# Patient Record
Sex: Male | Born: 1945 | Race: White | Hispanic: No | Marital: Married | Smoking: Former smoker
Health system: Southern US, Community
[De-identification: ages and names within clinical notes are randomized; demographics above are authoritative.]

## PROBLEM LIST (undated history)

## (undated) DIAGNOSIS — C679 Malignant neoplasm of bladder, unspecified: Secondary | ICD-10-CM

## (undated) DIAGNOSIS — G9340 Encephalopathy, unspecified: Secondary | ICD-10-CM

## (undated) DIAGNOSIS — E871 Hypo-osmolality and hyponatremia: Secondary | ICD-10-CM

## (undated) HISTORY — DX: Malignant neoplasm of bladder, unspecified: C67.9

## (undated) HISTORY — PX: TRANSURETHRAL RESECTION OF BLADDER TUMOR: SHX2575

---

## 2012-09-18 ENCOUNTER — Emergency Department: Payer: Self-pay | Admitting: Unknown Physician Specialty

## 2012-09-18 DIAGNOSIS — N134 Hydroureter: Secondary | ICD-10-CM | POA: Diagnosis not present

## 2012-09-18 DIAGNOSIS — R109 Unspecified abdominal pain: Secondary | ICD-10-CM | POA: Diagnosis not present

## 2012-09-18 DIAGNOSIS — F17200 Nicotine dependence, unspecified, uncomplicated: Secondary | ICD-10-CM | POA: Diagnosis not present

## 2012-09-18 DIAGNOSIS — C679 Malignant neoplasm of bladder, unspecified: Secondary | ICD-10-CM | POA: Diagnosis not present

## 2012-09-18 DIAGNOSIS — R319 Hematuria, unspecified: Secondary | ICD-10-CM | POA: Diagnosis not present

## 2012-09-18 LAB — COMPREHENSIVE METABOLIC PANEL
Albumin: 3.7 g/dL (ref 3.4–5.0)
Anion Gap: 11 (ref 7–16)
Calcium, Total: 8.9 mg/dL (ref 8.5–10.1)
Co2: 26 mmol/L (ref 21–32)
Creatinine: 2 mg/dL — ABNORMAL HIGH (ref 0.60–1.30)
EGFR (African American): 39 — ABNORMAL LOW
EGFR (Non-African Amer.): 34 — ABNORMAL LOW
Glucose: 111 mg/dL — ABNORMAL HIGH (ref 65–99)
Osmolality: 285 (ref 275–301)
Potassium: 3.7 mmol/L (ref 3.5–5.1)
SGOT(AST): 19 U/L (ref 15–37)
SGPT (ALT): 24 U/L (ref 12–78)
Sodium: 141 mmol/L (ref 136–145)

## 2012-09-18 LAB — URINALYSIS, COMPLETE
Bilirubin,UR: NEGATIVE
Glucose,UR: NEGATIVE mg/dL (ref 0–75)
Ketone: NEGATIVE
Nitrite: NEGATIVE
Protein: 75
Specific Gravity: 1.03 (ref 1.003–1.030)
Squamous Epithelial: 1
WBC UR: 72 /HPF (ref 0–5)

## 2012-09-18 LAB — LIPASE, BLOOD: Lipase: 72 U/L — ABNORMAL LOW (ref 73–393)

## 2012-09-18 LAB — CBC
HGB: 12.5 g/dL — ABNORMAL LOW (ref 13.0–18.0)
MCH: 30.7 pg (ref 26.0–34.0)
MCHC: 34.5 g/dL (ref 32.0–36.0)
MCV: 89 fL (ref 80–100)
Platelet: 396 10*3/uL (ref 150–440)
RDW: 14.2 % (ref 11.5–14.5)
WBC: 12 10*3/uL — ABNORMAL HIGH (ref 3.8–10.6)

## 2012-09-20 LAB — URINE CULTURE

## 2012-09-28 DIAGNOSIS — N1330 Unspecified hydronephrosis: Secondary | ICD-10-CM | POA: Diagnosis not present

## 2012-09-28 DIAGNOSIS — C679 Malignant neoplasm of bladder, unspecified: Secondary | ICD-10-CM | POA: Diagnosis not present

## 2012-09-28 DIAGNOSIS — R310 Gross hematuria: Secondary | ICD-10-CM | POA: Diagnosis not present

## 2012-10-07 ENCOUNTER — Ambulatory Visit: Payer: Self-pay | Admitting: Urology

## 2012-10-07 DIAGNOSIS — Z01812 Encounter for preprocedural laboratory examination: Secondary | ICD-10-CM | POA: Diagnosis not present

## 2012-10-07 DIAGNOSIS — C679 Malignant neoplasm of bladder, unspecified: Secondary | ICD-10-CM | POA: Diagnosis not present

## 2012-10-07 DIAGNOSIS — N1330 Unspecified hydronephrosis: Secondary | ICD-10-CM | POA: Diagnosis not present

## 2012-10-07 DIAGNOSIS — R310 Gross hematuria: Secondary | ICD-10-CM | POA: Diagnosis not present

## 2012-10-07 DIAGNOSIS — Z01810 Encounter for preprocedural cardiovascular examination: Secondary | ICD-10-CM | POA: Diagnosis not present

## 2012-10-13 ENCOUNTER — Ambulatory Visit: Payer: Self-pay | Admitting: Urology

## 2012-10-13 DIAGNOSIS — F17200 Nicotine dependence, unspecified, uncomplicated: Secondary | ICD-10-CM | POA: Diagnosis not present

## 2012-10-13 DIAGNOSIS — Z8249 Family history of ischemic heart disease and other diseases of the circulatory system: Secondary | ICD-10-CM | POA: Diagnosis not present

## 2012-10-13 DIAGNOSIS — N1330 Unspecified hydronephrosis: Secondary | ICD-10-CM | POA: Diagnosis not present

## 2012-10-13 DIAGNOSIS — C679 Malignant neoplasm of bladder, unspecified: Secondary | ICD-10-CM | POA: Diagnosis not present

## 2012-10-13 DIAGNOSIS — Z79899 Other long term (current) drug therapy: Secondary | ICD-10-CM | POA: Diagnosis not present

## 2012-10-13 DIAGNOSIS — C672 Malignant neoplasm of lateral wall of bladder: Secondary | ICD-10-CM | POA: Diagnosis not present

## 2012-10-13 DIAGNOSIS — D494 Neoplasm of unspecified behavior of bladder: Secondary | ICD-10-CM | POA: Diagnosis not present

## 2012-10-13 DIAGNOSIS — Z8052 Family history of malignant neoplasm of bladder: Secondary | ICD-10-CM | POA: Diagnosis not present

## 2012-10-14 DIAGNOSIS — C679 Malignant neoplasm of bladder, unspecified: Secondary | ICD-10-CM | POA: Diagnosis not present

## 2012-10-18 ENCOUNTER — Ambulatory Visit: Payer: Self-pay | Admitting: Urology

## 2012-10-18 DIAGNOSIS — C679 Malignant neoplasm of bladder, unspecified: Secondary | ICD-10-CM | POA: Diagnosis not present

## 2012-10-18 DIAGNOSIS — K8020 Calculus of gallbladder without cholecystitis without obstruction: Secondary | ICD-10-CM | POA: Diagnosis not present

## 2012-10-18 DIAGNOSIS — R935 Abnormal findings on diagnostic imaging of other abdominal regions, including retroperitoneum: Secondary | ICD-10-CM | POA: Diagnosis not present

## 2012-10-18 DIAGNOSIS — N1330 Unspecified hydronephrosis: Secondary | ICD-10-CM | POA: Diagnosis not present

## 2012-10-19 DIAGNOSIS — C679 Malignant neoplasm of bladder, unspecified: Secondary | ICD-10-CM | POA: Diagnosis not present

## 2012-10-29 DIAGNOSIS — C679 Malignant neoplasm of bladder, unspecified: Secondary | ICD-10-CM | POA: Diagnosis not present

## 2012-10-29 DIAGNOSIS — N1330 Unspecified hydronephrosis: Secondary | ICD-10-CM | POA: Diagnosis not present

## 2012-11-02 ENCOUNTER — Ambulatory Visit: Payer: Self-pay | Admitting: Urology

## 2012-11-02 DIAGNOSIS — C679 Malignant neoplasm of bladder, unspecified: Secondary | ICD-10-CM | POA: Diagnosis not present

## 2012-11-18 DIAGNOSIS — N134 Hydroureter: Secondary | ICD-10-CM | POA: Diagnosis not present

## 2012-11-18 DIAGNOSIS — Z79899 Other long term (current) drug therapy: Secondary | ICD-10-CM | POA: Diagnosis not present

## 2012-11-18 DIAGNOSIS — J9819 Other pulmonary collapse: Secondary | ICD-10-CM | POA: Diagnosis not present

## 2012-11-18 DIAGNOSIS — C679 Malignant neoplasm of bladder, unspecified: Secondary | ICD-10-CM | POA: Diagnosis not present

## 2012-11-18 DIAGNOSIS — N1330 Unspecified hydronephrosis: Secondary | ICD-10-CM | POA: Diagnosis not present

## 2012-11-18 LAB — PATHOLOGY REPORT

## 2012-11-22 ENCOUNTER — Ambulatory Visit: Payer: Self-pay | Admitting: Oncology

## 2012-11-22 DIAGNOSIS — R634 Abnormal weight loss: Secondary | ICD-10-CM | POA: Diagnosis not present

## 2012-11-22 DIAGNOSIS — C679 Malignant neoplasm of bladder, unspecified: Secondary | ICD-10-CM | POA: Diagnosis not present

## 2012-11-22 DIAGNOSIS — R630 Anorexia: Secondary | ICD-10-CM | POA: Diagnosis not present

## 2012-11-22 DIAGNOSIS — F17200 Nicotine dependence, unspecified, uncomplicated: Secondary | ICD-10-CM | POA: Diagnosis not present

## 2012-11-22 DIAGNOSIS — Z79899 Other long term (current) drug therapy: Secondary | ICD-10-CM | POA: Diagnosis not present

## 2012-11-24 ENCOUNTER — Ambulatory Visit: Payer: Self-pay | Admitting: Oncology

## 2012-11-24 DIAGNOSIS — F17200 Nicotine dependence, unspecified, uncomplicated: Secondary | ICD-10-CM | POA: Diagnosis not present

## 2012-11-24 DIAGNOSIS — R634 Abnormal weight loss: Secondary | ICD-10-CM | POA: Diagnosis not present

## 2012-11-24 DIAGNOSIS — R630 Anorexia: Secondary | ICD-10-CM | POA: Diagnosis not present

## 2012-11-24 DIAGNOSIS — Z79899 Other long term (current) drug therapy: Secondary | ICD-10-CM | POA: Diagnosis not present

## 2012-11-24 DIAGNOSIS — C679 Malignant neoplasm of bladder, unspecified: Secondary | ICD-10-CM | POA: Diagnosis not present

## 2012-11-25 DIAGNOSIS — C679 Malignant neoplasm of bladder, unspecified: Secondary | ICD-10-CM | POA: Diagnosis not present

## 2012-12-06 ENCOUNTER — Ambulatory Visit: Payer: Self-pay | Admitting: Urology

## 2012-12-06 DIAGNOSIS — F17200 Nicotine dependence, unspecified, uncomplicated: Secondary | ICD-10-CM | POA: Diagnosis not present

## 2012-12-06 DIAGNOSIS — Z79899 Other long term (current) drug therapy: Secondary | ICD-10-CM | POA: Diagnosis not present

## 2012-12-06 DIAGNOSIS — N1330 Unspecified hydronephrosis: Secondary | ICD-10-CM | POA: Diagnosis not present

## 2012-12-06 DIAGNOSIS — C679 Malignant neoplasm of bladder, unspecified: Secondary | ICD-10-CM | POA: Diagnosis not present

## 2012-12-06 LAB — APTT: Activated PTT: 31.7 secs (ref 23.6–35.9)

## 2012-12-06 LAB — PROTIME-INR: INR: 0.9

## 2012-12-15 ENCOUNTER — Ambulatory Visit: Payer: Self-pay | Admitting: Oncology

## 2012-12-15 DIAGNOSIS — G893 Neoplasm related pain (acute) (chronic): Secondary | ICD-10-CM | POA: Diagnosis not present

## 2012-12-15 DIAGNOSIS — C679 Malignant neoplasm of bladder, unspecified: Secondary | ICD-10-CM | POA: Diagnosis not present

## 2012-12-15 DIAGNOSIS — Z936 Other artificial openings of urinary tract status: Secondary | ICD-10-CM | POA: Diagnosis not present

## 2012-12-15 DIAGNOSIS — Z23 Encounter for immunization: Secondary | ICD-10-CM | POA: Diagnosis not present

## 2012-12-15 DIAGNOSIS — N289 Disorder of kidney and ureter, unspecified: Secondary | ICD-10-CM | POA: Diagnosis not present

## 2012-12-15 DIAGNOSIS — Z5111 Encounter for antineoplastic chemotherapy: Secondary | ICD-10-CM | POA: Diagnosis not present

## 2012-12-15 DIAGNOSIS — Z79899 Other long term (current) drug therapy: Secondary | ICD-10-CM | POA: Diagnosis not present

## 2012-12-23 DIAGNOSIS — G893 Neoplasm related pain (acute) (chronic): Secondary | ICD-10-CM | POA: Diagnosis not present

## 2012-12-23 DIAGNOSIS — C679 Malignant neoplasm of bladder, unspecified: Secondary | ICD-10-CM | POA: Diagnosis not present

## 2012-12-23 LAB — CBC CANCER CENTER
Basophil #: 0.1 x10 3/mm (ref 0.0–0.1)
Eosinophil %: 2.5 %
Lymphocyte #: 1.3 x10 3/mm (ref 1.0–3.6)
Lymphocyte %: 10.5 %
MCHC: 34 g/dL (ref 32.0–36.0)
MCV: 87 fL (ref 80–100)
Monocyte %: 6.8 %
Neutrophil #: 9.7 x10 3/mm — ABNORMAL HIGH (ref 1.4–6.5)
Neutrophil %: 79.2 %
Platelet: 534 x10 3/mm — ABNORMAL HIGH (ref 150–440)
RBC: 3.87 10*6/uL — ABNORMAL LOW (ref 4.40–5.90)
RDW: 14.4 % (ref 11.5–14.5)

## 2012-12-23 LAB — COMPREHENSIVE METABOLIC PANEL
Anion Gap: 9 (ref 7–16)
BUN: 18 mg/dL (ref 7–18)
Calcium, Total: 9.3 mg/dL (ref 8.5–10.1)
Chloride: 99 mmol/L (ref 98–107)
Co2: 28 mmol/L (ref 21–32)
Creatinine: 1.58 mg/dL — ABNORMAL HIGH (ref 0.60–1.30)
EGFR (African American): 52 — ABNORMAL LOW
Potassium: 4.2 mmol/L (ref 3.5–5.1)
SGPT (ALT): 22 U/L (ref 12–78)
Sodium: 136 mmol/L (ref 136–145)
Total Protein: 7.7 g/dL (ref 6.4–8.2)

## 2012-12-29 DIAGNOSIS — C679 Malignant neoplasm of bladder, unspecified: Secondary | ICD-10-CM | POA: Diagnosis not present

## 2012-12-29 DIAGNOSIS — G893 Neoplasm related pain (acute) (chronic): Secondary | ICD-10-CM | POA: Diagnosis not present

## 2012-12-29 LAB — BASIC METABOLIC PANEL
BUN: 22 mg/dL — ABNORMAL HIGH (ref 7–18)
Calcium, Total: 9.6 mg/dL (ref 8.5–10.1)
Chloride: 99 mmol/L (ref 98–107)
Creatinine: 1.68 mg/dL — ABNORMAL HIGH (ref 0.60–1.30)
EGFR (Non-African Amer.): 42 — ABNORMAL LOW
Glucose: 111 mg/dL — ABNORMAL HIGH (ref 65–99)
Potassium: 3.7 mmol/L (ref 3.5–5.1)
Sodium: 137 mmol/L (ref 136–145)

## 2013-01-05 DIAGNOSIS — N289 Disorder of kidney and ureter, unspecified: Secondary | ICD-10-CM | POA: Diagnosis not present

## 2013-01-05 DIAGNOSIS — C679 Malignant neoplasm of bladder, unspecified: Secondary | ICD-10-CM | POA: Diagnosis not present

## 2013-01-05 LAB — COMPREHENSIVE METABOLIC PANEL
Anion Gap: 8 (ref 7–16)
BUN: 23 mg/dL — ABNORMAL HIGH (ref 7–18)
Calcium, Total: 9.5 mg/dL (ref 8.5–10.1)
Chloride: 99 mmol/L (ref 98–107)
Co2: 28 mmol/L (ref 21–32)
Creatinine: 1.53 mg/dL — ABNORMAL HIGH (ref 0.60–1.30)
Glucose: 120 mg/dL — ABNORMAL HIGH (ref 65–99)
Osmolality: 275 (ref 275–301)
Potassium: 4 mmol/L (ref 3.5–5.1)
SGOT(AST): 12 U/L — ABNORMAL LOW (ref 15–37)
SGPT (ALT): 19 U/L (ref 12–78)

## 2013-01-05 LAB — CBC CANCER CENTER
Basophil #: 0.1 x10 3/mm (ref 0.0–0.1)
Eosinophil %: 4.7 %
HGB: 10.4 g/dL — ABNORMAL LOW (ref 13.0–18.0)
Lymphocyte #: 1.3 x10 3/mm (ref 1.0–3.6)
MCH: 28.2 pg (ref 26.0–34.0)
MCHC: 32.7 g/dL (ref 32.0–36.0)
MCV: 86 fL (ref 80–100)
Monocyte %: 6.5 %
Neutrophil #: 6.4 x10 3/mm (ref 1.4–6.5)
Neutrophil %: 72.9 %
Platelet: 343 x10 3/mm (ref 150–440)
RBC: 3.71 10*6/uL — ABNORMAL LOW (ref 4.40–5.90)
WBC: 8.8 x10 3/mm (ref 3.8–10.6)

## 2013-01-15 ENCOUNTER — Ambulatory Visit: Payer: Self-pay | Admitting: Oncology

## 2013-01-15 DIAGNOSIS — C679 Malignant neoplasm of bladder, unspecified: Secondary | ICD-10-CM | POA: Diagnosis not present

## 2013-01-15 DIAGNOSIS — Z936 Other artificial openings of urinary tract status: Secondary | ICD-10-CM | POA: Diagnosis not present

## 2013-01-15 DIAGNOSIS — Z79899 Other long term (current) drug therapy: Secondary | ICD-10-CM | POA: Diagnosis not present

## 2013-01-15 DIAGNOSIS — N289 Disorder of kidney and ureter, unspecified: Secondary | ICD-10-CM | POA: Diagnosis not present

## 2013-01-15 DIAGNOSIS — R630 Anorexia: Secondary | ICD-10-CM | POA: Diagnosis not present

## 2013-01-15 DIAGNOSIS — G893 Neoplasm related pain (acute) (chronic): Secondary | ICD-10-CM | POA: Diagnosis not present

## 2013-01-15 DIAGNOSIS — Z5111 Encounter for antineoplastic chemotherapy: Secondary | ICD-10-CM | POA: Diagnosis not present

## 2013-01-15 DIAGNOSIS — F17200 Nicotine dependence, unspecified, uncomplicated: Secondary | ICD-10-CM | POA: Diagnosis not present

## 2013-01-19 DIAGNOSIS — C679 Malignant neoplasm of bladder, unspecified: Secondary | ICD-10-CM | POA: Diagnosis not present

## 2013-01-19 LAB — BASIC METABOLIC PANEL
Anion Gap: 10 (ref 7–16)
Chloride: 97 mmol/L — ABNORMAL LOW (ref 98–107)
Co2: 28 mmol/L (ref 21–32)
EGFR (African American): 55 — ABNORMAL LOW
EGFR (Non-African Amer.): 48 — ABNORMAL LOW
Osmolality: 275 (ref 275–301)

## 2013-01-19 LAB — CBC CANCER CENTER
Basophil #: 0.1 x10 3/mm (ref 0.0–0.1)
Basophil %: 1.4 %
Eosinophil %: 4.1 %
HCT: 29.4 % — ABNORMAL LOW (ref 40.0–52.0)
HGB: 10.1 g/dL — ABNORMAL LOW (ref 13.0–18.0)
Lymphocyte #: 1.5 x10 3/mm (ref 1.0–3.6)
Lymphocyte %: 18.1 %
Monocyte #: 1 x10 3/mm (ref 0.2–1.0)
Neutrophil #: 5.5 x10 3/mm (ref 1.4–6.5)
Neutrophil %: 64.6 %
Platelet: 778 x10 3/mm — ABNORMAL HIGH (ref 150–440)
RDW: 14.6 % — ABNORMAL HIGH (ref 11.5–14.5)
WBC: 8.4 x10 3/mm (ref 3.8–10.6)

## 2013-02-02 LAB — COMPREHENSIVE METABOLIC PANEL
Alkaline Phosphatase: 110 U/L (ref 50–136)
Anion Gap: 9 (ref 7–16)
Bilirubin,Total: 0.2 mg/dL (ref 0.2–1.0)
Creatinine: 1.47 mg/dL — ABNORMAL HIGH (ref 0.60–1.30)
EGFR (African American): 57 — ABNORMAL LOW
Glucose: 131 mg/dL — ABNORMAL HIGH (ref 65–99)
Potassium: 4.3 mmol/L (ref 3.5–5.1)
SGPT (ALT): 16 U/L (ref 12–78)
Sodium: 134 mmol/L — ABNORMAL LOW (ref 136–145)
Total Protein: 7.7 g/dL (ref 6.4–8.2)

## 2013-02-02 LAB — CBC CANCER CENTER
Basophil #: 0.2 x10 3/mm — ABNORMAL HIGH (ref 0.0–0.1)
Basophil %: 1.3 %
Eosinophil %: 3.6 %
HCT: 32.4 % — ABNORMAL LOW (ref 40.0–52.0)
HGB: 10.7 g/dL — ABNORMAL LOW (ref 13.0–18.0)
Lymphocyte #: 2 x10 3/mm (ref 1.0–3.6)
Lymphocyte %: 13.4 %
MCH: 28 pg (ref 26.0–34.0)
MCHC: 32.9 g/dL (ref 32.0–36.0)
MCV: 85 fL (ref 80–100)
Monocyte #: 1.1 x10 3/mm — ABNORMAL HIGH (ref 0.2–1.0)
Neutrophil #: 10.8 x10 3/mm — ABNORMAL HIGH (ref 1.4–6.5)
RBC: 3.8 10*6/uL — ABNORMAL LOW (ref 4.40–5.90)
RDW: 15.6 % — ABNORMAL HIGH (ref 11.5–14.5)
WBC: 14.6 x10 3/mm — ABNORMAL HIGH (ref 3.8–10.6)

## 2013-02-09 DIAGNOSIS — C679 Malignant neoplasm of bladder, unspecified: Secondary | ICD-10-CM | POA: Diagnosis not present

## 2013-02-09 LAB — BASIC METABOLIC PANEL
Anion Gap: 10 (ref 7–16)
BUN: 19 mg/dL — ABNORMAL HIGH (ref 7–18)
Creatinine: 1.41 mg/dL — ABNORMAL HIGH (ref 0.60–1.30)
EGFR (Non-African Amer.): 52 — ABNORMAL LOW
Glucose: 126 mg/dL — ABNORMAL HIGH (ref 65–99)
Osmolality: 272 (ref 275–301)
Potassium: 4.2 mmol/L (ref 3.5–5.1)
Sodium: 134 mmol/L — ABNORMAL LOW (ref 136–145)

## 2013-02-09 LAB — CBC CANCER CENTER
Basophil #: 0.1 x10 3/mm (ref 0.0–0.1)
Eosinophil #: 0.6 x10 3/mm (ref 0.0–0.7)
Eosinophil %: 4.8 %
HGB: 10 g/dL — ABNORMAL LOW (ref 13.0–18.0)
Lymphocyte #: 1.6 x10 3/mm (ref 1.0–3.6)
MCH: 28.3 pg (ref 26.0–34.0)
MCHC: 33.1 g/dL (ref 32.0–36.0)
Monocyte %: 6.5 %
Neutrophil #: 9.9 x10 3/mm — ABNORMAL HIGH (ref 1.4–6.5)
Platelet: 384 x10 3/mm (ref 150–440)
RDW: 15.2 % — ABNORMAL HIGH (ref 11.5–14.5)

## 2013-02-12 ENCOUNTER — Ambulatory Visit: Payer: Self-pay | Admitting: Oncology

## 2013-02-12 DIAGNOSIS — G893 Neoplasm related pain (acute) (chronic): Secondary | ICD-10-CM | POA: Diagnosis not present

## 2013-02-12 DIAGNOSIS — C679 Malignant neoplasm of bladder, unspecified: Secondary | ICD-10-CM | POA: Diagnosis not present

## 2013-02-12 DIAGNOSIS — Z936 Other artificial openings of urinary tract status: Secondary | ICD-10-CM | POA: Diagnosis not present

## 2013-02-12 DIAGNOSIS — Z5111 Encounter for antineoplastic chemotherapy: Secondary | ICD-10-CM | POA: Diagnosis not present

## 2013-02-12 DIAGNOSIS — N289 Disorder of kidney and ureter, unspecified: Secondary | ICD-10-CM | POA: Diagnosis not present

## 2013-02-12 DIAGNOSIS — F17200 Nicotine dependence, unspecified, uncomplicated: Secondary | ICD-10-CM | POA: Diagnosis not present

## 2013-02-12 DIAGNOSIS — Z79899 Other long term (current) drug therapy: Secondary | ICD-10-CM | POA: Diagnosis not present

## 2013-02-16 LAB — CBC CANCER CENTER
Basophil %: 2.6 %
Eosinophil #: 0.4 x10 3/mm (ref 0.0–0.7)
HCT: 27.5 % — ABNORMAL LOW (ref 40.0–52.0)
HGB: 9.4 g/dL — ABNORMAL LOW (ref 13.0–18.0)
Lymphocyte %: 19.2 %
MCH: 29.2 pg (ref 26.0–34.0)
MCHC: 34.1 g/dL (ref 32.0–36.0)
Monocyte #: 0.5 x10 3/mm (ref 0.2–1.0)
Neutrophil #: 5 x10 3/mm (ref 1.4–6.5)
RDW: 15.7 % — ABNORMAL HIGH (ref 11.5–14.5)

## 2013-02-16 LAB — BASIC METABOLIC PANEL
Calcium, Total: 9.5 mg/dL (ref 8.5–10.1)
Chloride: 96 mmol/L — ABNORMAL LOW (ref 98–107)
Creatinine: 1.4 mg/dL — ABNORMAL HIGH (ref 0.60–1.30)
EGFR (African American): >60
EGFR (Non-African Amer.): 52 — ABNORMAL LOW
Osmolality: 272 (ref 275–301)
Potassium: 4.2 mmol/L (ref 3.5–5.1)
Sodium: 134 mmol/L — ABNORMAL LOW (ref 136–145)

## 2013-03-02 LAB — COMPREHENSIVE METABOLIC PANEL
Albumin: 3 g/dL — ABNORMAL LOW (ref 3.4–5.0)
Anion Gap: 9 (ref 7–16)
BUN: 12 mg/dL (ref 7–18)
Calcium, Total: 9.4 mg/dL (ref 8.5–10.1)
Creatinine: 1.52 mg/dL — ABNORMAL HIGH (ref 0.60–1.30)
EGFR (African American): 55 — ABNORMAL LOW
Glucose: 116 mg/dL — ABNORMAL HIGH (ref 65–99)
Osmolality: 273 (ref 275–301)
Potassium: 4.2 mmol/L (ref 3.5–5.1)
SGOT(AST): 13 U/L — ABNORMAL LOW (ref 15–37)
SGPT (ALT): 15 U/L (ref 12–78)
Sodium: 136 mmol/L (ref 136–145)

## 2013-03-02 LAB — CBC CANCER CENTER
Basophil #: 0.1 x10 3/mm (ref 0.0–0.1)
Eosinophil #: 0.3 x10 3/mm (ref 0.0–0.7)
Eosinophil %: 4.1 %
HCT: 27.9 % — ABNORMAL LOW (ref 40.0–52.0)
HGB: 9.4 g/dL — ABNORMAL LOW (ref 13.0–18.0)
Lymphocyte %: 15.3 %
MCHC: 33.6 g/dL (ref 32.0–36.0)
Monocyte %: 13.1 %
Neutrophil #: 5.2 x10 3/mm (ref 1.4–6.5)
Platelet: 754 x10 3/mm — ABNORMAL HIGH (ref 150–440)
RBC: 3.2 10*6/uL — ABNORMAL LOW (ref 4.40–5.90)

## 2013-03-15 ENCOUNTER — Ambulatory Visit: Payer: Self-pay | Admitting: Oncology

## 2013-03-15 DIAGNOSIS — N289 Disorder of kidney and ureter, unspecified: Secondary | ICD-10-CM | POA: Diagnosis not present

## 2013-03-15 DIAGNOSIS — Z5111 Encounter for antineoplastic chemotherapy: Secondary | ICD-10-CM | POA: Diagnosis not present

## 2013-03-15 DIAGNOSIS — F17200 Nicotine dependence, unspecified, uncomplicated: Secondary | ICD-10-CM | POA: Diagnosis not present

## 2013-03-15 DIAGNOSIS — D494 Neoplasm of unspecified behavior of bladder: Secondary | ICD-10-CM | POA: Diagnosis not present

## 2013-03-15 DIAGNOSIS — Z936 Other artificial openings of urinary tract status: Secondary | ICD-10-CM | POA: Diagnosis not present

## 2013-03-15 DIAGNOSIS — G893 Neoplasm related pain (acute) (chronic): Secondary | ICD-10-CM | POA: Diagnosis not present

## 2013-03-15 DIAGNOSIS — C679 Malignant neoplasm of bladder, unspecified: Secondary | ICD-10-CM | POA: Diagnosis not present

## 2013-03-15 DIAGNOSIS — Z79899 Other long term (current) drug therapy: Secondary | ICD-10-CM | POA: Diagnosis not present

## 2013-03-16 DIAGNOSIS — G893 Neoplasm related pain (acute) (chronic): Secondary | ICD-10-CM | POA: Diagnosis not present

## 2013-03-16 DIAGNOSIS — N289 Disorder of kidney and ureter, unspecified: Secondary | ICD-10-CM | POA: Diagnosis not present

## 2013-03-16 DIAGNOSIS — C679 Malignant neoplasm of bladder, unspecified: Secondary | ICD-10-CM | POA: Diagnosis not present

## 2013-03-16 LAB — BASIC METABOLIC PANEL
Anion Gap: 8 (ref 7–16)
BUN: 9 mg/dL (ref 7–18)
Calcium, Total: 9.2 mg/dL (ref 8.5–10.1)
Chloride: 97 mmol/L — ABNORMAL LOW (ref 98–107)
Co2: 29 mmol/L (ref 21–32)
Creatinine: 1.45 mg/dL — ABNORMAL HIGH (ref 0.60–1.30)
EGFR (Non-African Amer.): 50 — ABNORMAL LOW
Glucose: 119 mg/dL — ABNORMAL HIGH (ref 65–99)
Osmolality: 268 (ref 275–301)
Sodium: 134 mmol/L — ABNORMAL LOW (ref 136–145)

## 2013-03-16 LAB — CBC CANCER CENTER
Basophil #: 0.1 x10 3/mm (ref 0.0–0.1)
Basophil %: 0.9 %
Eosinophil %: 3.6 %
HCT: 29.2 % — ABNORMAL LOW (ref 40.0–52.0)
HGB: 9.5 g/dL — ABNORMAL LOW (ref 13.0–18.0)
MCH: 28 pg (ref 26.0–34.0)
MCHC: 32.4 g/dL (ref 32.0–36.0)
MCV: 87 fL (ref 80–100)
Monocyte #: 1 x10 3/mm (ref 0.2–1.0)
Monocyte %: 9.8 %
Neutrophil #: 7.8 x10 3/mm — ABNORMAL HIGH (ref 1.4–6.5)
Neutrophil %: 75.1 %
Platelet: 414 x10 3/mm (ref 150–440)
RBC: 3.38 10*6/uL — ABNORMAL LOW (ref 4.40–5.90)
WBC: 10.4 x10 3/mm (ref 3.8–10.6)

## 2013-03-30 DIAGNOSIS — C679 Malignant neoplasm of bladder, unspecified: Secondary | ICD-10-CM | POA: Diagnosis not present

## 2013-03-30 DIAGNOSIS — G893 Neoplasm related pain (acute) (chronic): Secondary | ICD-10-CM | POA: Diagnosis not present

## 2013-03-30 LAB — COMPREHENSIVE METABOLIC PANEL
Anion Gap: 7 (ref 7–16)
Bilirubin,Total: 0.3 mg/dL (ref 0.2–1.0)
Calcium, Total: 8.7 mg/dL (ref 8.5–10.1)
Chloride: 99 mmol/L (ref 98–107)
Creatinine: 1.32 mg/dL — ABNORMAL HIGH (ref 0.60–1.30)
EGFR (Non-African Amer.): 56 — ABNORMAL LOW
Glucose: 114 mg/dL — ABNORMAL HIGH (ref 65–99)
Osmolality: 271 (ref 275–301)
Potassium: 4.2 mmol/L (ref 3.5–5.1)
SGOT(AST): 14 U/L — ABNORMAL LOW (ref 15–37)
SGPT (ALT): 18 U/L (ref 12–78)
Sodium: 135 mmol/L — ABNORMAL LOW (ref 136–145)

## 2013-03-30 LAB — CBC CANCER CENTER
Basophil #: 0.2 x10 3/mm — ABNORMAL HIGH (ref 0.0–0.1)
Eosinophil %: 14.3 %
HCT: 28.9 % — ABNORMAL LOW (ref 40.0–52.0)
HGB: 9.3 g/dL — ABNORMAL LOW (ref 13.0–18.0)
MCHC: 32.2 g/dL (ref 32.0–36.0)
MCV: 88 fL (ref 80–100)
Monocyte %: 14.4 %
Neutrophil %: 46.1 %
Platelet: 303 x10 3/mm (ref 150–440)

## 2013-04-06 DIAGNOSIS — C679 Malignant neoplasm of bladder, unspecified: Secondary | ICD-10-CM | POA: Diagnosis not present

## 2013-04-06 DIAGNOSIS — G893 Neoplasm related pain (acute) (chronic): Secondary | ICD-10-CM | POA: Diagnosis not present

## 2013-04-06 LAB — COMPREHENSIVE METABOLIC PANEL
Albumin: 3.2 g/dL — ABNORMAL LOW (ref 3.4–5.0)
Alkaline Phosphatase: 104 U/L (ref 50–136)
BUN: 14 mg/dL (ref 7–18)
Bilirubin,Total: 0.4 mg/dL (ref 0.2–1.0)
Calcium, Total: 9.2 mg/dL (ref 8.5–10.1)
Chloride: 100 mmol/L (ref 98–107)
Co2: 29 mmol/L (ref 21–32)
Creatinine: 1.37 mg/dL — ABNORMAL HIGH (ref 0.60–1.30)
EGFR (African American): >60
EGFR (Non-African Amer.): 54 — ABNORMAL LOW
Glucose: 113 mg/dL — ABNORMAL HIGH (ref 65–99)
Potassium: 4.2 mmol/L (ref 3.5–5.1)
SGPT (ALT): 16 U/L (ref 12–78)
Total Protein: 7 g/dL (ref 6.4–8.2)

## 2013-04-06 LAB — CBC CANCER CENTER
Basophil #: 0 x10 3/mm (ref 0.0–0.1)
Basophil %: 0.4 %
Eosinophil #: 0.5 x10 3/mm (ref 0.0–0.7)
Eosinophil %: 6.2 %
HCT: 28.8 % — ABNORMAL LOW (ref 40.0–52.0)
HGB: 9.3 g/dL — ABNORMAL LOW (ref 13.0–18.0)
Lymphocyte #: 1.4 x10 3/mm (ref 1.0–3.6)
MCHC: 32.4 g/dL (ref 32.0–36.0)
Monocyte #: 0.7 x10 3/mm (ref 0.2–1.0)
Neutrophil #: 4.9 x10 3/mm (ref 1.4–6.5)
Neutrophil %: 65.5 %
Platelet: 260 x10 3/mm (ref 150–440)
RBC: 3.27 10*6/uL — ABNORMAL LOW (ref 4.40–5.90)
RDW: 19.8 % — ABNORMAL HIGH (ref 11.5–14.5)
WBC: 7.4 x10 3/mm (ref 3.8–10.6)

## 2013-04-13 LAB — CBC CANCER CENTER
Basophil #: 0 x10 3/mm (ref 0.0–0.1)
Basophil %: 0.8 %
Eosinophil #: 0.7 x10 3/mm (ref 0.0–0.7)
Eosinophil %: 12.4 %
HCT: 28.4 % — ABNORMAL LOW (ref 40.0–52.0)
HGB: 9.4 g/dL — ABNORMAL LOW (ref 13.0–18.0)
Lymphocyte #: 1.2 x10 3/mm (ref 1.0–3.6)
Lymphocyte %: 22.3 %
MCHC: 33.1 g/dL (ref 32.0–36.0)
RBC: 3.22 10*6/uL — ABNORMAL LOW (ref 4.40–5.90)

## 2013-04-13 LAB — COMPREHENSIVE METABOLIC PANEL
Alkaline Phosphatase: 118 U/L (ref 50–136)
Anion Gap: 9 (ref 7–16)
BUN: 19 mg/dL — ABNORMAL HIGH (ref 7–18)
Calcium, Total: 9 mg/dL (ref 8.5–10.1)
Chloride: 100 mmol/L (ref 98–107)
Co2: 28 mmol/L (ref 21–32)
Creatinine: 1.25 mg/dL (ref 0.60–1.30)
EGFR (Non-African Amer.): >60
Glucose: 107 mg/dL — ABNORMAL HIGH (ref 65–99)
Osmolality: 277 (ref 275–301)
SGOT(AST): 19 U/L (ref 15–37)
Total Protein: 7 g/dL (ref 6.4–8.2)

## 2013-04-14 ENCOUNTER — Ambulatory Visit: Payer: Self-pay | Admitting: Oncology

## 2013-04-14 DIAGNOSIS — C679 Malignant neoplasm of bladder, unspecified: Secondary | ICD-10-CM | POA: Diagnosis not present

## 2013-04-14 DIAGNOSIS — K8020 Calculus of gallbladder without cholecystitis without obstruction: Secondary | ICD-10-CM | POA: Diagnosis not present

## 2013-04-14 DIAGNOSIS — N1330 Unspecified hydronephrosis: Secondary | ICD-10-CM | POA: Diagnosis not present

## 2013-04-21 DIAGNOSIS — Z79899 Other long term (current) drug therapy: Secondary | ICD-10-CM | POA: Diagnosis not present

## 2013-04-21 DIAGNOSIS — N134 Hydroureter: Secondary | ICD-10-CM | POA: Diagnosis not present

## 2013-04-21 DIAGNOSIS — N1330 Unspecified hydronephrosis: Secondary | ICD-10-CM | POA: Diagnosis not present

## 2013-04-21 DIAGNOSIS — C679 Malignant neoplasm of bladder, unspecified: Secondary | ICD-10-CM | POA: Diagnosis not present

## 2013-04-28 ENCOUNTER — Ambulatory Visit: Payer: Self-pay

## 2013-04-28 DIAGNOSIS — K5900 Constipation, unspecified: Secondary | ICD-10-CM | POA: Diagnosis not present

## 2013-04-28 DIAGNOSIS — K8020 Calculus of gallbladder without cholecystitis without obstruction: Secondary | ICD-10-CM | POA: Diagnosis not present

## 2013-04-28 DIAGNOSIS — R918 Other nonspecific abnormal finding of lung field: Secondary | ICD-10-CM | POA: Diagnosis not present

## 2013-04-28 DIAGNOSIS — C679 Malignant neoplasm of bladder, unspecified: Secondary | ICD-10-CM | POA: Diagnosis not present

## 2013-05-02 ENCOUNTER — Emergency Department: Payer: Self-pay | Admitting: Emergency Medicine

## 2013-05-02 DIAGNOSIS — Z79899 Other long term (current) drug therapy: Secondary | ICD-10-CM | POA: Diagnosis not present

## 2013-05-02 DIAGNOSIS — N390 Urinary tract infection, site not specified: Secondary | ICD-10-CM | POA: Diagnosis not present

## 2013-05-02 DIAGNOSIS — R339 Retention of urine, unspecified: Secondary | ICD-10-CM | POA: Diagnosis not present

## 2013-05-02 LAB — URINALYSIS, COMPLETE
Glucose,UR: NEGATIVE mg/dL (ref 0–75)
Ketone: NEGATIVE
Protein: 100
RBC,UR: 158 /HPF (ref 0–5)
WBC UR: 5274 /HPF (ref 0–5)

## 2013-05-04 DIAGNOSIS — M47814 Spondylosis without myelopathy or radiculopathy, thoracic region: Secondary | ICD-10-CM | POA: Diagnosis not present

## 2013-05-04 DIAGNOSIS — Z01818 Encounter for other preprocedural examination: Secondary | ICD-10-CM | POA: Diagnosis not present

## 2013-05-04 DIAGNOSIS — F17200 Nicotine dependence, unspecified, uncomplicated: Secondary | ICD-10-CM | POA: Diagnosis not present

## 2013-05-04 DIAGNOSIS — Z79899 Other long term (current) drug therapy: Secondary | ICD-10-CM | POA: Diagnosis not present

## 2013-05-04 DIAGNOSIS — Z01812 Encounter for preprocedural laboratory examination: Secondary | ICD-10-CM | POA: Diagnosis not present

## 2013-05-04 DIAGNOSIS — C679 Malignant neoplasm of bladder, unspecified: Secondary | ICD-10-CM | POA: Diagnosis not present

## 2013-05-04 DIAGNOSIS — Z8551 Personal history of malignant neoplasm of bladder: Secondary | ICD-10-CM | POA: Diagnosis not present

## 2013-05-04 DIAGNOSIS — M479 Spondylosis, unspecified: Secondary | ICD-10-CM | POA: Diagnosis not present

## 2013-05-04 DIAGNOSIS — N3289 Other specified disorders of bladder: Secondary | ICD-10-CM | POA: Diagnosis not present

## 2013-05-04 DIAGNOSIS — R9431 Abnormal electrocardiogram [ECG] [EKG]: Secondary | ICD-10-CM | POA: Diagnosis not present

## 2013-05-04 DIAGNOSIS — K8020 Calculus of gallbladder without cholecystitis without obstruction: Secondary | ICD-10-CM | POA: Diagnosis not present

## 2013-05-13 DIAGNOSIS — E871 Hypo-osmolality and hyponatremia: Secondary | ICD-10-CM | POA: Diagnosis present

## 2013-05-13 DIAGNOSIS — C679 Malignant neoplasm of bladder, unspecified: Secondary | ICD-10-CM | POA: Diagnosis not present

## 2013-05-13 DIAGNOSIS — Z9889 Other specified postprocedural states: Secondary | ICD-10-CM | POA: Diagnosis not present

## 2013-05-13 DIAGNOSIS — K6389 Other specified diseases of intestine: Secondary | ICD-10-CM | POA: Diagnosis not present

## 2013-05-13 DIAGNOSIS — R112 Nausea with vomiting, unspecified: Secondary | ICD-10-CM | POA: Diagnosis present

## 2013-05-13 DIAGNOSIS — Z79899 Other long term (current) drug therapy: Secondary | ICD-10-CM | POA: Diagnosis not present

## 2013-05-13 HISTORY — PX: OTHER SURGICAL HISTORY: SHX169

## 2013-05-30 DIAGNOSIS — C679 Malignant neoplasm of bladder, unspecified: Secondary | ICD-10-CM | POA: Diagnosis not present

## 2013-05-30 DIAGNOSIS — Z09 Encounter for follow-up examination after completed treatment for conditions other than malignant neoplasm: Secondary | ICD-10-CM | POA: Diagnosis not present

## 2013-06-07 ENCOUNTER — Ambulatory Visit: Payer: Self-pay | Admitting: Oncology

## 2013-06-07 DIAGNOSIS — D649 Anemia, unspecified: Secondary | ICD-10-CM | POA: Diagnosis not present

## 2013-06-07 DIAGNOSIS — Z8052 Family history of malignant neoplasm of bladder: Secondary | ICD-10-CM | POA: Diagnosis not present

## 2013-06-07 DIAGNOSIS — F17200 Nicotine dependence, unspecified, uncomplicated: Secondary | ICD-10-CM | POA: Diagnosis not present

## 2013-06-07 DIAGNOSIS — Z9221 Personal history of antineoplastic chemotherapy: Secondary | ICD-10-CM | POA: Diagnosis not present

## 2013-06-07 DIAGNOSIS — R52 Pain, unspecified: Secondary | ICD-10-CM | POA: Diagnosis not present

## 2013-06-07 DIAGNOSIS — Z8551 Personal history of malignant neoplasm of bladder: Secondary | ICD-10-CM | POA: Diagnosis not present

## 2013-06-07 DIAGNOSIS — Z936 Other artificial openings of urinary tract status: Secondary | ICD-10-CM | POA: Diagnosis not present

## 2013-06-07 DIAGNOSIS — Z79899 Other long term (current) drug therapy: Secondary | ICD-10-CM | POA: Diagnosis not present

## 2013-06-08 DIAGNOSIS — Z936 Other artificial openings of urinary tract status: Secondary | ICD-10-CM | POA: Diagnosis not present

## 2013-06-08 DIAGNOSIS — D649 Anemia, unspecified: Secondary | ICD-10-CM | POA: Diagnosis not present

## 2013-06-08 DIAGNOSIS — Z8551 Personal history of malignant neoplasm of bladder: Secondary | ICD-10-CM | POA: Diagnosis not present

## 2013-06-08 DIAGNOSIS — Z9221 Personal history of antineoplastic chemotherapy: Secondary | ICD-10-CM | POA: Diagnosis not present

## 2013-06-14 ENCOUNTER — Ambulatory Visit: Payer: Self-pay | Admitting: Oncology

## 2013-06-27 DIAGNOSIS — C679 Malignant neoplasm of bladder, unspecified: Secondary | ICD-10-CM | POA: Diagnosis not present

## 2013-06-27 DIAGNOSIS — N1330 Unspecified hydronephrosis: Secondary | ICD-10-CM | POA: Diagnosis not present

## 2013-07-19 ENCOUNTER — Ambulatory Visit: Payer: Self-pay | Admitting: Oncology

## 2013-07-19 DIAGNOSIS — K5900 Constipation, unspecified: Secondary | ICD-10-CM | POA: Diagnosis not present

## 2013-07-19 DIAGNOSIS — K8020 Calculus of gallbladder without cholecystitis without obstruction: Secondary | ICD-10-CM | POA: Diagnosis not present

## 2013-07-19 DIAGNOSIS — F17200 Nicotine dependence, unspecified, uncomplicated: Secondary | ICD-10-CM | POA: Diagnosis not present

## 2013-07-19 DIAGNOSIS — Z9221 Personal history of antineoplastic chemotherapy: Secondary | ICD-10-CM | POA: Diagnosis not present

## 2013-07-19 DIAGNOSIS — D649 Anemia, unspecified: Secondary | ICD-10-CM | POA: Diagnosis not present

## 2013-07-19 DIAGNOSIS — Z8551 Personal history of malignant neoplasm of bladder: Secondary | ICD-10-CM | POA: Diagnosis not present

## 2013-07-19 DIAGNOSIS — Z79899 Other long term (current) drug therapy: Secondary | ICD-10-CM | POA: Diagnosis not present

## 2013-07-19 DIAGNOSIS — Z936 Other artificial openings of urinary tract status: Secondary | ICD-10-CM | POA: Diagnosis not present

## 2013-07-19 DIAGNOSIS — R52 Pain, unspecified: Secondary | ICD-10-CM | POA: Diagnosis not present

## 2013-07-19 LAB — CBC CANCER CENTER
Basophil %: 1.3 %
Eosinophil #: 0.2 x10 3/mm (ref 0.0–0.7)
Eosinophil %: 2.4 %
HCT: 34.3 % — ABNORMAL LOW (ref 40.0–52.0)
Lymphocyte #: 1.9 x10 3/mm (ref 1.0–3.6)
Lymphocyte %: 29.3 %
Monocyte #: 0.5 x10 3/mm (ref 0.2–1.0)
Monocyte %: 8.4 %
Neutrophil #: 3.7 x10 3/mm (ref 1.4–6.5)
Neutrophil %: 58.6 %
RBC: 3.96 10*6/uL — ABNORMAL LOW (ref 4.40–5.90)
RDW: 16.2 % — ABNORMAL HIGH (ref 11.5–14.5)
WBC: 6.4 x10 3/mm (ref 3.8–10.6)

## 2013-07-19 LAB — COMPREHENSIVE METABOLIC PANEL
Albumin: 3.9 g/dL (ref 3.4–5.0)
Anion Gap: 9 (ref 7–16)
BUN: 22 mg/dL — ABNORMAL HIGH (ref 7–18)
Calcium, Total: 9.6 mg/dL (ref 8.5–10.1)
Co2: 30 mmol/L (ref 21–32)
EGFR (African American): 56 — ABNORMAL LOW
Glucose: 110 mg/dL — ABNORMAL HIGH (ref 65–99)
Potassium: 4.4 mmol/L (ref 3.5–5.1)
SGOT(AST): 15 U/L (ref 15–37)
SGPT (ALT): 16 U/L (ref 12–78)
Total Protein: 7.8 g/dL (ref 6.4–8.2)

## 2013-07-20 DIAGNOSIS — Z9221 Personal history of antineoplastic chemotherapy: Secondary | ICD-10-CM | POA: Diagnosis not present

## 2013-07-20 DIAGNOSIS — Z936 Other artificial openings of urinary tract status: Secondary | ICD-10-CM | POA: Diagnosis not present

## 2013-07-20 DIAGNOSIS — Z8551 Personal history of malignant neoplasm of bladder: Secondary | ICD-10-CM | POA: Diagnosis not present

## 2013-07-20 DIAGNOSIS — D649 Anemia, unspecified: Secondary | ICD-10-CM | POA: Diagnosis not present

## 2013-08-15 ENCOUNTER — Ambulatory Visit: Payer: Self-pay | Admitting: Oncology

## 2013-08-29 ENCOUNTER — Ambulatory Visit: Payer: Self-pay

## 2013-08-29 DIAGNOSIS — T148 Other injury of unspecified body region: Secondary | ICD-10-CM | POA: Diagnosis not present

## 2013-08-29 DIAGNOSIS — Z79899 Other long term (current) drug therapy: Secondary | ICD-10-CM | POA: Diagnosis not present

## 2013-08-29 DIAGNOSIS — R21 Rash and other nonspecific skin eruption: Secondary | ICD-10-CM | POA: Diagnosis not present

## 2013-08-29 LAB — CBC WITH DIFFERENTIAL/PLATELET
Basophil #: 0.1 10*3/uL (ref 0.0–0.1)
Eosinophil #: 0.2 10*3/uL (ref 0.0–0.7)
Eosinophil %: 4.4 %
HGB: 12 g/dL — ABNORMAL LOW (ref 13.0–18.0)
Lymphocyte #: 1.6 10*3/uL (ref 1.0–3.6)
Lymphocyte %: 35.7 %
MCHC: 33.1 g/dL (ref 32.0–36.0)
Monocyte #: 0.5 x10 3/mm (ref 0.2–1.0)
Monocyte %: 11.5 %
RBC: 4.11 10*6/uL — ABNORMAL LOW (ref 4.40–5.90)

## 2013-08-30 ENCOUNTER — Ambulatory Visit: Payer: Self-pay | Admitting: Oncology

## 2013-09-26 DIAGNOSIS — C679 Malignant neoplasm of bladder, unspecified: Secondary | ICD-10-CM | POA: Diagnosis not present

## 2013-10-26 ENCOUNTER — Ambulatory Visit: Payer: Self-pay | Admitting: Oncology

## 2013-10-26 DIAGNOSIS — F17200 Nicotine dependence, unspecified, uncomplicated: Secondary | ICD-10-CM | POA: Diagnosis not present

## 2013-10-26 DIAGNOSIS — Z452 Encounter for adjustment and management of vascular access device: Secondary | ICD-10-CM | POA: Diagnosis not present

## 2013-10-26 DIAGNOSIS — Z79899 Other long term (current) drug therapy: Secondary | ICD-10-CM | POA: Diagnosis not present

## 2013-10-26 DIAGNOSIS — Z23 Encounter for immunization: Secondary | ICD-10-CM | POA: Diagnosis not present

## 2013-10-26 DIAGNOSIS — D649 Anemia, unspecified: Secondary | ICD-10-CM | POA: Diagnosis not present

## 2013-10-26 DIAGNOSIS — C679 Malignant neoplasm of bladder, unspecified: Secondary | ICD-10-CM | POA: Diagnosis not present

## 2013-10-26 DIAGNOSIS — Z936 Other artificial openings of urinary tract status: Secondary | ICD-10-CM | POA: Diagnosis not present

## 2013-10-26 DIAGNOSIS — Z906 Acquired absence of other parts of urinary tract: Secondary | ICD-10-CM | POA: Diagnosis not present

## 2013-10-26 LAB — CBC CANCER CENTER
Basophil #: 0.1 x10 3/mm (ref 0.0–0.1)
Basophil %: 0.9 %
Eosinophil #: 0.2 x10 3/mm (ref 0.0–0.7)
Eosinophil %: 2.3 %
HGB: 12 g/dL — ABNORMAL LOW (ref 13.0–18.0)
Lymphocyte #: 1.4 x10 3/mm (ref 1.0–3.6)
MCH: 30.9 pg (ref 26.0–34.0)
MCHC: 33 g/dL (ref 32.0–36.0)
MCV: 94 fL (ref 80–100)
Monocyte #: 0.4 x10 3/mm (ref 0.2–1.0)
Monocyte %: 6.6 %
Neutrophil %: 69.8 %
RBC: 3.88 10*6/uL — ABNORMAL LOW (ref 4.40–5.90)

## 2013-10-26 LAB — BASIC METABOLIC PANEL
Anion Gap: 14 (ref 7–16)
Calcium, Total: 8.8 mg/dL (ref 8.5–10.1)
Chloride: 104 mmol/L (ref 98–107)
Creatinine: 1.79 mg/dL — ABNORMAL HIGH (ref 0.60–1.30)
EGFR (African American): 45 — ABNORMAL LOW
EGFR (Non-African Amer.): 39 — ABNORMAL LOW
Osmolality: 283 (ref 275–301)
Sodium: 140 mmol/L (ref 136–145)

## 2013-11-14 ENCOUNTER — Ambulatory Visit: Payer: Self-pay | Admitting: Oncology

## 2013-12-05 ENCOUNTER — Ambulatory Visit: Payer: Self-pay | Admitting: Physician Assistant

## 2013-12-05 DIAGNOSIS — F17200 Nicotine dependence, unspecified, uncomplicated: Secondary | ICD-10-CM | POA: Diagnosis not present

## 2013-12-05 DIAGNOSIS — L02219 Cutaneous abscess of trunk, unspecified: Secondary | ICD-10-CM | POA: Diagnosis not present

## 2013-12-07 ENCOUNTER — Ambulatory Visit: Payer: Self-pay | Admitting: Physician Assistant

## 2013-12-07 DIAGNOSIS — Z4800 Encounter for change or removal of nonsurgical wound dressing: Secondary | ICD-10-CM | POA: Diagnosis not present

## 2013-12-09 LAB — WOUND CULTURE

## 2014-01-27 ENCOUNTER — Ambulatory Visit: Payer: Self-pay | Admitting: Oncology

## 2014-01-27 DIAGNOSIS — Z8551 Personal history of malignant neoplasm of bladder: Secondary | ICD-10-CM | POA: Diagnosis not present

## 2014-01-27 DIAGNOSIS — Z808 Family history of malignant neoplasm of other organs or systems: Secondary | ICD-10-CM | POA: Diagnosis not present

## 2014-01-27 DIAGNOSIS — Z8042 Family history of malignant neoplasm of prostate: Secondary | ICD-10-CM | POA: Diagnosis not present

## 2014-01-27 DIAGNOSIS — Z87891 Personal history of nicotine dependence: Secondary | ICD-10-CM | POA: Diagnosis not present

## 2014-01-27 DIAGNOSIS — C679 Malignant neoplasm of bladder, unspecified: Secondary | ICD-10-CM | POA: Diagnosis not present

## 2014-01-27 DIAGNOSIS — R6889 Other general symptoms and signs: Secondary | ICD-10-CM | POA: Diagnosis not present

## 2014-01-27 DIAGNOSIS — Z8052 Family history of malignant neoplasm of bladder: Secondary | ICD-10-CM | POA: Diagnosis not present

## 2014-01-27 DIAGNOSIS — Z906 Acquired absence of other parts of urinary tract: Secondary | ICD-10-CM | POA: Diagnosis not present

## 2014-01-27 LAB — COMPREHENSIVE METABOLIC PANEL
AST: 17 U/L (ref 15–37)
Albumin: 4 g/dL (ref 3.4–5.0)
Alkaline Phosphatase: 99 U/L
Anion Gap: 8 (ref 7–16)
BUN: 19 mg/dL — ABNORMAL HIGH (ref 7–18)
Bilirubin,Total: 0.2 mg/dL (ref 0.2–1.0)
Calcium, Total: 9 mg/dL (ref 8.5–10.1)
Chloride: 101 mmol/L (ref 98–107)
Co2: 29 mmol/L (ref 21–32)
Creatinine: 1.72 mg/dL — ABNORMAL HIGH (ref 0.60–1.30)
EGFR (African American): 47 — ABNORMAL LOW
GFR CALC NON AF AMER: 41 — AB
Glucose: 121 mg/dL — ABNORMAL HIGH (ref 65–99)
OSMOLALITY: 279 (ref 275–301)
POTASSIUM: 4.4 mmol/L (ref 3.5–5.1)
SGPT (ALT): 21 U/L (ref 12–78)
Sodium: 138 mmol/L (ref 136–145)
Total Protein: 7.4 g/dL (ref 6.4–8.2)

## 2014-01-27 LAB — CBC CANCER CENTER
BASOS PCT: 0.2 %
Basophil #: 0 x10 3/mm (ref 0.0–0.1)
Eosinophil #: 0.1 x10 3/mm (ref 0.0–0.7)
Eosinophil %: 2.7 %
HCT: 40.4 % (ref 40.0–52.0)
HGB: 13.6 g/dL (ref 13.0–18.0)
Lymphocyte #: 1.5 x10 3/mm (ref 1.0–3.6)
Lymphocyte %: 28 %
MCH: 31.3 pg (ref 26.0–34.0)
MCHC: 33.8 g/dL (ref 32.0–36.0)
MCV: 93 fL (ref 80–100)
MONOS PCT: 10 %
Monocyte #: 0.5 x10 3/mm (ref 0.2–1.0)
Neutrophil #: 3.2 x10 3/mm (ref 1.4–6.5)
Neutrophil %: 59.1 %
Platelet: 271 x10 3/mm (ref 150–440)
RBC: 4.35 10*6/uL — ABNORMAL LOW (ref 4.40–5.90)
RDW: 14.2 % (ref 11.5–14.5)
WBC: 5.4 x10 3/mm (ref 3.8–10.6)

## 2014-02-01 DIAGNOSIS — Z8551 Personal history of malignant neoplasm of bladder: Secondary | ICD-10-CM | POA: Diagnosis not present

## 2014-02-12 ENCOUNTER — Ambulatory Visit: Payer: Self-pay | Admitting: Oncology

## 2014-03-27 DIAGNOSIS — C679 Malignant neoplasm of bladder, unspecified: Secondary | ICD-10-CM | POA: Diagnosis not present

## 2014-08-14 ENCOUNTER — Ambulatory Visit: Payer: Self-pay | Admitting: Oncology

## 2014-08-14 DIAGNOSIS — K8020 Calculus of gallbladder without cholecystitis without obstruction: Secondary | ICD-10-CM | POA: Diagnosis not present

## 2014-08-14 DIAGNOSIS — R6889 Other general symptoms and signs: Secondary | ICD-10-CM | POA: Diagnosis not present

## 2014-08-14 DIAGNOSIS — C679 Malignant neoplasm of bladder, unspecified: Secondary | ICD-10-CM | POA: Diagnosis not present

## 2014-08-14 DIAGNOSIS — Z8551 Personal history of malignant neoplasm of bladder: Secondary | ICD-10-CM | POA: Diagnosis not present

## 2014-08-14 LAB — COMPREHENSIVE METABOLIC PANEL
ALT: 18 U/L
AST: 15 U/L (ref 15–37)
Albumin: 4 g/dL (ref 3.4–5.0)
Alkaline Phosphatase: 108 U/L
Anion Gap: 10 (ref 7–16)
BUN: 17 mg/dL (ref 7–18)
Bilirubin,Total: 0.3 mg/dL (ref 0.2–1.0)
CALCIUM: 9.7 mg/dL (ref 8.5–10.1)
CREATININE: 1.79 mg/dL — AB (ref 0.60–1.30)
Chloride: 99 mmol/L (ref 98–107)
Co2: 27 mmol/L (ref 21–32)
EGFR (African American): 44 — ABNORMAL LOW
GFR CALC NON AF AMER: 38 — AB
Glucose: 117 mg/dL — ABNORMAL HIGH (ref 65–99)
Osmolality: 275 (ref 275–301)
POTASSIUM: 4.5 mmol/L (ref 3.5–5.1)
SODIUM: 136 mmol/L (ref 136–145)
Total Protein: 7.8 g/dL (ref 6.4–8.2)

## 2014-08-14 LAB — CBC CANCER CENTER
Basophil #: 0.1 x10 3/mm (ref 0.0–0.1)
Basophil %: 1.2 %
Eosinophil #: 0.1 x10 3/mm (ref 0.0–0.7)
Eosinophil %: 1.8 %
HCT: 42.4 % (ref 40.0–52.0)
HGB: 14.3 g/dL (ref 13.0–18.0)
LYMPHS PCT: 21.2 %
Lymphocyte #: 1.4 x10 3/mm (ref 1.0–3.6)
MCH: 31.1 pg (ref 26.0–34.0)
MCHC: 33.8 g/dL (ref 32.0–36.0)
MCV: 92 fL (ref 80–100)
Monocyte #: 0.6 x10 3/mm (ref 0.2–1.0)
Monocyte %: 8.4 %
NEUTROS PCT: 67.4 %
Neutrophil #: 4.5 x10 3/mm (ref 1.4–6.5)
Platelet: 299 x10 3/mm (ref 150–440)
RBC: 4.6 10*6/uL (ref 4.40–5.90)
RDW: 14.2 % (ref 11.5–14.5)
WBC: 6.7 x10 3/mm (ref 3.8–10.6)

## 2014-08-15 ENCOUNTER — Ambulatory Visit: Payer: Self-pay | Admitting: Oncology

## 2014-08-15 DIAGNOSIS — K439 Ventral hernia without obstruction or gangrene: Secondary | ICD-10-CM | POA: Diagnosis not present

## 2014-08-15 DIAGNOSIS — N289 Disorder of kidney and ureter, unspecified: Secondary | ICD-10-CM | POA: Diagnosis not present

## 2014-08-15 DIAGNOSIS — Z87891 Personal history of nicotine dependence: Secondary | ICD-10-CM | POA: Diagnosis not present

## 2014-08-15 DIAGNOSIS — K8020 Calculus of gallbladder without cholecystitis without obstruction: Secondary | ICD-10-CM | POA: Diagnosis not present

## 2014-08-15 DIAGNOSIS — Z8052 Family history of malignant neoplasm of bladder: Secondary | ICD-10-CM | POA: Diagnosis not present

## 2014-08-15 DIAGNOSIS — Z906 Acquired absence of other parts of urinary tract: Secondary | ICD-10-CM | POA: Diagnosis not present

## 2014-08-15 DIAGNOSIS — Z8551 Personal history of malignant neoplasm of bladder: Secondary | ICD-10-CM | POA: Diagnosis not present

## 2014-08-15 DIAGNOSIS — Z79899 Other long term (current) drug therapy: Secondary | ICD-10-CM | POA: Diagnosis not present

## 2014-08-15 DIAGNOSIS — N269 Renal sclerosis, unspecified: Secondary | ICD-10-CM | POA: Diagnosis not present

## 2014-08-15 DIAGNOSIS — Z8042 Family history of malignant neoplasm of prostate: Secondary | ICD-10-CM | POA: Diagnosis not present

## 2014-08-15 DIAGNOSIS — Z808 Family history of malignant neoplasm of other organs or systems: Secondary | ICD-10-CM | POA: Diagnosis not present

## 2014-08-15 DIAGNOSIS — IMO0002 Reserved for concepts with insufficient information to code with codable children: Secondary | ICD-10-CM | POA: Diagnosis not present

## 2014-08-16 ENCOUNTER — Ambulatory Visit: Payer: Self-pay | Admitting: Oncology

## 2014-08-16 DIAGNOSIS — Z8551 Personal history of malignant neoplasm of bladder: Secondary | ICD-10-CM | POA: Diagnosis not present

## 2014-09-14 ENCOUNTER — Ambulatory Visit: Payer: Self-pay | Admitting: Oncology

## 2014-10-09 DIAGNOSIS — C679 Malignant neoplasm of bladder, unspecified: Secondary | ICD-10-CM | POA: Diagnosis not present

## 2015-02-19 ENCOUNTER — Ambulatory Visit: Payer: Self-pay | Admitting: Oncology

## 2015-02-19 ENCOUNTER — Ambulatory Visit
Admit: 2015-02-19 | Disposition: A | Discharge status: Home / Self Care | Payer: Self-pay | Attending: Oncology | Admitting: Oncology

## 2015-02-19 DIAGNOSIS — K8020 Calculus of gallbladder without cholecystitis without obstruction: Secondary | ICD-10-CM | POA: Diagnosis not present

## 2015-02-19 DIAGNOSIS — K429 Umbilical hernia without obstruction or gangrene: Secondary | ICD-10-CM | POA: Diagnosis not present

## 2015-02-19 DIAGNOSIS — Z8551 Personal history of malignant neoplasm of bladder: Secondary | ICD-10-CM | POA: Diagnosis not present

## 2015-02-19 DIAGNOSIS — C679 Malignant neoplasm of bladder, unspecified: Secondary | ICD-10-CM | POA: Diagnosis not present

## 2015-02-19 DIAGNOSIS — K5900 Constipation, unspecified: Secondary | ICD-10-CM | POA: Diagnosis not present

## 2015-02-19 DIAGNOSIS — N261 Atrophy of kidney (terminal): Secondary | ICD-10-CM | POA: Diagnosis not present

## 2015-02-19 DIAGNOSIS — Z936 Other artificial openings of urinary tract status: Secondary | ICD-10-CM | POA: Diagnosis not present

## 2015-02-19 DIAGNOSIS — Z8042 Family history of malignant neoplasm of prostate: Secondary | ICD-10-CM | POA: Diagnosis not present

## 2015-02-19 DIAGNOSIS — Z8052 Family history of malignant neoplasm of bladder: Secondary | ICD-10-CM | POA: Diagnosis not present

## 2015-02-19 DIAGNOSIS — N289 Disorder of kidney and ureter, unspecified: Secondary | ICD-10-CM | POA: Diagnosis not present

## 2015-02-19 DIAGNOSIS — Z87891 Personal history of nicotine dependence: Secondary | ICD-10-CM | POA: Diagnosis not present

## 2015-02-19 DIAGNOSIS — Z9221 Personal history of antineoplastic chemotherapy: Secondary | ICD-10-CM | POA: Diagnosis not present

## 2015-02-19 DIAGNOSIS — Z906 Acquired absence of other parts of urinary tract: Secondary | ICD-10-CM | POA: Diagnosis not present

## 2015-02-19 DIAGNOSIS — Z808 Family history of malignant neoplasm of other organs or systems: Secondary | ICD-10-CM | POA: Diagnosis not present

## 2015-02-23 DIAGNOSIS — Z8551 Personal history of malignant neoplasm of bladder: Secondary | ICD-10-CM | POA: Diagnosis not present

## 2015-02-23 DIAGNOSIS — N289 Disorder of kidney and ureter, unspecified: Secondary | ICD-10-CM | POA: Diagnosis not present

## 2015-03-16 ENCOUNTER — Ambulatory Visit
Admit: 2015-03-16 | Disposition: A | Discharge status: Home / Self Care | Payer: Self-pay | Attending: Oncology | Admitting: Oncology

## 2015-05-24 ENCOUNTER — Other Ambulatory Visit: Payer: Self-pay | Admitting: *Deleted

## 2015-05-24 DIAGNOSIS — C679 Malignant neoplasm of bladder, unspecified: Secondary | ICD-10-CM

## 2015-05-25 ENCOUNTER — Inpatient Hospital Stay: Payer: Medicare Other

## 2015-05-25 ENCOUNTER — Inpatient Hospital Stay: Payer: Medicare Other | Attending: Oncology | Admitting: Oncology

## 2015-05-25 VITALS — BP 151/76 | HR 75 | Temp 96.6°F | Resp 18 | Wt 208.6 lb

## 2015-05-25 DIAGNOSIS — C679 Malignant neoplasm of bladder, unspecified: Secondary | ICD-10-CM

## 2015-05-25 DIAGNOSIS — Z8551 Personal history of malignant neoplasm of bladder: Secondary | ICD-10-CM | POA: Diagnosis not present

## 2015-05-25 DIAGNOSIS — N289 Disorder of kidney and ureter, unspecified: Secondary | ICD-10-CM | POA: Insufficient documentation

## 2015-05-25 LAB — COMPREHENSIVE METABOLIC PANEL
ALBUMIN: 4.3 g/dL (ref 3.5–5.0)
ALT: 13 U/L — ABNORMAL LOW (ref 17–63)
AST: 18 U/L (ref 15–41)
Alkaline Phosphatase: 92 U/L (ref 38–126)
Anion gap: 8 (ref 5–15)
BILIRUBIN TOTAL: 0.7 mg/dL (ref 0.3–1.2)
BUN: 17 mg/dL (ref 6–20)
CO2: 26 mmol/L (ref 22–32)
Calcium: 9.5 mg/dL (ref 8.9–10.3)
Chloride: 102 mmol/L (ref 101–111)
Creatinine, Ser: 1.75 mg/dL — ABNORMAL HIGH (ref 0.61–1.24)
GFR calc Af Amer: 44 mL/min — ABNORMAL LOW (ref >60)
GFR calc non Af Amer: 38 mL/min — ABNORMAL LOW (ref >60)
Glucose, Bld: 112 mg/dL — ABNORMAL HIGH (ref 65–99)
POTASSIUM: 4.4 mmol/L (ref 3.5–5.1)
SODIUM: 136 mmol/L (ref 135–145)
Total Protein: 7.7 g/dL (ref 6.5–8.1)

## 2015-05-25 LAB — CBC WITH DIFFERENTIAL/PLATELET
BASOS PCT: 1 %
Basophils Absolute: 0.1 10*3/uL (ref 0–0.1)
EOS ABS: 0.1 10*3/uL (ref 0–0.7)
EOS PCT: 2 %
HCT: 41.7 % (ref 40.0–52.0)
HEMOGLOBIN: 14.1 g/dL (ref 13.0–18.0)
Lymphocytes Relative: 27 %
Lymphs Abs: 1.9 10*3/uL (ref 1.0–3.6)
MCH: 30.4 pg (ref 26.0–34.0)
MCHC: 33.8 g/dL (ref 32.0–36.0)
MCV: 89.9 fL (ref 80.0–100.0)
Monocytes Absolute: 0.6 10*3/uL (ref 0.2–1.0)
Monocytes Relative: 9 %
NEUTROS ABS: 4.2 10*3/uL (ref 1.4–6.5)
NEUTROS PCT: 61 %
PLATELETS: 280 10*3/uL (ref 150–440)
RBC: 4.64 MIL/uL (ref 4.40–5.90)
RDW: 14.1 % (ref 11.5–14.5)
WBC: 6.9 10*3/uL (ref 3.8–10.6)

## 2015-06-15 ENCOUNTER — Telehealth: Payer: Self-pay | Admitting: *Deleted

## 2015-06-15 NOTE — Telephone Encounter (Signed)
  Oncology Nurse Navigator Documentation    Navigator Encounter Type: Screening (06/15/15 1300)             Time Spent with Patient: 15 (06/15/15 1300) After multiple attempts was able to contact patient about low dose CT screening scan for lung cancer screening. This patient was referred by Georgeanne Nim NP. Discussed screening at length, but patient is still not interested in have Ct at this time.

## 2015-06-17 NOTE — Progress Notes (Signed)
[place]  [phone] [phone]) [phone]  ID: Rodney Day OB: 1946/10/03  MR#: [id number]  AOZ#:[id number]  No care team member to display  CHIEF COMPLAINT:  Chief Complaint  Patient presents with  . Follow-up    Bladder cancer    INTERVAL HISTORY: Patient returns to clinic today for repeat laboratory work and further evaluation.  He currently feels well and is asymptomatic. He continues to have a good appetite and his weight has returned to his baseline.  He denies any recent fevers.  He has no neurologic complaints.  He denies any chest pain or shortness of breath.  He denies any nausea, vomiting, constipation, or diarrhea.  Patient offers no specific complaints today.   REVIEW OF SYSTEMS:   Review of Systems  Constitutional: Negative.   Genitourinary: Negative.     As per HPI. Otherwise, a complete review of systems is negatve.  PAST MEDICAL HISTORY: No past medical history on file.  PAST SURGICAL HISTORY: Total cystectomy.  FAMILY HISTORY: Father with bladder and prostate cancer, grandmother with thyroid cancer.     ADVANCED DIRECTIVES:    HEALTH MAINTENANCE: History  Substance Use Topics  . Smoking status: Not on file  . Smokeless tobacco: Not on file  . Alcohol Use: Not on file     Colonoscopy:  PAP:  Bone density:  Lipid panel:  Allergies not on file  Current Outpatient Prescriptions  Medication Sig Dispense Refill  . Aspirin-Salicylamide-Caffeine (BC HEADACHE POWDER PO) Take by mouth.    Marland Kitchen ibuprofen (ADVIL,MOTRIN) 200 MG tablet Take 200 mg by mouth.     No current facility-administered medications for this visit.    OBJECTIVE: Filed Vitals:   05/25/15 1017  BP: 151/76  Pulse: 75  Temp: 96.6 F (35.9 C)  Resp: 18     There is no height on file to calculate BMI.    ECOG FS:0 - Asymptomatic  General: Well-developed, well-nourished, no acute distress. Eyes: anicteric sclera. Lungs: Clear to  auscultation bilaterally. Heart: Regular rate and rhythm. No rubs, murmurs, or gallops. Abdomen: Soft, nontender, nondistended. No organomegaly noted, normoactive bowel sounds. Musculoskeletal: No edema, cyanosis, or clubbing. Neuro: Alert, answering all questions appropriately. Cranial nerves grossly intact. Skin: No rashes or petechiae noted. Psych: Normal affect.  LAB RESULTS:  Lab Results  Component Value Date   NA 136 05/25/2015   K 4.4 05/25/2015   CL 102 05/25/2015   CO2 26 05/25/2015   GLUCOSE 112* 05/25/2015   BUN 17 05/25/2015   CREATININE 1.75* 05/25/2015   CALCIUM 9.5 05/25/2015   PROT 7.7 05/25/2015   ALBUMIN 4.3 05/25/2015   AST 18 05/25/2015   ALT 13* 05/25/2015   ALKPHOS 92 05/25/2015   BILITOT 0.7 05/25/2015   GFRNONAA 38* 05/25/2015   GFRAA 44* 05/25/2015    Lab Results  Component Value Date   WBC 6.9 05/25/2015   NEUTROABS 4.2 05/25/2015   HGB 14.1 05/25/2015   HCT 41.7 05/25/2015   MCV 89.9 05/25/2015   PLT 280 05/25/2015     STUDIES: No results found.  ASSESSMENT: Pathologic stage III transitional cell carcinoma, status post radical cystectomy.  PLAN:    1.  Bladder cancer:  Patient completed neoadjuvant chemotherapy followed by total cystectomy in May 2014. CT scan results from May 2016 did not reveal any evidence of recurrence. There is no need to do any further imaging unless there is suspicion. Return to clinic in 6 months with repeat laboratory work and further evaluation. 2.  Renal insufficiency: Creatinine is approximately at his baseline. Monitor.   Patient expressed understanding and was in agreement with this plan. He also understands that He can call clinic at any time with any questions, concerns, or complaints.   Bladder cancer   Staging form: Urinary Bladder, AJCC 7th Edition     Clinical stage from 05/25/2015: Stage III (T3, N0, M0) - Signed by Lloyd Huger, MD on 05/25/2015   Lloyd Huger, MD   06/17/2015 3:32  PM

## 2015-10-22 DIAGNOSIS — C679 Malignant neoplasm of bladder, unspecified: Secondary | ICD-10-CM | POA: Diagnosis not present

## 2015-10-22 DIAGNOSIS — Z8551 Personal history of malignant neoplasm of bladder: Secondary | ICD-10-CM | POA: Diagnosis not present

## 2015-11-23 ENCOUNTER — Inpatient Hospital Stay: Payer: Medicare Other | Attending: Oncology

## 2015-11-23 ENCOUNTER — Encounter: Payer: Self-pay | Admitting: Oncology

## 2015-11-23 ENCOUNTER — Inpatient Hospital Stay (HOSPITAL_BASED_OUTPATIENT_CLINIC_OR_DEPARTMENT_OTHER): Payer: Medicare Other | Admitting: Oncology

## 2015-11-23 VITALS — BP 140/84 | HR 79 | Temp 97.8°F | Resp 18 | Wt 201.3 lb

## 2015-11-23 DIAGNOSIS — Z23 Encounter for immunization: Secondary | ICD-10-CM | POA: Insufficient documentation

## 2015-11-23 DIAGNOSIS — Z9221 Personal history of antineoplastic chemotherapy: Secondary | ICD-10-CM | POA: Insufficient documentation

## 2015-11-23 DIAGNOSIS — R0981 Nasal congestion: Secondary | ICD-10-CM | POA: Insufficient documentation

## 2015-11-23 DIAGNOSIS — N289 Disorder of kidney and ureter, unspecified: Secondary | ICD-10-CM

## 2015-11-23 DIAGNOSIS — C679 Malignant neoplasm of bladder, unspecified: Secondary | ICD-10-CM

## 2015-11-23 DIAGNOSIS — Z87891 Personal history of nicotine dependence: Secondary | ICD-10-CM

## 2015-11-23 DIAGNOSIS — Z8042 Family history of malignant neoplasm of prostate: Secondary | ICD-10-CM | POA: Diagnosis not present

## 2015-11-23 DIAGNOSIS — Z8052 Family history of malignant neoplasm of bladder: Secondary | ICD-10-CM

## 2015-11-23 DIAGNOSIS — Z906 Acquired absence of other parts of urinary tract: Secondary | ICD-10-CM | POA: Diagnosis not present

## 2015-11-23 DIAGNOSIS — Z8551 Personal history of malignant neoplasm of bladder: Secondary | ICD-10-CM | POA: Diagnosis not present

## 2015-11-23 DIAGNOSIS — Z808 Family history of malignant neoplasm of other organs or systems: Secondary | ICD-10-CM | POA: Insufficient documentation

## 2015-11-23 LAB — BASIC METABOLIC PANEL
ANION GAP: 8 (ref 5–15)
BUN: 16 mg/dL (ref 6–20)
CALCIUM: 9.6 mg/dL (ref 8.9–10.3)
CO2: 27 mmol/L (ref 22–32)
Chloride: 98 mmol/L — ABNORMAL LOW (ref 101–111)
Creatinine, Ser: 1.81 mg/dL — ABNORMAL HIGH (ref 0.61–1.24)
GFR calc Af Amer: 43 mL/min — ABNORMAL LOW (ref >60)
GFR, EST NON AFRICAN AMERICAN: 37 mL/min — AB (ref >60)
Glucose, Bld: 127 mg/dL — ABNORMAL HIGH (ref 65–99)
Potassium: 4.2 mmol/L (ref 3.5–5.1)
SODIUM: 133 mmol/L — AB (ref 135–145)

## 2015-11-23 LAB — CBC WITH DIFFERENTIAL/PLATELET
BASOS ABS: 0.1 10*3/uL (ref 0–0.1)
BASOS PCT: 1 %
EOS PCT: 2 %
Eosinophils Absolute: 0.1 10*3/uL (ref 0–0.7)
HCT: 41.8 % (ref 40.0–52.0)
Hemoglobin: 14.1 g/dL (ref 13.0–18.0)
Lymphocytes Relative: 28 %
Lymphs Abs: 1.4 10*3/uL (ref 1.0–3.6)
MCH: 30.1 pg (ref 26.0–34.0)
MCHC: 33.8 g/dL (ref 32.0–36.0)
MCV: 89.1 fL (ref 80.0–100.0)
MONO ABS: 0.5 10*3/uL (ref 0.2–1.0)
Monocytes Relative: 10 %
Neutro Abs: 3 10*3/uL (ref 1.4–6.5)
Neutrophils Relative %: 59 %
PLATELETS: 280 10*3/uL (ref 150–440)
RBC: 4.69 MIL/uL (ref 4.40–5.90)
RDW: 14.2 % (ref 11.5–14.5)
WBC: 5 10*3/uL (ref 3.8–10.6)

## 2015-11-23 MED ORDER — INFLUENZA VAC SPLIT QUAD 0.5 ML IM SUSY
0.5000 mL | PREFILLED_SYRINGE | Freq: Once | INTRAMUSCULAR | Status: AC
  Administered 2015-11-23: 0.5 mL via INTRAMUSCULAR
  Filled 2015-11-23: qty 0.5

## 2015-11-23 NOTE — Progress Notes (Signed)
Patient is having sinus congestion and denies any other problems today.

## 2015-12-14 NOTE — Progress Notes (Signed)
[place]  Telephone:([phone] Fax:(336) [phone]  ID: Rodney Day OB: 1946-08-29  MR#: [id number]  [id number]  No care team member to display  CHIEF COMPLAINT:  Chief Complaint  Patient presents with  . Bladder Cancer    INTERVAL HISTORY: Patient returns to clinic today for repeat laboratory work and further evaluation.  He has some increased sinus congestion today, but otherwise feels well and is asymptomatic. He continues to have a good appetite and his weight has returned to his baseline.  He denies any recent fevers.  He has no neurologic complaints.  He denies any chest pain or shortness of breath.  He denies any nausea, vomiting, constipation, or diarrhea.  Patient offers no further specific complaints today.   REVIEW OF SYSTEMS:   Review of Systems  Constitutional: Negative.   HENT: Positive for congestion.   Respiratory: Negative.   Cardiovascular: Negative.   Gastrointestinal: Negative.   Genitourinary: Negative.   Musculoskeletal: Negative.   Neurological: Negative.     As per HPI. Otherwise, a complete review of systems is negatve.  PAST MEDICAL HISTORY: Past Medical History  Diagnosis Date  . Malignant neoplasm of bladder ([place])     PAST SURGICAL HISTORY: Total cystectomy.  FAMILY HISTORY: Father with bladder and prostate cancer, grandmother with thyroid cancer.     ADVANCED DIRECTIVES:    HEALTH MAINTENANCE: Social History  Substance Use Topics  . Smoking status: Former Smoker -- 51 years    Types: Cigarettes    Quit date: 07/25/2013  . Smokeless tobacco: Never Used  . Alcohol Use: 0.0 oz/week    0 Standard drinks or equivalent per week     Comment: 1 beer every 2 weeks     Colonoscopy:  PAP:  Bone density:  Lipid panel:  No Known Allergies  Current Outpatient Prescriptions  Medication Sig Dispense Refill  . Aspirin-Salicylamide-Caffeine (BC HEADACHE POWDER PO) Take by mouth.    Marland Kitchen ibuprofen  (ADVIL,MOTRIN) 200 MG tablet Take 200 mg by mouth.     No current facility-administered medications for this visit.    OBJECTIVE: Filed Vitals:   11/23/15 1039  BP: 140/84  Pulse: 79  Temp: 97.8 F (36.6 C)  Resp: 18     There is no height on file to calculate BMI.    ECOG FS:0 - Asymptomatic  General: Well-developed, well-nourished, no acute distress. Eyes: anicteric sclera. Lungs: Clear to auscultation bilaterally. Heart: Regular rate and rhythm. No rubs, murmurs, or gallops. Abdomen: Soft, nontender, nondistended. No organomegaly noted, normoactive bowel sounds. Urostomy tubes noted. Musculoskeletal: No edema, cyanosis, or clubbing. Neuro: Alert, answering all questions appropriately. Cranial nerves grossly intact. Skin: No rashes or petechiae noted. Psych: Normal affect.  LAB RESULTS:  Lab Results  Component Value Date   NA 133* 11/23/2015   K 4.2 11/23/2015   CL 98* 11/23/2015   CO2 27 11/23/2015   GLUCOSE 127* 11/23/2015   BUN 16 11/23/2015   CREATININE 1.81* 11/23/2015   CALCIUM 9.6 11/23/2015   PROT 7.7 05/25/2015   ALBUMIN 4.3 05/25/2015   AST 18 05/25/2015   ALT 13* 05/25/2015   ALKPHOS 92 05/25/2015   BILITOT 0.7 05/25/2015   GFRNONAA 37* 11/23/2015   GFRAA 43* 11/23/2015    Lab Results  Component Value Date   WBC 5.0 11/23/2015   NEUTROABS 3.0 11/23/2015   HGB 14.1 11/23/2015   HCT 41.8 11/23/2015   MCV 89.1 11/23/2015   PLT 280 11/23/2015     STUDIES: No results  found.  ASSESSMENT: Pathologic stage III transitional cell carcinoma, status post radical cystectomy.  PLAN:    1.  Bladder cancer:  Patient completed neoadjuvant chemotherapy followed by total cystectomy in May 2014. CT scan results from May 2016 did not reveal any evidence of recurrence. There is no need to do any further imaging unless there is suspicion of recurrence. Return to clinic in 6 months with repeat laboratory work and further evaluation. 2.  Renal insufficiency:  Creatinine is approximately at his baseline. Monitor.  3. Congestion: Continue OTC treatments as needed.  Patient expressed understanding and was in agreement with this plan. He also understands that He can call clinic at any time with any questions, concerns, or complaints.   Bladder cancer   Staging form: Urinary Bladder, AJCC 7th Edition     Clinical stage from 05/25/2015: Stage III (T3, N0, M0) - Signed by Lloyd Huger, MD on 05/25/2015   Lloyd Huger, MD   12/14/2015 3:27 PM

## 2016-03-29 ENCOUNTER — Inpatient Hospital Stay
Admission: EM | Admit: 2016-03-29 | Discharge: 2016-03-31 | DRG: 446 | Disposition: A | Discharge status: Home / Self Care | Payer: Medicare Other | Attending: Surgery | Admitting: Surgery

## 2016-03-29 ENCOUNTER — Encounter: Payer: Self-pay | Admitting: Emergency Medicine

## 2016-03-29 ENCOUNTER — Inpatient Hospital Stay: Payer: Medicare Other

## 2016-03-29 ENCOUNTER — Emergency Department: Payer: Medicare Other

## 2016-03-29 DIAGNOSIS — K5900 Constipation, unspecified: Secondary | ICD-10-CM | POA: Diagnosis present

## 2016-03-29 DIAGNOSIS — K819 Cholecystitis, unspecified: Secondary | ICD-10-CM | POA: Diagnosis not present

## 2016-03-29 DIAGNOSIS — R935 Abnormal findings on diagnostic imaging of other abdominal regions, including retroperitoneum: Secondary | ICD-10-CM | POA: Diagnosis not present

## 2016-03-29 DIAGNOSIS — Z23 Encounter for immunization: Secondary | ICD-10-CM | POA: Diagnosis not present

## 2016-03-29 DIAGNOSIS — K8062 Calculus of gallbladder and bile duct with acute cholecystitis without obstruction: Principal | ICD-10-CM | POA: Diagnosis present

## 2016-03-29 DIAGNOSIS — C679 Malignant neoplasm of bladder, unspecified: Secondary | ICD-10-CM | POA: Diagnosis not present

## 2016-03-29 DIAGNOSIS — K8020 Calculus of gallbladder without cholecystitis without obstruction: Secondary | ICD-10-CM | POA: Diagnosis not present

## 2016-03-29 DIAGNOSIS — Z8042 Family history of malignant neoplasm of prostate: Secondary | ICD-10-CM

## 2016-03-29 DIAGNOSIS — R1011 Right upper quadrant pain: Secondary | ICD-10-CM | POA: Diagnosis not present

## 2016-03-29 DIAGNOSIS — Z8551 Personal history of malignant neoplasm of bladder: Secondary | ICD-10-CM

## 2016-03-29 DIAGNOSIS — Z8052 Family history of malignant neoplasm of bladder: Secondary | ICD-10-CM

## 2016-03-29 DIAGNOSIS — R109 Unspecified abdominal pain: Secondary | ICD-10-CM

## 2016-03-29 DIAGNOSIS — Z936 Other artificial openings of urinary tract status: Secondary | ICD-10-CM | POA: Diagnosis not present

## 2016-03-29 DIAGNOSIS — Z9889 Other specified postprocedural states: Secondary | ICD-10-CM | POA: Diagnosis not present

## 2016-03-29 DIAGNOSIS — K838 Other specified diseases of biliary tract: Secondary | ICD-10-CM

## 2016-03-29 DIAGNOSIS — F17210 Nicotine dependence, cigarettes, uncomplicated: Secondary | ICD-10-CM | POA: Diagnosis present

## 2016-03-29 DIAGNOSIS — Z823 Family history of stroke: Secondary | ICD-10-CM

## 2016-03-29 DIAGNOSIS — K8019 Calculus of gallbladder with other cholecystitis with obstruction: Secondary | ICD-10-CM | POA: Diagnosis present

## 2016-03-29 DIAGNOSIS — K8067 Calculus of gallbladder and bile duct with acute and chronic cholecystitis with obstruction: Secondary | ICD-10-CM | POA: Diagnosis not present

## 2016-03-29 LAB — CBC WITH DIFFERENTIAL/PLATELET
BASOS ABS: 0.1 10*3/uL (ref 0–0.1)
Basophils Relative: 1 %
EOS PCT: 0 %
Eosinophils Absolute: 0 10*3/uL (ref 0–0.7)
HEMATOCRIT: 39.9 % — AB (ref 40.0–52.0)
Hemoglobin: 13.8 g/dL (ref 13.0–18.0)
LYMPHS ABS: 0.6 10*3/uL — AB (ref 1.0–3.6)
LYMPHS PCT: 4 %
MCH: 30.2 pg (ref 26.0–34.0)
MCHC: 34.6 g/dL (ref 32.0–36.0)
MCV: 87.4 fL (ref 80.0–100.0)
MONO ABS: 1.4 10*3/uL — AB (ref 0.2–1.0)
MONOS PCT: 10 %
NEUTROS ABS: 13 10*3/uL — AB (ref 1.4–6.5)
Neutrophils Relative %: 85 %
PLATELETS: 251 10*3/uL (ref 150–440)
RBC: 4.56 MIL/uL (ref 4.40–5.90)
RDW: 15 % — AB (ref 11.5–14.5)
WBC: 15.2 10*3/uL — ABNORMAL HIGH (ref 3.8–10.6)

## 2016-03-29 LAB — COMPREHENSIVE METABOLIC PANEL
ALBUMIN: 4 g/dL (ref 3.5–5.0)
ALT: 11 U/L — ABNORMAL LOW (ref 17–63)
AST: 14 U/L — AB (ref 15–41)
Alkaline Phosphatase: 79 U/L (ref 38–126)
Anion gap: 9 (ref 5–15)
BUN: 19 mg/dL (ref 6–20)
CHLORIDE: 96 mmol/L — AB (ref 101–111)
CO2: 23 mmol/L (ref 22–32)
Calcium: 9 mg/dL (ref 8.9–10.3)
Creatinine, Ser: 1.73 mg/dL — ABNORMAL HIGH (ref 0.61–1.24)
GFR calc Af Amer: 45 mL/min — ABNORMAL LOW (ref >60)
GFR, EST NON AFRICAN AMERICAN: 39 mL/min — AB (ref >60)
Glucose, Bld: 132 mg/dL — ABNORMAL HIGH (ref 65–99)
POTASSIUM: 4.2 mmol/L (ref 3.5–5.1)
SODIUM: 128 mmol/L — AB (ref 135–145)
Total Bilirubin: 1 mg/dL (ref 0.3–1.2)
Total Protein: 7.9 g/dL (ref 6.5–8.1)

## 2016-03-29 LAB — URINALYSIS COMPLETE WITH MICROSCOPIC (ARMC ONLY)
BACTERIA UA: NONE SEEN
BILIRUBIN URINE: NEGATIVE
GLUCOSE, UA: NEGATIVE mg/dL
HGB URINE DIPSTICK: NEGATIVE
NITRITE: NEGATIVE
Protein, ur: 100 mg/dL — AB
SPECIFIC GRAVITY, URINE: 1.02 (ref 1.005–1.030)
pH: 6 (ref 5.0–8.0)

## 2016-03-29 LAB — TROPONIN I: Troponin I: <0.03 ng/mL (ref <0.031)

## 2016-03-29 LAB — LIPASE, BLOOD: Lipase: 11 U/L (ref 11–51)

## 2016-03-29 LAB — PROTIME-INR
INR: 1.18
Prothrombin Time: 15.2 seconds — ABNORMAL HIGH (ref 11.4–15.0)

## 2016-03-29 LAB — MAGNESIUM: Magnesium: 1.8 mg/dL (ref 1.7–2.4)

## 2016-03-29 MED ORDER — CYCLOBENZAPRINE HCL 10 MG PO TABS
10.0000 mg | ORAL_TABLET | Freq: Three times a day (TID) | ORAL | Status: DC
Start: 2016-03-29 — End: 2016-03-31
  Administered 2016-03-29 – 2016-03-31 (×6): 10 mg via ORAL
  Filled 2016-03-29 (×6): qty 1

## 2016-03-29 MED ORDER — HYDROMORPHONE HCL 1 MG/ML IJ SOLN
1.0000 mg | Freq: Once | INTRAMUSCULAR | Status: AC
  Administered 2016-03-29: 1 mg via INTRAVENOUS
  Filled 2016-03-29: qty 1

## 2016-03-29 MED ORDER — ONDANSETRON 4 MG PO TBDP
4.0000 mg | ORAL_TABLET | Freq: Four times a day (QID) | ORAL | Status: DC | PRN
  Filled 2016-03-29: qty 1

## 2016-03-29 MED ORDER — ONDANSETRON HCL 4 MG/2ML IJ SOLN: 4.0000 mg | Freq: Four times a day (QID) | INTRAMUSCULAR | Status: DC | PRN

## 2016-03-29 MED ORDER — PIPERACILLIN-TAZOBACTAM 3.375 G IVPB 30 MIN
3.3750 g | Freq: Once | INTRAVENOUS | Status: AC
  Administered 2016-03-29: 3.375 g via INTRAVENOUS
  Filled 2016-03-29: qty 50

## 2016-03-29 MED ORDER — DEXTROSE-NACL 5-0.9 % IV SOLN
INTRAVENOUS | Status: DC
  Administered 2016-03-29 – 2016-03-31 (×7): via INTRAVENOUS

## 2016-03-29 MED ORDER — OXYCODONE-ACETAMINOPHEN 5-325 MG PO TABS
2.0000 | ORAL_TABLET | ORAL | Status: DC | PRN
  Administered 2016-03-30: 2 via ORAL
  Filled 2016-03-29 (×2): qty 2

## 2016-03-29 MED ORDER — GADOBENATE DIMEGLUMINE 529 MG/ML IV SOLN
20.0000 mL | Freq: Once | INTRAVENOUS | Status: AC | PRN
  Administered 2016-03-29: 8 mL via INTRAVENOUS

## 2016-03-29 MED ORDER — HYDROMORPHONE HCL 1 MG/ML IJ SOLN
0.5000 mg | INTRAMUSCULAR | Status: DC | PRN
Start: 2016-03-29 — End: 2016-03-30
  Administered 2016-03-29 – 2016-03-30 (×4): 0.5 mg via INTRAVENOUS
  Filled 2016-03-29 (×4): qty 1

## 2016-03-29 MED ORDER — SODIUM CHLORIDE 0.9 % IV BOLUS (SEPSIS)
500.0000 mL | Freq: Once | INTRAVENOUS | Status: AC
  Administered 2016-03-29: 500 mL via INTRAVENOUS

## 2016-03-29 MED ORDER — PANTOPRAZOLE SODIUM 40 MG IV SOLR
40.0000 mg | Freq: Every day | INTRAVENOUS | Status: DC
  Administered 2016-03-29 – 2016-03-30 (×2): 40 mg via INTRAVENOUS
  Filled 2016-03-29 (×2): qty 40

## 2016-03-29 MED ORDER — ONDANSETRON HCL 4 MG/2ML IJ SOLN
4.0000 mg | Freq: Once | INTRAMUSCULAR | Status: AC
Start: 2016-03-29 — End: 2016-03-29
  Administered 2016-03-29: 4 mg via INTRAVENOUS
  Filled 2016-03-29: qty 2

## 2016-03-29 MED ORDER — PIPERACILLIN-TAZOBACTAM 3.375 G IVPB
3.3750 g | Freq: Three times a day (TID) | INTRAVENOUS | Status: DC
  Administered 2016-03-29 – 2016-03-31 (×6): 3.375 g via INTRAVENOUS
  Filled 2016-03-29 (×8): qty 50

## 2016-03-29 MED ORDER — KETOROLAC TROMETHAMINE 15 MG/ML IJ SOLN
15.0000 mg | Freq: Four times a day (QID) | INTRAMUSCULAR | Status: DC
  Administered 2016-03-29 – 2016-03-31 (×6): 15 mg via INTRAVENOUS
  Filled 2016-03-29 (×6): qty 1

## 2016-03-29 MED ORDER — HEPARIN SODIUM (PORCINE) 5000 UNIT/ML IJ SOLN
5000.0000 [IU] | Freq: Three times a day (TID) | INTRAMUSCULAR | Status: DC
Start: 2016-03-29 — End: 2016-03-30
  Administered 2016-03-29 – 2016-03-30 (×3): 5000 [IU] via SUBCUTANEOUS
  Filled 2016-03-29 (×3): qty 1

## 2016-03-29 MED ORDER — SODIUM CHLORIDE 0.9 % IV BOLUS (SEPSIS)
1000.0000 mL | Freq: Once | INTRAVENOUS | Status: AC
  Administered 2016-03-29: 1000 mL via INTRAVENOUS

## 2016-03-29 MED ORDER — PNEUMOCOCCAL VAC POLYVALENT 25 MCG/0.5ML IJ INJ
0.5000 mL | INJECTION | INTRAMUSCULAR | Status: AC
  Administered 2016-03-30: 0.5 mL via INTRAMUSCULAR
  Filled 2016-03-29: qty 0.5

## 2016-03-29 NOTE — ED Notes (Signed)
Patient transported to CT 

## 2016-03-29 NOTE — Progress Notes (Signed)
Spoke with MRI tech to follow up on anticipated time of ordered MRCP.  Tech stated that order had to be placed STAT and he would be able to come in on weekend to proceed otherwise, it would be scheduled as routine.  Verified with Dr. Azalee Course that order was STAT and she verified that it was put in as STAT early this am.  Tech advised and he confirmed he will be in to do this afternoon. Lauris Poag, RN 03/29/16 1600

## 2016-03-29 NOTE — H&P (Signed)
Rodney Day is an 70 y.o. male.   Chief Complaint: abdominal pain and nausea HPI:  70 yr old male past medical history of stage III bladder cancer diagnosed back in 2014 status post urostomy in May 2014 comes in today with complaint of right upper quadrant pain and nausea as well as vomiting starting on Thursday. Patient states that he has had attacks like this before however the pain has not been as intense and is not lasted very long in the past. Patient states that he ate a "greasy-style" Spam Sandwich shortly before the pain started on Thursday. Patient states that the pain initially was in the epigastric region and then centered mainly in the right side. Patient also states he's been having some soreness in his right shoulder since that time the body of pulled a muscle. Patient states that he has been constipated off and on ever since his surgery and does take some MiraLAX or other laxatives to help with this. Patient does state that he has to take it maybe twice a week. Patient states that he occasionally will have to take some Percocet for pain as well and did attempt to take this with the pain. Patient states that the Percocet did not touch the pain that was 8 out of 10 and did not decrease. Patient states that he has been drinking Gatorade water and coffee since Thursday but has not had solid foods. Patient states that the cough he did make him have some increased pain. Patient denies any current nausea and he has not vomited since Thursday. Patient states he had a bowel movement 2 days ago which was normal for him. Patient also states that he had some chills yesterday feeling cold and then not but states that he did not have a fever but actually slightly lower temperature 97.    Past Medical History  Diagnosis Date  . Malignant neoplasm of bladder [facility])     Past Surgical History  Procedure Laterality Date  . Transurethral resection of bladder tumor    . Pr cystectomy,ileal  conduit/sigmoid bladder  05/13/2013    CYSTECTOMY, COMPLETE, WITH URETEROILEAL CONDUIT OR SIGMOID BLADDER, INCLUDING INTESTINE ANASTOMOSIS; Surgeon: Ronnald Nian, MD  . Pr remove pelvis lymph nodes  05/13/2013    Procedure: PELVIC LYMPHADENECTOMY W/EXT ILIAC (SEPART PROC); Surgeon: Ronnald Nian, MD    Family History  Problem Relation Age of Onset  . Bladder Cancer Father   . Prostate cancer Father   . Stroke Paternal Uncle    Social History:  reports that he quit smoking about 2 years ago. His smoking use included Cigarettes. He quit after 51 years of use. He has never used smokeless tobacco. He reports that he drinks alcohol. He reports that he does not use illicit drugs.  Allergies: No Known Allergies   (Not in a hospital admission)  Results for orders placed or performed during the hospital encounter of 03/29/16 (from the past 48 hour(s))  Comprehensive metabolic panel     Status: Abnormal   Collection Time: 03/29/16  9:28 AM  Result Value Ref Range   Sodium 128 (L) 135 - 145 mmol/L   Potassium 4.2 3.5 - 5.1 mmol/L   Chloride 96 (L) 101 - 111 mmol/L   CO2 23 22 - 32 mmol/L   Glucose, Bld 132 (H) 65 - 99 mg/dL   BUN 19 6 - 20 mg/dL   Creatinine, Ser 1.73 (H) 0.61 - 1.24 mg/dL   Calcium 9.0 8.9 - 10.3 mg/dL  Total Protein 7.9 6.5 - 8.1 g/dL   Albumin 4.0 3.5 - 5.0 g/dL   AST 14 (L) 15 - 41 U/L   ALT 11 (L) 17 - 63 U/L   Alkaline Phosphatase 79 38 - 126 U/L   Total Bilirubin 1.0 0.3 - 1.2 mg/dL   GFR calc non Af Amer 39 (L) >60 mL/min   GFR calc Af Amer 45 (L) >60 mL/min    Comment: (NOTE) The eGFR has been calculated using the CKD EPI equation. This calculation has not been validated in all clinical situations. eGFR's persistently <60 mL/min signify possible Chronic Kidney Disease.    Anion gap 9 5 - 15  CBC with Differential     Status: Abnormal   Collection Time: 03/29/16  9:28 AM  Result Value Ref Range   WBC 15.2 (H) 3.8 - 10.6 K/uL   RBC 4.56 4.40 - 5.90  MIL/uL   Hemoglobin 13.8 13.0 - 18.0 g/dL   HCT 39.9 (L) 40.0 - 52.0 %   MCV 87.4 80.0 - 100.0 fL   MCH 30.2 26.0 - 34.0 pg   MCHC 34.6 32.0 - 36.0 g/dL   RDW 15.0 (H) 11.5 - 14.5 %   Platelets 251 150 - 440 K/uL   Neutrophils Relative % 85 %   Neutro Abs 13.0 (H) 1.4 - 6.5 K/uL   Lymphocytes Relative 4 %   Lymphs Abs 0.6 (L) 1.0 - 3.6 K/uL   Monocytes Relative 10 %   Monocytes Absolute 1.4 (H) 0.2 - 1.0 K/uL   Eosinophils Relative 0 %   Eosinophils Absolute 0.0 0 - 0.7 K/uL   Basophils Relative 1 %   Basophils Absolute 0.1 0 - 0.1 K/uL  Troponin I     Status: None   Collection Time: 03/29/16  9:28 AM  Result Value Ref Range   Troponin I <0.03 <0.031 ng/mL    Comment:        NO INDICATION OF MYOCARDIAL INJURY.   Protime-INR     Status: Abnormal   Collection Time: 03/29/16  9:28 AM  Result Value Ref Range   Prothrombin Time 15.2 (H) 11.4 - 15.0 seconds   INR 1.18   Lipase, blood     Status: None   Collection Time: 03/29/16  9:28 AM  Result Value Ref Range   Lipase 11 11 - 51 U/L  Magnesium     Status: None   Collection Time: 03/29/16  9:28 AM  Result Value Ref Range   Magnesium 1.8 1.7 - 2.4 mg/dL   US Abdomen Limited Ruq  03/29/2016  CLINICAL DATA:  70 year old male with right upper quadrant abdominal pain EXAM: US ABDOMEN LIMITED - RIGHT UPPER QUADRANT COMPARISON:  Prior CT abdomen/pelvis 02/19/2015 FINDINGS: Gallbladder: Multiple mobile echogenic foci layering in the gallbladder consistent with cholelithiasis. Two of the stones are non mobile within the gallbladder neck. The gallbladder wall is thickened at 5 mm. There is a region of apparent focal disruption of the gallbladder wall along the hepatic surface resulting in a contained pericholecystic fluid collection which measures approximately 1.3 x 3.0 cm. The largest calculus measures up to 1.6 cm. Common bile duct: Diameter: Dilated at 1.3 cm. Liver: No focal lesion identified. Within normal limits in parenchymal  echogenicity. Main portal vein is patent with normal hepatopetal flow. IMPRESSION: 1. Cholelithiasis including 2 stones which appear lodged in the gallbladder neck. There is diffuse gallbladder wall thickening as well as what appears to be a focal rupture of the gallbladder wall  along the hepatic surface with a contained 1.2 x 3.0 cm fluid collection. There is no evidence of free pericholecystic fluid. Sonographic findings are most consistent with either acute or chronic cholecystitis with contained gallbladder wall rupture within the gallbladder fossa. Recommend surgical consultation. 2. Dilated common bile duct at 1.3 cm. The distal common bile duct is obscured by bowel gas. Choledocholithiasis is not excluded by imaging. Electronically Signed   By: Jacqulynn Cadet M.D.   On: 03/29/2016 09:37    Review of Systems  Constitutional: Positive for chills and malaise/fatigue. Negative for weight loss.  HENT: Negative for congestion.   Respiratory: Negative for cough and shortness of breath.   Cardiovascular: Negative for chest pain, palpitations and leg swelling.  Gastrointestinal: Positive for nausea, vomiting, abdominal pain and constipation. Negative for heartburn, diarrhea, blood in stool and melena.  Genitourinary: Negative for dysuria, urgency, frequency and hematuria.  Musculoskeletal: Negative for back pain and joint pain.  Skin: Negative for itching and rash.  Neurological: Negative for dizziness, loss of consciousness, weakness and headaches.  Psychiatric/Behavioral: Negative for depression. The patient is not nervous/anxious.   All other systems reviewed and are negative.   Blood pressure 118/74, pulse 61, temperature 97.9 F (36.6 C), temperature source Oral, resp. rate 19, height _0  (1.803 m), weight 200 lb (90.719 kg), SpO2 96 %. Physical Exam  Vitals reviewed. Constitutional: He is oriented to person, place, and time. He appears well-developed and well-nourished. No distress.   HENT:  Head: Normocephalic and atraumatic.  Right Ear: External ear normal.  Left Ear: External ear normal.  Nose: Nose normal.  Mouth/Throat: Oropharynx is clear and moist. No oropharyngeal exudate.  Eyes: Conjunctivae and EOM are normal. Pupils are equal, round, and reactive to light. No scleral icterus.  Neck: Normal range of motion. Neck supple. No tracheal deviation present.  Cardiovascular: Normal rate, regular rhythm, normal heart sounds and intact distal pulses.  Exam reveals no gallop and no friction rub.   No murmur heard. Respiratory: Breath sounds normal. No respiratory distress. He has no wheezes. He has no rales.  GI: Soft. Bowel sounds are normal. He exhibits no distension and no mass. There is tenderness. There is guarding. There is no rebound.  Pain in epigastric region and RUQ, +Murphy's sign, right sided urosotomy, pink patent with urine in bag. Well healed surgical scars  Musculoskeletal: Normal range of motion. He exhibits no edema or tenderness.  Neurological: He is alert and oriented to person, place, and time. No cranial nerve deficit.  Skin: Skin is warm and dry. No rash noted. No erythema. No pallor.  Psychiatric: He has a normal mood and affect. His behavior is normal. Judgment and thought content normal.     Assessment/Plan 70 year old male with concern for acute cholecystitis with possible abscess and maybe choledocholithiasis.  I personally reviewed the patient's past medical history which includes stage III bladder cancer which she has been in remission for 2 years. I have personally reviewed his laboratory values that do show an elevated white blood cell count of 15 however her enzymes are not elevated with AST and ALT being low and T bili at 1.0. I have personally reviewed the patient's ultrasound images which do show a slightly thickened gallbladder wall with a small area of outpouching that could either be an abscess or potentially a tumor as well as a dilated  common bile duct to 1.3 cm. I also personally reviewed his past CT scans would do show gallstones but no other abnormalities  in the right upper quadrant. I've also reviewed the radiology reads as above.   I discussed with the patient that given the results on his imaging unsure if he has choledocholithiasis or if he have straightforward Acute cholecystitis verses an abscess going into the liver bed versus a tumor and will need to do an MRCP to further evaluate this area. If patient does have choledocholithiasis likely will need ERCP. If patient does have an abscess into the liver will need a cholecystostomy tube. If it is straightforward acute cholecystitis, he will have the option between a cholecystostomy tube and an open cholecystectomy due to the proximity of his urostomy do not believe this can be done laparoscopically. I will admit him and start him on antibiotics, Zosyn, and pain control and awaiting MRCP to make further decisions.  The patient and wife were given the opportunity to ask questions and have them answered and are in agreement with the treatment plan above.   Time spent with patient was 90 minutes, with more than 50% of the time spent counseling and coordinating care of patient  Hubbard Robinson, MD 03/29/2016, 11:26 AM

## 2016-03-29 NOTE — ED Notes (Signed)
Patient in ultrasound. Patient's wife made aware upon arrival.

## 2016-03-29 NOTE — ED Provider Notes (Addendum)
[facility] Emergency Department Provider Note  ____________________________________________   I have reviewed the triage vital signs and the nursing notes.   HISTORY  Chief Complaint Abdominal Pain    HPI Rodney Day is a 70 y.o. male who has a history of bladder cancer status post resection with a transurethral procedure. Has a history of a cystectomy, patient also has recurrent CT scans which have shown no evidence of recurrence of his cancer but the last CT scan did show significant burden of gallstones. Patient has never had right upper quadrant pain before he states but on Thursday he began to have right upper quadrant pain which is persistent. Decreased appetite. Did vomit on Thursday has not vomited since. This is Saturday morning. Pain is been worsening. Denies any fever or chills. Denies any change in his urostomy output. States his temperature is been normal but sometimes when he is been vomiting he has been sweaty. Denies any chest pain or shortness of breath. Does believe that the pain was worse with food. Focal pain to the right upper quadrant is exactly where he hurts. It is a sharp pain. Does not radiate. Significant discomfort.     Past Medical History  Diagnosis Date  . Malignant neoplasm of bladder [facility])     Patient Active Problem List   Diagnosis Date Noted  . Bladder cancer ([place]) 05/25/2015    Past Surgical History  Procedure Laterality Date  . Transurethral resection of bladder tumor    . Pr cystectomy,ileal conduit/sigmoid bladder  05/13/2013    CYSTECTOMY, COMPLETE, WITH URETEROILEAL CONDUIT OR SIGMOID BLADDER, INCLUDING INTESTINE ANASTOMOSIS; Surgeon: Ronnald Nian, MD  . Pr remove pelvis lymph nodes  05/13/2013    Procedure: PELVIC LYMPHADENECTOMY W/EXT ILIAC (SEPART PROC); Surgeon: Ronnald Nian, MD    Current Outpatient Rx  Name  Route  Sig  Dispense  Refill  . Aspirin-Salicylamide-Caffeine (BC HEADACHE POWDER  PO)   Oral   Take by mouth.         Marland Kitchen ibuprofen (ADVIL,MOTRIN) 200 MG tablet   Oral   Take 200 mg by mouth.           Allergies Review of patient's allergies indicates no known allergies.  Family History  Problem Relation Age of Onset  . Bladder Cancer Father   . Prostate cancer Father     Social History Social History  Substance Use Topics  . Smoking status: Former Smoker -- 51 years    Types: Cigarettes    Quit date: 07/25/2013  . Smokeless tobacco: Never Used  . Alcohol Use: 0.0 oz/week    0 Standard drinks or equivalent per week     Comment: 1 beer every 2 weeks    Review of Systems Constitutional: See history of present illness Eyes: No visual changes. ENT: No sore throat. No stiff neck no neck pain Cardiovascular: Denies chest pain. Respiratory: Denies shortness of breath. Gastrointestinal:   Positive vomiting.  No diarrhea.  No constipation. Genitourinary: Negative for dysuria. Musculoskeletal: Negative lower extremity swelling Skin: Negative for rash. Neurological: Negative for headaches, focal weakness or numbness. 10-point ROS otherwise negative.  ____________________________________________   PHYSICAL EXAM:  VITAL SIGNS: ED Triage Vitals  Enc Vitals Group     BP 03/29/16 0813 94/79 mmHg     Pulse Rate 03/29/16 0813 98     Resp --      Temp 03/29/16 0813 97.9 F (36.6 C)     Temp Source 03/29/16 0813 Oral  SpO2 03/29/16 0813 97 %     Weight 03/29/16 0813 200 lb (90.719 kg)     Height 03/29/16 0813 5\' 11"  (1.803 m)     Head Cir --      Peak Flow --      Pain Score 03/29/16 0815 10     Pain Loc --      Pain Edu? --      Excl. in [place]? --     Constitutional: Alert and oriented. Well appearing and in no acute distress. Eyes: Conjunctivae are normal. PERRL. EOMI. Head: Atraumatic. Nose: No congestion/rhinnorhea. Mouth/Throat: Mucous membranes are moist.  Oropharynx non-erythematous. Neck: No stridor.   Nontender with no  meningismus Cardiovascular: Normal rate, regular rhythm. Grossly normal heart sounds.  Good peripheral circulation. Respiratory: Normal respiratory effort.  No retractions. Lungs CTAB. Abdominal: Soft and Focally tender to the right upper quadrant with voluntary guarding but no rebound nonsurgical abdomen urostomy is in place in the right lower quadrant with yellow urine and no evidence of peripheral infection.. No distention. s there are no lesions noted. there is no CVA tenderness Musculoskeletal: No lower extremity tenderness. No joint effusions, no DVT signs strong distal pulses no edema Neurologic:  Normal speech and language. No gross focal neurologic deficits are appreciated.  Skin:  Skin is warm, dry and intact. No rash noted. Psychiatric: Mood and affect are normal. Speech and behavior are normal.  ____________________________________________   LABS (all labs ordered are listed, but only abnormal results are displayed)  Labs Reviewed  COMPREHENSIVE METABOLIC PANEL  CBC WITH DIFFERENTIAL/PLATELET  TROPONIN I  PROTIME-INR  LIPASE, BLOOD  MAGNESIUM  URINALYSIS COMPLETEWITH MICROSCOPIC ([place])   ____________________________________________  EKG  I personally interpreted any EKGs ordered by me or triage Sinus rhythm rate 87 bpm, PVCs noted, nonspecific ST changes no acute ST elevation or depression ____________________________________________  RADIOLOGY  I reviewed any imaging ordered by me or triage that were performed during my shift and, if possible, patient and/or family made aware of any abnormal findings. ____________________________________________   PROCEDURES  Procedure(s) performed: None  Critical Care performed: None  ____________________________________________   INITIAL IMPRESSION / ASSESSMENT AND PLAN / ED COURSE  Pertinent labs & imaging results that were available during my care of the patient were reviewed by me and considered in my medical  decision making (see chart for details).  Patient with focal right upper quadrant pain worse with food and a known history of gallstones, this is most consistent with gallbladder disease. We will obtain ultrasound imaging and blood work and reassess. We will give him pain control and antiemetics IV fluid  ----------------------------------------- 9:56 AM on 03/29/2016 -----------------------------------------  Discussed with Dr. Sherre Poot surgery who does agree with antibiotics will come evaluate the patient. Blood pressure is improving, patient is in no distress at this time abdomen is still tender but he is much more comfortable after pain medication, liver function tests are pending.  ____________________________________________   FINAL CLINICAL IMPRESSION(S) / ED DIAGNOSES  Final diagnoses:  Abdominal pain      This chart was dictated using voice recognition software.  Despite best efforts to proofread,  errors can occur which can change meaning.     Schuyler Amor, MD 03/29/16 [id number]  Schuyler Amor, MD 03/29/16 [phone]

## 2016-03-29 NOTE — ED Notes (Addendum)
Patient to ER for c/o severe abdominal pain to RUQ and midline abdomen since Thursday. +N/V. Denies fever or diarrhea. Has urostomy in place at RLQ. States has been draining adequately.

## 2016-03-30 DIAGNOSIS — K8062 Calculus of gallbladder and bile duct with acute cholecystitis without obstruction: Secondary | ICD-10-CM | POA: Diagnosis not present

## 2016-03-30 LAB — COMPREHENSIVE METABOLIC PANEL
ALBUMIN: 2.8 g/dL — AB (ref 3.5–5.0)
ALT: 8 U/L — ABNORMAL LOW (ref 17–63)
AST: 11 U/L — AB (ref 15–41)
Alkaline Phosphatase: 55 U/L (ref 38–126)
Anion gap: 5 (ref 5–15)
BUN: 17 mg/dL (ref 6–20)
CHLORIDE: 107 mmol/L (ref 101–111)
CO2: 21 mmol/L — AB (ref 22–32)
Calcium: 8.2 mg/dL — ABNORMAL LOW (ref 8.9–10.3)
Creatinine, Ser: 1.44 mg/dL — ABNORMAL HIGH (ref 0.61–1.24)
GFR calc Af Amer: 56 mL/min — ABNORMAL LOW (ref >60)
GFR calc non Af Amer: 48 mL/min — ABNORMAL LOW (ref >60)
GLUCOSE: 122 mg/dL — AB (ref 65–99)
POTASSIUM: 3.9 mmol/L (ref 3.5–5.1)
SODIUM: 133 mmol/L — AB (ref 135–145)
Total Bilirubin: 0.7 mg/dL (ref 0.3–1.2)
Total Protein: 5.8 g/dL — ABNORMAL LOW (ref 6.5–8.1)

## 2016-03-30 LAB — CBC
HEMATOCRIT: 33.2 % — AB (ref 40.0–52.0)
HEMOGLOBIN: 11.3 g/dL — AB (ref 13.0–18.0)
MCH: 30.4 pg (ref 26.0–34.0)
MCHC: 34.1 g/dL (ref 32.0–36.0)
MCV: 89.1 fL (ref 80.0–100.0)
Platelets: 189 10*3/uL (ref 150–440)
RBC: 3.72 MIL/uL — ABNORMAL LOW (ref 4.40–5.90)
RDW: 14.7 % — AB (ref 11.5–14.5)
WBC: 11.2 10*3/uL — ABNORMAL HIGH (ref 3.8–10.6)

## 2016-03-30 NOTE — Consult Note (Signed)
GI Inpatient Consult Note  Reason for Consult: Abnormal MRCP.   Attending Requesting Consult:  History of Present Illness: Rodney Day is a 70 y.o. male with acute RUQ abdominal pain, found with acute cholecystitis and abscess. LFT normal but MRCP shows possible CBD stones. Feels better on Abx and pain meds. NPO right now. Hx of bladder cancer, s/p urostomy in place. Wife wanted to know if ERCP can be done at a tertiary center, such as [facility], where pt had his bladder surgery.  Past Medical History:  Past Medical History  Diagnosis Date  . Malignant neoplasm of bladder [facility])     Problem List: Patient Active Problem List   Diagnosis Date Noted  . Cholelithiasis with cholecystitis or obstruction 03/29/2016  . Bladder cancer ([place]) 05/25/2015    Past Surgical History: Past Surgical History  Procedure Laterality Date  . Transurethral resection of bladder tumor    . Pr cystectomy,ileal conduit/sigmoid bladder  05/13/2013    CYSTECTOMY, COMPLETE, WITH URETEROILEAL CONDUIT OR SIGMOID BLADDER, INCLUDING INTESTINE ANASTOMOSIS; Surgeon: Ronnald Nian, MD  . Pr remove pelvis lymph nodes  05/13/2013    Procedure: PELVIC LYMPHADENECTOMY W/EXT ILIAC (SEPART PROC); Surgeon: Ronnald Nian, MD    Allergies: No Known Allergies  Home Medications: Prescriptions prior to admission  Medication Sig Dispense Refill Last Dose  . Aspirin-Salicylamide-Caffeine (BC HEADACHE POWDER PO) Take 1 packet by mouth daily as needed (for arthritis and joint pain.).    03/15/2016  . oxyCODONE-acetaminophen (PERCOCET/ROXICET) 5-325 MG tablet Take 1-2 tablets by mouth every 4 (four) hours as needed for moderate pain or severe pain.   03/29/2016 at Unknown time   Home medication reconciliation was completed with the patient.   Scheduled Inpatient Medications:   . cyclobenzaprine  10 mg Oral TID  . ketorolac  15 mg Intravenous 4 times per day  . pantoprazole (PROTONIX) IV  40 mg Intravenous QHS  .  piperacillin-tazobactam (ZOSYN)  IV  3.375 g Intravenous 3 times per day  . pneumococcal 23 valent vaccine  0.5 mL Intramuscular Tomorrow-1000    Continuous Inpatient Infusions:   . dextrose 5 % and 0.9% NaCl 150 mL/hr at 03/30/16 0510    PRN Inpatient Medications:  HYDROmorphone (DILAUDID) injection, ondansetron **OR** ondansetron (ZOFRAN) IV, oxyCODONE-acetaminophen  Family History: family history includes Bladder Cancer in his father; Prostate cancer in his father; Stroke in his paternal uncle.  The patient's family history is negative for inflammatory bowel disorders, GI malignancy, or solid organ transplantation.  Social History:   reports that he has been smoking Cigarettes.  He has a 12.75 pack-year smoking history. He has never used smokeless tobacco. He reports that he drinks alcohol. He reports that he does not use illicit drugs. The patient denies ETOH, tobacco, or drug use.   Review of Systems: Constitutional: Weight is stable.  Eyes: No changes in vision. ENT: No oral lesions, sore throat.  GI: see HPI.  Heme/Lymph: No easy bruising.  CV: No chest pain.  GU: No hematuria.  Integumentary: No rashes.  Neuro: No headaches.  Psych: No depression/anxiety.  Endocrine: No heat/cold intolerance.  Allergic/Immunologic: No urticaria.  Resp: No cough, SOB.  Musculoskeletal: No joint swelling.    Physical Examination: BP 118/62 mmHg  Pulse 73  Temp(Src) 99.5 F (37.5 C) (Oral)  Resp 24  Ht 5\' 11"  (1.803 m)  Wt 88.905 kg (196 lb)  BMI 27.35 kg/m2  SpO2 94% Gen: NAD, alert and oriented x 4 HEENT: PEERLA, EOMI, Neck: supple, no JVD  or thyromegaly Chest: CTA bilaterally, no wheezes, crackles, or other adventitious sounds CV: RRR, no m/g/c/r Abd: soft, mild RUQ tenderness, urostomy in place, ND, +BS in all four quadrants; no HSM, guarding, ridigity, or rebound tenderness Ext: no edema, well perfused with 2+ pulses, Skin: no rash or lesions noted Lymph: no  LAD  Data: Lab Results  Component Value Date   WBC 11.2* 03/30/2016   HGB 11.3* 03/30/2016   HCT 33.2* 03/30/2016   MCV 89.1 03/30/2016   PLT 189 03/30/2016    Recent Labs Lab 03/29/16 0928 03/30/16 0649  HGB 13.8 11.3*   Lab Results  Component Value Date   NA 133* 03/30/2016   K 3.9 03/30/2016   CL 107 03/30/2016   CO2 21* 03/30/2016   BUN 17 03/30/2016   CREATININE 1.44* 03/30/2016   Lab Results  Component Value Date   ALT 8* 03/30/2016   AST 11* 03/30/2016   ALKPHOS 55 03/30/2016   BILITOT 0.7 03/30/2016    Recent Labs Lab 03/29/16 0928  INR 1.18   Assessment/Plan: Mr. Lantigua is a 70 y.o. male with possible CBD stones. LFT remains normal.  Recommendations: Discussed in detail with pt and wife regarding ERCP as well as potential complications. With his cholecystitis and abscess, pt may require open cholecystectomy. If so, CBD exploration can be done at the time of surgery. There is no urgency in doing ERCP since LFT normal. However, if pt and surgery wants it scheduled tomorrow, make sure heparin sq is stopped tomorrow AM. It may be very late in the day before I can arrange the procedure. If pt and wife wants ERCP done elsewhere, then I will sign off.  Thank you for the consult. Please call with questions or concerns.  Antoni Stefan, Lupita Dawn, MD

## 2016-03-30 NOTE — Progress Notes (Signed)
70 yr old male with acute cholecystitis with small abscess adjacent in liver and CBD dilation.  Patient states pain much improved, only when coughing or direct pressure over the area now.  He denies any nausea and would like to try something to drink.    Filed Vitals:   03/30/16 0829 03/30/16 1334  BP: 118/62 110/62  Pulse: 73 63  Temp: 99.5 F (37.5 C) 98.2 F (36.8 C)  Resp: 24 16   I/O last 3 completed shifts: In: 2034.7 [I.V.:1954.1; IV Piggyback:80.6] Out: 450 [Urine:450] Total I/O In: -  Out: 300 [Urine:300]   PE:  Gen: NAD  Res: CTAB/L  Cardio: RRR Abd: soft, mild tenderness in RUQ, urostomy pink and patent Ext: 2+ pulse, no edema  CBC Latest Ref Rng 03/30/2016 03/29/2016 11/23/2015  WBC 3.8 - 10.6 K/uL 11.2(H) 15.2(H) 5.0  Hemoglobin 13.0 - 18.0 g/dL 11.3(L) 13.8 14.1  Hematocrit 40.0 - 52.0 % 33.2(L) 39.9(L) 41.8  Platelets 150 - 440 K/uL 189 251 280    CMP Latest Ref Rng 03/30/2016 03/29/2016 11/23/2015  Glucose 65 - 99 mg/dL 122(H) 132(H) 127(H)  BUN 6 - 20 mg/dL 17 19 16   Creatinine 0.61 - 1.24 mg/dL 1.44(H) 1.73(H) 1.81(H)  Sodium 135 - 145 mmol/L 133(L) 128(L) 133(L)  Potassium 3.5 - 5.1 mmol/L 3.9 4.2 4.2  Chloride 101 - 111 mmol/L 107 96(L) 98(L)  CO2 22 - 32 mmol/L 21(L) 23 27  Calcium 8.9 - 10.3 mg/dL 8.2(L) 9.0 9.6  Total Protein 6.5 - 8.1 g/dL 5.8(L) 7.9 -  Total Bilirubin 0.3 - 1.2 mg/dL 0.7 1.0 -  Alkaline Phos 38 - 126 U/L 55 79 -  AST 15 - 41 U/L 11(L) 14(L) -  ALT 17 - 63 U/L 8(L) 11(L) -    A/P:  70 yr old male with acute cholecystitis with small abscess adjacent in liver and CBD dilation.  Patient much improved on Zosyn alone.  I discussed with the patient that given the abscess would not recommend operation now but 8-12 weeks when inflammation decreases.  Since he has had such significant improvement without cholecystostomy tube will attempt to avoid for now.  Patient understands that if his pain or nausea returns with the clear liquids he will  need a cholecystostomy tube placement.  Appreciate GI help, he does of CBD dilation and two small 67mm stones in the duct, but LFTs remain normal, does not recommend ERCP today, will get LFTs in AM, wife is ok with procedure here if needed.

## 2016-03-31 DIAGNOSIS — K8067 Calculus of gallbladder and bile duct with acute and chronic cholecystitis with obstruction: Secondary | ICD-10-CM

## 2016-03-31 LAB — COMPREHENSIVE METABOLIC PANEL
ALBUMIN: 2.6 g/dL — AB (ref 3.5–5.0)
ALK PHOS: 59 U/L (ref 38–126)
ALT: 8 U/L — ABNORMAL LOW (ref 17–63)
ANION GAP: 6 (ref 5–15)
AST: 11 U/L — ABNORMAL LOW (ref 15–41)
BUN: 18 mg/dL (ref 6–20)
CALCIUM: 7.9 mg/dL — AB (ref 8.9–10.3)
CHLORIDE: 107 mmol/L (ref 101–111)
CO2: 20 mmol/L — AB (ref 22–32)
Creatinine, Ser: 1.48 mg/dL — ABNORMAL HIGH (ref 0.61–1.24)
GFR calc non Af Amer: 47 mL/min — ABNORMAL LOW (ref >60)
GFR, EST AFRICAN AMERICAN: 54 mL/min — AB (ref >60)
GLUCOSE: 107 mg/dL — AB (ref 65–99)
POTASSIUM: 3.5 mmol/L (ref 3.5–5.1)
Sodium: 133 mmol/L — ABNORMAL LOW (ref 135–145)
Total Bilirubin: 0.6 mg/dL (ref 0.3–1.2)
Total Protein: 5.9 g/dL — ABNORMAL LOW (ref 6.5–8.1)

## 2016-03-31 LAB — CBC
HCT: 32.3 % — ABNORMAL LOW (ref 40.0–52.0)
Hemoglobin: 11 g/dL — ABNORMAL LOW (ref 13.0–18.0)
MCH: 30.1 pg (ref 26.0–34.0)
MCHC: 34 g/dL (ref 32.0–36.0)
MCV: 88.4 fL (ref 80.0–100.0)
PLATELETS: 196 10*3/uL (ref 150–440)
RBC: 3.65 MIL/uL — ABNORMAL LOW (ref 4.40–5.90)
RDW: 14.9 % — AB (ref 11.5–14.5)
WBC: 7.2 10*3/uL (ref 3.8–10.6)

## 2016-03-31 MED ORDER — AMOXICILLIN-POT CLAVULANATE 875-125 MG PO TABS: 1.0000 | ORAL_TABLET | Freq: Two times a day (BID) | ORAL | Status: DC

## 2016-03-31 MED ORDER — OXYCODONE-ACETAMINOPHEN 5-325 MG PO TABS: 2.0000 | ORAL_TABLET | ORAL | Status: DC | PRN

## 2016-03-31 NOTE — Care Management Important Message (Signed)
Important Message  Patient Details  Name: Rodney Day MRN: [id number] Date of Birth: 03-May-1946   Medicare Important Message Given:  Yes    Juliann Pulse A Amyr Sluder 03/31/2016, 1:32 PM

## 2016-03-31 NOTE — Consult Note (Signed)
  GI Inpatient Follow-up Note  Patient Identification: Rodney Day is a 70 y.o. male with choledocholithiasis.  Subjective: Continues to feel better. Tolerated clears. LFT remains normal. Scheduled Inpatient Medications:  . cyclobenzaprine  10 mg Oral TID  . ketorolac  15 mg Intravenous 4 times per day  . pantoprazole (PROTONIX) IV  40 mg Intravenous QHS  . piperacillin-tazobactam (ZOSYN)  IV  3.375 g Intravenous 3 times per day    Continuous Inpatient Infusions:   . dextrose 5 % and 0.9% NaCl 150 mL/hr at 03/31/16 0643    PRN Inpatient Medications:  ondansetron **OR** ondansetron (ZOFRAN) IV, oxyCODONE-acetaminophen  Review of Systems: Constitutional: Weight is stable.  Eyes: No changes in vision. ENT: No oral lesions, sore throat.  GI: see HPI.  Heme/Lymph: No easy bruising.  CV: No chest pain.  GU: No hematuria.  Integumentary: No rashes.  Neuro: No headaches.  Psych: No depression/anxiety.  Endocrine: No heat/cold intolerance.  Allergic/Immunologic: No urticaria.  Resp: No cough, SOB.  Musculoskeletal: No joint swelling.    Physical Examination: BP 115/59 mmHg  Pulse 67  Temp(Src) 98.1 F (36.7 C) (Oral)  Resp 18  Ht 5\' 11"  (1.803 m)  Wt 88.905 kg (196 lb)  BMI 27.35 kg/m2  SpO2 95% Gen: NAD, alert and oriented x 4 HEENT: PEERLA, EOMI, Neck: supple, no JVD or thyromegaly Chest: CTA bilaterally, no wheezes, crackles, or other adventitious sounds CV: RRR, no m/g/c/r Abd: soft, NT, ND, +BS in all four quadrants; no HSM, guarding, ridigity, or rebound tenderness Ext: no edema, well perfused with 2+ pulses, Skin: no rash or lesions noted Lymph: no LAD  Data: Lab Results  Component Value Date   WBC 7.2 03/31/2016   HGB 11.0* 03/31/2016   HCT 32.3* 03/31/2016   MCV 88.4 03/31/2016   PLT 196 03/31/2016    Recent Labs Lab 03/29/16 0928 03/30/16 0649 03/31/16 0612  HGB 13.8 11.3* 11.0*   Lab Results  Component Value Date   NA 133*  03/31/2016   K 3.5 03/31/2016   CL 107 03/31/2016   CO2 20* 03/31/2016   BUN 18 03/31/2016   CREATININE 1.48* 03/31/2016   Lab Results  Component Value Date   ALT 8* 03/31/2016   AST 11* 03/31/2016   ALKPHOS 59 03/31/2016   BILITOT 0.6 03/31/2016    Recent Labs Lab 03/29/16 0928  INR 1.18   Assessment/Plan: Mr. Rodney Day is a 70 y.o. male with cholecystitis/CBD stones. On Abx. Clinicially improving.  Recommendations: As long as he is clinically stable on Abx and LFT remains normal, no urgency in scheduling ERCP. Will probably set it up later this week, Thurs or Fri. Please call with questions or concerns.  Burk Hoctor, Lupita Dawn, MD

## 2016-03-31 NOTE — Final Progress Note (Signed)
CC: Abdominal pain Subjective: Patient states that his pain is completely gone. He is hungry and desiring a more substantial meal. He has been having bowel function.  Objective: Vital signs in last 24 hours: Temp:  [97.8 F (36.6 C)-98.7 F (37.1 C)] 98.7 F (37.1 C) (04/17 1255) Pulse Rate:  [53-67] 60 (04/17 1255) Resp:  [17-18] 17 (04/17 1255) BP: (101-137)/(58-71) 137/71 mmHg (04/17 1255) SpO2:  [95 %-99 %] 99 % (04/17 1255) Last BM Date: 03/26/16  Intake/Output from previous day: 04/16 0701 - 04/17 0700 In: 2911 [P.O.:600; I.V.:2213; IV Piggyback:98] Out: 1250 [Urine:1250] Intake/Output this shift: Total I/O In: 650 [P.O.:600; IV Piggyback:50] Out: 500 [Urine:500]  Physical exam:  Gen.: No acute distress Chest: Clear to auscultation Heart: Very alert and rhythm Abdomen: Soft, nontender, nondistended. Urostomy present in the right lower quadrant that is functioning normally  Lab Results: CBC   Recent Labs  03/30/16 0649 03/31/16 0612  WBC 11.2* 7.2  HGB 11.3* 11.0*  HCT 33.2* 32.3*  PLT 189 196   BMET  Recent Labs  03/30/16 0649 03/31/16 0612  NA 133* 133*  K 3.9 3.5  CL 107 107  CO2 21* 20*  GLUCOSE 122* 107*  BUN 17 18  CREATININE 1.44* 1.48*  CALCIUM 8.2* 7.9*   PT/INR  Recent Labs  03/29/16 0928  LABPROT 15.2*  INR 1.18   ABG No results for input(s): PHART, HCO3 in the last 72 hours.  Invalid input(s): PCO2, PO2  Studies/Results: Mr Abd W/wo Cm/mrcp  03/29/2016  CLINICAL DATA:  Stage III bladder cancer. Right upper quadrant abdominal pain and nausea with vomiting. Acute calculous cholecystitis with possible gallbladder rupture and dilated common bile duct on abdominal sonogram from earlier today. EXAM: MRI ABDOMEN WITHOUT AND WITH CONTRAST (INCLUDING MRCP) TECHNIQUE: Multiplanar multisequence MR imaging of the abdomen was performed both before and after the administration of intravenous contrast. Heavily T2-weighted images of the  biliary and pancreatic ducts were obtained, and three-dimensional MRCP images were rendered by post processing. CONTRAST:  37mL MULTIHANCE GADOBENATE DIMEGLUMINE 529 MG/ML IV SOLN COMPARISON:  03/29/2016 abdominal sonogram. 02/19/2015 CT abdomen/pelvis. FINDINGS: Motion degraded study, with motion degradation most prominent on the MRCP sequence. Lower chest: Curvilinear opacities in the dependent right greater than left lung bases likely represent subsegmental atelectasis. Hepatobiliary: Normal liver size and configuration. No hepatic steatosis. No liver mass. Mildly distended gallbladder contains several gallstones measuring up to 10 mm in size, including a 10 mm gallstone in the gallbladder neck. Mild diffuse gallbladder wall thickening. There is a focal 11 mm discontinuity in the superior gallbladder wall (series 7/ image 35) with adjacent small 3.9 x 1.2 cm pericholecystic fluid collection. Findings are in keeping with perforated acute calculous cholecystitis as described on the sonogram from earlier today. Mild diffuse central intrahepatic biliary ductal dilatation and dilated common bile duct (10 mm diameter). There are two tiny adjacent 3 mm low signal filling defects in the dependent lower third of the common bile duct, which could represent tiny stones. Otherwise no choledocholithiasis or strictures in the biliary tree. Pancreas: No pancreatic mass or duct dilation.  No pancreas divisum. Spleen: Normal size. No mass. Adrenals/Urinary Tract: Normal adrenals. No hydronephrosis. Stable chronic asymmetric right renal atrophy. No renal masses. Stomach/Bowel: Grossly normal stomach. Visualized small bowel is normal caliber, with no bowel wall thickening. Mild diffuse gaseous distention of the colon. Vascular/Lymphatic: Stable ectasia of the atherosclerotic infrarenal abdominal aorta, maximum diameter 2.9 cm. Patent portal, splenic, hepatic and renal veins. No pathologically enlarged  lymph nodes in the abdomen.  Other: No abdominal ascites. Musculoskeletal: No aggressive appearing focal osseous lesions. IMPRESSION: 1. Acute perforated calculus cholecystitis with associated small pericholecystic fluid collection, as described on the sonogram report from earlier today. 1 cm gallstone is lodged in the gallbladder neck. 2. Dilated common bile duct (10 mm diameter) with diffuse mild central intrahepatic biliary ductal dilatation. Two tiny clustered 3 mm low signal filling defects in the dependent lower third of the common bile duct, suggestive of tiny stones or debris. 3. Stable ectatic atherosclerotic infrarenal abdominal aorta, maximum diameter 2.9 cm. Ectatic abdominal aorta at risk for aneurysm development. Recommend followup by ultrasound in 5 years. This recommendation follows ACR consensus guidelines: White Paper of the ACR Incidental Findings Committee II on Vascular Findings. J Am Coll Radiol 2013; 10:789-794. 4. Stable chronic asymmetric right renal atrophy. Electronically Signed   By: Ilona Sorrel M.D.   On: 03/29/2016 18:58    Anti-infectives: Anti-infectives    Start     Dose/Rate Route Frequency Ordered Stop   03/31/16 1530  amoxicillin-clavulanate (AUGMENTIN) 875-125 MG per tablet 1 tablet     1 tablet Oral Every 12 hours 03/31/16 1520     03/31/16 0000  amoxicillin-clavulanate (AUGMENTIN) 875-125 MG tablet     1 tablet Oral Every 12 hours 03/31/16 1529     03/29/16 1400  piperacillin-tazobactam (ZOSYN) IVPB 3.375 g  Status:  Discontinued     3.375 g 12.5 mL/hr over 240 Minutes Intravenous 3 times per day 03/29/16 1124 03/31/16 1520   03/29/16 1000  piperacillin-tazobactam (ZOSYN) IVPB 3.375 g     3.375 g 100 mL/hr over 30 Minutes Intravenous  Once 03/29/16 0946 03/29/16 1203      Assessment/Plan:  70 year old male admitted with acute cholecystitis that was confirmed via ultrasound and MRCP. Patient's symptoms completely resolved while on IV antibiotics. Transition to oral antibiotics today  and diet advanced today. Patient tolerated both and has had bowel function. He desires to go home. Plan for discharge home today with follow-up in clinic in 2 weeks.  Basem Yannuzzi T. Adonis Huguenin, MD, FACS  03/31/2016

## 2016-03-31 NOTE — Discharge Instructions (Signed)
° °

## 2016-03-31 NOTE — Discharge Summary (Signed)
Patient ID: Rodney Day MRN: [id number] DOB/AGE: 03-11-1946 70 y.o.  Admit date: 03/29/2016 Discharge date: 03/31/2016  Discharge Diagnoses:  Cholecystitis  Procedures Performed: None  Discharged Condition: good  Hospital Course: Patient that if in the emergency department with a questionable diagnosis of acute cholecystitis. Diagnosis was confirmed via multiple imaging modalities. However patient's symptoms completely resolved while on antibiotics. His labs also normalized. Was able to tolerate a regular diet and transitioned to oral antibiotics prior to discharge.  Discharge Orders:  discharge home  Disposition: Home  Discharge Medications:   Medication List    TAKE these medications        amoxicillin-clavulanate 875-125 MG tablet  Commonly known as:  AUGMENTIN  Take 1 tablet by mouth every 12 (twelve) hours.     BC HEADACHE POWDER PO  Take 1 packet by mouth daily as needed (for arthritis and joint pain.).     oxyCODONE-acetaminophen 5-325 MG tablet  Commonly known as:  PERCOCET/ROXICET  Take 2 tablets by mouth every 4 (four) hours as needed for moderate pain or severe pain.         Follwup: Follow-up Information    Follow up with [facility]  In 2 weeks.   Specialty:  General Surgery   Why:  hospital follow up   Contact information:   [address] [place] [phone]      Signed: Clayburn Pert 03/31/2016, 3:30 PM

## 2016-04-08 ENCOUNTER — Encounter: Payer: Self-pay | Admitting: *Deleted

## 2016-04-09 ENCOUNTER — Ambulatory Visit: Payer: Medicare Other | Admitting: Certified Registered Nurse Anesthetist

## 2016-04-09 ENCOUNTER — Encounter: Payer: Self-pay | Admitting: Anesthesiology

## 2016-04-09 ENCOUNTER — Encounter
Admission: RE | Disposition: A | Discharge status: Home / Self Care | Payer: Self-pay | Source: Ambulatory Visit | Attending: Gastroenterology

## 2016-04-09 ENCOUNTER — Ambulatory Visit
Admission: RE | Admit: 2016-04-09 | Discharge: 2016-04-09 | Disposition: A | Discharge status: Home / Self Care | Payer: Medicare Other | Source: Ambulatory Visit | Attending: Gastroenterology | Admitting: Gastroenterology

## 2016-04-09 ENCOUNTER — Ambulatory Visit: Payer: Medicare Other

## 2016-04-09 DIAGNOSIS — Z87891 Personal history of nicotine dependence: Secondary | ICD-10-CM | POA: Diagnosis not present

## 2016-04-09 DIAGNOSIS — Z79899 Other long term (current) drug therapy: Secondary | ICD-10-CM | POA: Diagnosis not present

## 2016-04-09 DIAGNOSIS — K8050 Calculus of bile duct without cholangitis or cholecystitis without obstruction: Secondary | ICD-10-CM | POA: Diagnosis present

## 2016-04-09 DIAGNOSIS — Z8052 Family history of malignant neoplasm of bladder: Secondary | ICD-10-CM | POA: Insufficient documentation

## 2016-04-09 DIAGNOSIS — Z7982 Long term (current) use of aspirin: Secondary | ICD-10-CM | POA: Insufficient documentation

## 2016-04-09 DIAGNOSIS — R1011 Right upper quadrant pain: Secondary | ICD-10-CM | POA: Diagnosis not present

## 2016-04-09 DIAGNOSIS — Z8551 Personal history of malignant neoplasm of bladder: Secondary | ICD-10-CM | POA: Diagnosis not present

## 2016-04-09 DIAGNOSIS — R933 Abnormal findings on diagnostic imaging of other parts of digestive tract: Secondary | ICD-10-CM | POA: Diagnosis not present

## 2016-04-09 DIAGNOSIS — Z823 Family history of stroke: Secondary | ICD-10-CM | POA: Diagnosis not present

## 2016-04-09 DIAGNOSIS — Z8042 Family history of malignant neoplasm of prostate: Secondary | ICD-10-CM | POA: Diagnosis not present

## 2016-04-09 DIAGNOSIS — R932 Abnormal findings on diagnostic imaging of liver and biliary tract: Secondary | ICD-10-CM | POA: Diagnosis not present

## 2016-04-09 HISTORY — PX: ERCP: SHX5425

## 2016-04-09 SURGERY — ERCP, WITH INTERVENTION IF INDICATED
Anesthesia: General

## 2016-04-09 MED ORDER — LIDOCAINE HCL (PF) 1 % IJ SOLN
2.0000 mL | Freq: Once | INTRAMUSCULAR | Status: AC
  Administered 2016-04-09: 0.03 mL via INTRADERMAL

## 2016-04-09 MED ORDER — PROPOFOL 10 MG/ML IV BOLUS
INTRAVENOUS | Status: DC | PRN
  Administered 2016-04-09: 50 mg via INTRAVENOUS
  Administered 2016-04-09: 140 mg via INTRAVENOUS

## 2016-04-09 MED ORDER — SODIUM CHLORIDE 0.9 % IV SOLN: INTRAVENOUS | Status: DC

## 2016-04-09 MED ORDER — LIDOCAINE HCL (CARDIAC) 20 MG/ML IV SOLN
INTRAVENOUS | Status: DC | PRN
  Administered 2016-04-09: 50 mg via INTRAVENOUS

## 2016-04-09 MED ORDER — LIDOCAINE HCL (PF) 1 % IJ SOLN
INTRAMUSCULAR | Status: AC
  Administered 2016-04-09: 0.03 mL via INTRADERMAL
  Filled 2016-04-09: qty 2

## 2016-04-09 MED ORDER — ONDANSETRON HCL 4 MG/2ML IJ SOLN
INTRAMUSCULAR | Status: DC | PRN
  Administered 2016-04-09: 4 mg via INTRAVENOUS

## 2016-04-09 MED ORDER — INDOMETHACIN 50 MG RE SUPP
100.0000 mg | Freq: Once | RECTAL | Status: AC
  Administered 2016-04-09: 100 mg via RECTAL
  Filled 2016-04-09: qty 2

## 2016-04-09 MED ORDER — PHENYLEPHRINE HCL 10 MG/ML IJ SOLN
INTRAMUSCULAR | Status: DC | PRN
  Administered 2016-04-09: 200 ug via INTRAVENOUS
  Administered 2016-04-09 (×2): 100 ug via INTRAVENOUS
  Administered 2016-04-09: 200 ug via INTRAVENOUS

## 2016-04-09 MED ORDER — FENTANYL CITRATE (PF) 100 MCG/2ML IJ SOLN
INTRAMUSCULAR | Status: DC | PRN
  Administered 2016-04-09 (×2): 50 ug via INTRAVENOUS

## 2016-04-09 MED ORDER — SUCCINYLCHOLINE CHLORIDE 20 MG/ML IJ SOLN
INTRAMUSCULAR | Status: DC | PRN
  Administered 2016-04-09: 100 mg via INTRAVENOUS

## 2016-04-09 MED ORDER — CEFAZOLIN SODIUM 1-5 GM-% IV SOLN
1.0000 g | Freq: Once | INTRAVENOUS | Status: AC
  Administered 2016-04-09: 1 g via INTRAVENOUS
  Filled 2016-04-09: qty 50

## 2016-04-09 MED ORDER — SODIUM CHLORIDE 0.9 % IV SOLN
INTRAVENOUS | Status: DC
  Administered 2016-04-09: 1000 mL via INTRAVENOUS

## 2016-04-09 NOTE — Anesthesia Postprocedure Evaluation (Signed)
Anesthesia Post Note  Patient: Rodney Day  Procedure(s) Performed: Procedure(s) (LRB): ENDOSCOPIC RETROGRADE CHOLANGIOPANCREATOGRAPHY (ERCP) (N/A)  Patient location during evaluation: Endoscopy Anesthesia Type: General Level of consciousness: awake and alert Pain management: pain level controlled Vital Signs Assessment: post-procedure vital signs reviewed and stable Respiratory status: spontaneous breathing, nonlabored ventilation, respiratory function stable and patient connected to nasal cannula oxygen Cardiovascular status: blood pressure returned to baseline and stable Postop Assessment: no signs of nausea or vomiting Anesthetic complications: no    Last Vitals:  Filed Vitals:   04/09/16 1419 04/09/16 1425  BP: 143/85 148/81  Pulse: 70 71  Temp:  36 C  Resp: 23 20    Last Pain:  Filed Vitals:   04/09/16 1426  PainSc: [place]

## 2016-04-09 NOTE — Transfer of Care (Signed)
Immediate Anesthesia Transfer of Care Note  Patient: Rodney Day  Procedure(s) Performed: Procedure(s): ENDOSCOPIC RETROGRADE CHOLANGIOPANCREATOGRAPHY (ERCP) (N/A)  Patient Location: PACU  Anesthesia Type:General  Level of Consciousness: awake, alert  and oriented  Airway & Oxygen Therapy: Patient Spontanous Breathing and Patient connected to face mask oxygen  Post-op Assessment: Report given to RN and Post -op Vital signs reviewed and stable  Post vital signs: Reviewed and stable  Last Vitals:  Filed Vitals:   04/09/16 1225  BP: 136/79  Pulse: 97  Temp: 36.4 C  Resp: 16    Last Pain:  Filed Vitals:   04/09/16 1226  PainSc: 4          Complications: No apparent anesthesia complications

## 2016-04-09 NOTE — Op Note (Signed)
[facility] Gastroenterology Patient Name: Rodney Day Procedure Date: 04/09/2016 12:50 PM MRN: [id number] Account #: [id number] Date of Birth: May 26, 1946 Admit Type: Outpatient Age: 70 Room: [facility] ENDO ROOM 4 Gender: Male Note Status: Finalized Procedure:            ERCP Indications:          Abdominal pain of suspected biliary origin, Abdominal                        pain in the right upper quadrant, Abnormal MRCP with                        stones in CBD, GB abscess on Abx Providers:            Lupita Dawn. Candace Cruise, MD Referring MD:         Kathlene November. Grayland Ormond, MD (Referring MD) Medicines:            General Anesthesia Procedure:            Pre-Anesthesia Assessment:                       - Prior to the procedure, a History and Physical was                        performed, and patient medications, allergies and                        sensitivities were reviewed. The patient's tolerance of                        previous anesthesia was reviewed.                       - The risks and benefits of the procedure and the                        sedation options and risks were discussed with the                        patient. All questions were answered and informed                        consent was obtained.                       - After reviewing the risks and benefits, the patient                        was deemed in satisfactory condition to undergo the                        procedure.                       After obtaining informed consent, the scope was passed                        under direct vision. Throughout the procedure, the                        patient's blood pressure, pulse,  and oxygen saturations                        were monitored continuously. The Endosonoscope was                        introduced through the mouth, and used to inject                        contrast into and used to inject contrast into the bile                        duct. The ERCP  was accomplished without difficulty. The                        patient tolerated the procedure well. Findings:      The scout film was normal. The esophagus was successfully intubated       under direct vision. The scope was advanced to a normal major papilla in       the descending duodenum without detailed examination of the pharynx,       larynx and associated structures, and upper GI tract. The upper GI tract       was grossly normal. Large periampullary diverticulum seen. The bile duct       was deeply cannulated with the short-nosed traction sphincterotome.       Contrast was injected. I personally interpreted the bile duct images.       Ductal flow of contrast was adequate. Image quality was adequate.       Contrast extended to the entire biliary tree. The intra-hepatic and       extra-hepatic biliary duct system was normal. A straight Roadrunner wire       was passed into the biliary tree. Biliary sphincterotomy was made with a       monofilament traction (standard) sphincterotome using ERBE       electrocautery. There was no post-sphincterotomy bleeding. The biliary       tree was swept with a 13.5 mm balloon starting at the bifurcation.       Nothing was found. Impression:           - The cholangiogram was normal.                       - A biliary sphincterotomy was performed.                       - The biliary tree was swept and nothing was found.                       - CBD stones may have passed on own. Recommendation:       - Discharge patient to home.                       - Observe patient's clinical course.                       - Continue present medications.                       - Finish Abx course for abscess. Surgery to determine  timing of GB surgery.                       - The findings and recommendations were discussed with                        the patient's family. Procedure Code(s):    --- Professional ---                        [phone], Endoscopic retrograde cholangiopancreatography                        (ERCP); with sphincterotomy/papillotomy Diagnosis Code(s):    --- Professional ---                       R10.11, Right upper quadrant pain                       R93.2, Abnormal findings on diagnostic imaging of liver                        and biliary tract CPT copyright 2016 American Medical Association. All rights reserved. The codes documented in this report are preliminary and upon coder review may  be revised to meet current compliance requirements. Hulen Luster, MD 04/09/2016 1:46:18 PM This report has been signed electronically. Number of Addenda: 0 Note Initiated On: 04/09/2016 12:50 PM      [facility]

## 2016-04-09 NOTE — Anesthesia Preprocedure Evaluation (Addendum)
Anesthesia Evaluation  Patient identified by MRN, date of birth, ID band Patient awake    Reviewed: Allergy & Precautions, NPO status , Patient's Chart, lab work & pertinent test results, reviewed documented beta blocker date and time   Airway Mallampati: II  TM Distance: >3 FB     Dental  (+) Chipped, Missing, Dental Advisory Given   Pulmonary former smoker,           Cardiovascular      Neuro/Psych    GI/Hepatic   Endo/Other    Renal/GU      Musculoskeletal   Abdominal   Peds  Hematology   Anesthesia Other Findings Bladder CA, with cyst and ileal conduit. Hx of PVCs. Anemic hb 11.0. GB abscess on antibiotics. Poor dentition.  Reproductive/Obstetrics                            Anesthesia Physical Anesthesia Plan  ASA: III  Anesthesia Plan: General   Post-op Pain Management:    Induction: Intravenous  Airway Management Planned: Oral ETT  Additional Equipment:   Intra-op Plan:   Post-operative Plan:   Informed Consent: I have reviewed the patients History and Physical, chart, labs and discussed the procedure including the risks, benefits and alternatives for the proposed anesthesia with the patient or authorized representative who has indicated his/her understanding and acceptance.     Plan Discussed with: CRNA  Anesthesia Plan Comments:        Anesthesia Quick Evaluation

## 2016-04-09 NOTE — OR Nursing (Signed)
Patient was intubated by anesthesia for procedure.

## 2016-04-09 NOTE — Anesthesia Procedure Notes (Addendum)
Procedure Name: Intubation Performed by: Demetrius Charity Pre-anesthesia Checklist: Patient identified, Patient being monitored, Timeout performed, Emergency Drugs available and Suction available Patient Re-evaluated:Patient Re-evaluated prior to inductionOxygen Delivery Method: Circle system utilized Preoxygenation: Pre-oxygenation with 100% oxygen Intubation Type: IV induction, Rapid sequence and Cricoid Pressure applied Ventilation: Mask ventilation without difficulty Laryngoscope Size: Mac and 3 Grade View: Grade I Tube type: Oral Tube size: 7.0 mm Number of attempts: 1 Airway Equipment and Method: Stylet Placement Confirmation: ETT inserted through vocal cords under direct vision,  positive ETCO2 and breath sounds checked- equal and bilateral Secured at: 21 cm Tube secured with: Tape Dental Injury: Teeth and Oropharynx as per pre-operative assessment

## 2016-04-09 NOTE — H&P (Signed)
    Primary Care Physician:  Lloyd Huger, MD Primary Gastroenterologist:  Dr. Candace Cruise  Pre-Procedure History & Physical: HPI:  Rodney Day is a 70 y.o. male is here for an ERCP. Being treated for GB abscess. Still with RUQ pain, decreased appetite, and wt loss.   Past Medical History  Diagnosis Date  . Malignant neoplasm of bladder [name])     Past Surgical History  Procedure Laterality Date  . Transurethral resection of bladder tumor    . Pr cystectomy,ileal conduit/sigmoid bladder  05/13/2013    CYSTECTOMY, COMPLETE, WITH URETEROILEAL CONDUIT OR SIGMOID BLADDER, INCLUDING INTESTINE ANASTOMOSIS; Surgeon: Ronnald Nian, MD  . Pr remove pelvis lymph nodes  05/13/2013    Procedure: PELVIC LYMPHADENECTOMY W/EXT ILIAC (SEPART PROC); Surgeon: Ronnald Nian, MD    Prior to Admission medications   Medication Sig Start Date End Date Taking? Authorizing Provider  amoxicillin-clavulanate (AUGMENTIN) 875-125 MG tablet Take 1 tablet by mouth every 12 (twelve) hours. 03/31/16   Clayburn Pert, MD  Aspirin-Salicylamide-Caffeine [facility] HEADACHE POWDER PO) Take 1 packet by mouth daily as needed (for arthritis and joint pain.).     Historical Provider, MD  oxyCODONE-acetaminophen (PERCOCET/ROXICET) 5-325 MG tablet Take 2 tablets by mouth every 4 (four) hours as needed for moderate pain or severe pain. 03/31/16   Clayburn Pert, MD    Allergies as of 04/07/2016  . (No Known Allergies)    Family History  Problem Relation Age of Onset  . Bladder Cancer Father   . Prostate cancer Father   . Stroke Paternal Uncle     Social History   Social History  . Marital Status: Married    Spouse Name: N/A  . Number of Children: N/A  . Years of Education: N/A   Occupational History  . Not on file.   Social History Main Topics  . Smoking status: Former Smoker -- 0.25 packs/day for 51 years    Types: Cigarettes    Quit date: 07/25/2013  . Smokeless tobacco: Never Used  . Alcohol Use: 0.0  oz/week    0 Standard drinks or equivalent per week     Comment: 1 beer every 2 weeks  . Drug Use: No  . Sexual Activity: Not on file   Other Topics Concern  . Not on file   Social History Narrative    Review of Systems: See HPI, otherwise negative ROS  Physical Exam: BP 136/79 mmHg  Pulse 97  Temp(Src) 97.5 F (36.4 C) (Tympanic)  Resp 16  Ht 5\' 11"  (1.803 m)  Wt 195 lb (88.451 kg)  BMI 27.21 kg/m2  SpO2 97% General:   Alert,  pleasant and cooperative in NAD Head:  Normocephalic and atraumatic. Neck:  Supple; no masses or thyromegaly. Lungs:  Clear throughout to auscultation.    Heart:  Regular rate and rhythm. Abdomen:  Soft, RUQ abdominal tenderness, nondistended. Normal bowel sounds, without guarding, and without rebound.   Neurologic:  Alert and  oriented x4;  grossly normal neurologically.  Impression/Plan: Rodney Day is here for an ERCP to be performed for CBD stones  Risks, benefits, limitations, and alternatives regarding  ERCP have been reviewed with the patient.  Questions have been answered.  All parties agreeable.   Naelle Diegel, Lupita Dawn, MD  04/09/2016, 1:10 PM

## 2016-04-10 ENCOUNTER — Encounter: Payer: Self-pay | Admitting: Gastroenterology

## 2016-04-16 ENCOUNTER — Encounter: Payer: Self-pay | Admitting: Surgery

## 2016-04-16 ENCOUNTER — Ambulatory Visit (INDEPENDENT_AMBULATORY_CARE_PROVIDER_SITE_OTHER): Payer: Medicare Other | Admitting: Surgery

## 2016-04-16 VITALS — BP 138/70 | HR 93 | Temp 97.8°F | Ht 71.0 in | Wt 186.0 lb

## 2016-04-16 DIAGNOSIS — K8067 Calculus of gallbladder and bile duct with acute and chronic cholecystitis with obstruction: Secondary | ICD-10-CM

## 2016-04-16 NOTE — Patient Instructions (Signed)
We will see you back in the office in 2 months, July. Please call our office if you have questions or concerns.

## 2016-04-16 NOTE — Progress Notes (Signed)
Subjective:     Patient ID: Rodney Day, male   DOB: 01-17-1946, 70 y.o.   MRN: [id number]  HPI 70 yr old male with A history of bladder cancer who recently was seen in emergency department for acute cholecystitis with abscess formation and choledocholithiasis. Patient had no signs of obstruction per his LFTs. Patient improved while in the hospital just on antibiotics alone and did not require drainage or operation at that time. Patient has stopped his Augmentin. Patient states that his appetite and energy has still been down but they seem to be slowly improving. Patient states mentally had his ERCP with Dr. Candace Cruise on April 26 that he continue to have intermittent pain. Patient states that since this time he has not had any right upper quadrant pain. Patient denies any fever chills nausea vomiting diarrhea or constipation.  Filed Vitals:   04/16/16 1523  BP: 138/70  Pulse: 93  Temp: 97.8 F (36.6 C)     Review of Systems  Constitutional: Positive for activity change, appetite change and fatigue. Negative for fever, chills, diaphoresis and unexpected weight change.  HENT: Negative for congestion and sore throat.   Respiratory: Negative for cough, chest tightness, shortness of breath and wheezing.   Cardiovascular: Negative for chest pain, palpitations and leg swelling.  Gastrointestinal: Negative for nausea, vomiting, abdominal pain, diarrhea, constipation and abdominal distention.  Genitourinary: Negative for dysuria and hematuria.  Musculoskeletal: Negative for back pain.  Skin: Negative for color change, pallor, rash and wound.  Neurological: Negative for dizziness and weakness.  Hematological: Negative for adenopathy. Does not bruise/bleed easily.  Psychiatric/Behavioral: The patient is not nervous/anxious.        Objective:   Physical Exam  Constitutional: He is oriented to person, place, and time. He appears well-developed and well-nourished. No distress.  HENT:  Head:  Normocephalic and atraumatic.  Right Ear: External ear normal.  Left Ear: External ear normal.  Nose: Nose normal.  Mouth/Throat: Oropharynx is clear and moist. No oropharyngeal exudate.  Eyes: Conjunctivae and EOM are normal. Pupils are equal, round, and reactive to light. No scleral icterus.  Neck: Normal range of motion. No tracheal deviation present.  Cardiovascular: Normal rate, regular rhythm, normal heart sounds and intact distal pulses.   Pulmonary/Chest: Effort normal and breath sounds normal. No respiratory distress. He has no wheezes. He has no rales.  Abdominal: Soft. Bowel sounds are normal. He exhibits no distension. There is no tenderness. There is no rebound and no guarding.  urosotomy pink patent, reducible hernia at site, non-tender  Musculoskeletal: Normal range of motion. He exhibits no edema or tenderness.  Neurological: He is alert and oriented to person, place, and time. No cranial nerve deficit.  Skin: Skin is warm and dry. No rash noted. No erythema. No pallor.  Psychiatric: He has a normal mood and affect. His behavior is normal. Judgment and thought content normal.  Vitals reviewed.      Assessment:     70 yr old with acute cholecystitis with abscess and choledocholithiasis     Plan:     Patient seems to be improving now and is not having any pain. Patient is still having some decreased appetite and some fatigue which is slowly improving. I assured them that this is normal for what he has. Patient was also informed that given the inflammation and abscess formation that we will wait 8 weeks before considering removal of his gallbladder. As discussed with him again that would likely be an open procedure given  the amount of inflammation and how close it is to his urostomy which also has a hernia at that site. Patient understands this and would like to avoid surgery if at all possible. I'll have him return in 8 weeks and see how he feels. Patient also understands that  if he gets a fever chills intractable nausea or vomiting right upper quadrant pain again or jaundice that he needs to call our office or come to the emergency room

## 2016-05-21 ENCOUNTER — Telehealth: Payer: Self-pay

## 2016-05-21 NOTE — Telephone Encounter (Signed)
Patient called and is scheduled for 06/23/16 for follow up with Dr.Loflin at 11:00.

## 2016-05-21 NOTE — Telephone Encounter (Signed)
LVM for patient to call office to schedule July appointment to discuss cholecystectomy with Dr. Adonis Huguenin.   Unable to book July appointment while in office due to templates not ready.

## 2016-05-23 ENCOUNTER — Inpatient Hospital Stay: Payer: Medicare Other | Attending: Oncology

## 2016-05-23 ENCOUNTER — Inpatient Hospital Stay (HOSPITAL_BASED_OUTPATIENT_CLINIC_OR_DEPARTMENT_OTHER): Payer: Medicare Other | Admitting: Oncology

## 2016-05-23 VITALS — BP 116/68 | HR 64 | Temp 96.5°F | Ht 71.0 in | Wt 184.4 lb

## 2016-05-23 DIAGNOSIS — K819 Cholecystitis, unspecified: Secondary | ICD-10-CM | POA: Diagnosis not present

## 2016-05-23 DIAGNOSIS — R110 Nausea: Secondary | ICD-10-CM | POA: Diagnosis not present

## 2016-05-23 DIAGNOSIS — Z8052 Family history of malignant neoplasm of bladder: Secondary | ICD-10-CM

## 2016-05-23 DIAGNOSIS — Z808 Family history of malignant neoplasm of other organs or systems: Secondary | ICD-10-CM

## 2016-05-23 DIAGNOSIS — R531 Weakness: Secondary | ICD-10-CM

## 2016-05-23 DIAGNOSIS — C679 Malignant neoplasm of bladder, unspecified: Secondary | ICD-10-CM

## 2016-05-23 DIAGNOSIS — Z8042 Family history of malignant neoplasm of prostate: Secondary | ICD-10-CM

## 2016-05-23 DIAGNOSIS — N2889 Other specified disorders of kidney and ureter: Secondary | ICD-10-CM | POA: Insufficient documentation

## 2016-05-23 DIAGNOSIS — R109 Unspecified abdominal pain: Secondary | ICD-10-CM | POA: Diagnosis not present

## 2016-05-23 DIAGNOSIS — Z87891 Personal history of nicotine dependence: Secondary | ICD-10-CM | POA: Diagnosis not present

## 2016-05-23 DIAGNOSIS — R630 Anorexia: Secondary | ICD-10-CM

## 2016-05-23 DIAGNOSIS — Z8551 Personal history of malignant neoplasm of bladder: Secondary | ICD-10-CM | POA: Diagnosis not present

## 2016-05-23 DIAGNOSIS — Z9221 Personal history of antineoplastic chemotherapy: Secondary | ICD-10-CM | POA: Insufficient documentation

## 2016-05-23 DIAGNOSIS — R5383 Other fatigue: Secondary | ICD-10-CM | POA: Insufficient documentation

## 2016-05-23 DIAGNOSIS — Z23 Encounter for immunization: Secondary | ICD-10-CM

## 2016-05-23 LAB — CBC WITH DIFFERENTIAL/PLATELET
Basophils Absolute: 0.2 10*3/uL — ABNORMAL HIGH (ref 0–0.1)
Basophils Relative: 2 %
EOS ABS: 0.2 10*3/uL (ref 0–0.7)
HCT: 38.9 % — ABNORMAL LOW (ref 40.0–52.0)
Hemoglobin: 12.8 g/dL — ABNORMAL LOW (ref 13.0–18.0)
LYMPHS ABS: 2 10*3/uL (ref 1.0–3.6)
MCH: 28.7 pg (ref 26.0–34.0)
MCHC: 33 g/dL (ref 32.0–36.0)
MCV: 87.1 fL (ref 80.0–100.0)
Monocytes Absolute: 0.4 10*3/uL (ref 0.2–1.0)
Neutro Abs: 5.2 10*3/uL (ref 1.4–6.5)
Neutrophils Relative %: 65 %
PLATELETS: 378 10*3/uL (ref 150–440)
RBC: 4.47 MIL/uL (ref 4.40–5.90)
RDW: 16.8 % — ABNORMAL HIGH (ref 11.5–14.5)
WBC: 8 10*3/uL (ref 3.8–10.6)

## 2016-05-23 LAB — BASIC METABOLIC PANEL
Anion gap: 7 (ref 5–15)
BUN: 19 mg/dL (ref 6–20)
CHLORIDE: 103 mmol/L (ref 101–111)
CO2: 26 mmol/L (ref 22–32)
CREATININE: 1.54 mg/dL — AB (ref 0.61–1.24)
Calcium: 9.4 mg/dL (ref 8.9–10.3)
GFR calc Af Amer: 51 mL/min — ABNORMAL LOW (ref >60)
GFR, EST NON AFRICAN AMERICAN: 44 mL/min — AB (ref >60)
GLUCOSE: 118 mg/dL — AB (ref 65–99)
POTASSIUM: 5.1 mmol/L (ref 3.5–5.1)
SODIUM: 136 mmol/L (ref 135–145)

## 2016-05-23 NOTE — Progress Notes (Signed)
Patient here for follow up. Patient was recently hospitalized for gallstones.

## 2016-06-01 NOTE — Progress Notes (Signed)
[place]  Telephone:([phone]) [phone] [phone]) [phone]  ID: Rodney Day OB: 16-Jul-1946  MR#: [id number]  [id number]  Patient Care Team: Lloyd Huger, MD as PCP - General (Oncology)  CHIEF COMPLAINT:  Chief Complaint  Patient presents with  . Bladder Cancer  . Follow-up    INTERVAL HISTORY: Patient returns to clinic today for repeat laboratory work and further evaluation. He recently has had some problems with cholecystitis and gallstones, but has not had a cholecystectomy. He has a poor appetite secondary to this. He also has occasional nausea. He denies any recent fevers.  He has no neurologic complaints.  He denies any chest pain or shortness of breath.  He denies any vomiting, constipation, or diarrhea.  Patient offers no further specific complaints today.   REVIEW OF SYSTEMS:   Review of Systems  Constitutional: Positive for weight loss and malaise/fatigue. Negative for fever.  HENT: Negative for congestion.   Respiratory: Negative.  Negative for shortness of breath.   Cardiovascular: Negative.  Negative for chest pain.  Gastrointestinal: Positive for nausea and abdominal pain.  Genitourinary: Negative.   Musculoskeletal: Negative.   Neurological: Positive for weakness.  Psychiatric/Behavioral: The patient is nervous/anxious.     As per HPI. Otherwise, a complete review of systems is negatve.  PAST MEDICAL HISTORY: Past Medical History  Diagnosis Date  . Malignant neoplasm of bladder ([place])     PAST SURGICAL HISTORY: Total cystectomy.  FAMILY HISTORY: Father with bladder and prostate cancer, grandmother with thyroid cancer.     ADVANCED DIRECTIVES:    HEALTH MAINTENANCE: Social History  Substance Use Topics  . Smoking status: Former Smoker -- 0.25 packs/day for 51 years    Types: Cigarettes    Quit date: 07/25/2013  . Smokeless tobacco: Never Used  . Alcohol Use: 0.0 oz/week    0 Standard drinks or equivalent per week     Comment: 1 beer every 2 weeks     Colonoscopy:  PAP:  Bone density:  Lipid panel:  No Known Allergies  Current Outpatient Prescriptions  Medication Sig Dispense Refill  . Aspirin-Salicylamide-Caffeine (BC HEADACHE POWDER PO) Take 1 packet by mouth daily as needed (for arthritis and joint pain.).      No current facility-administered medications for this visit.    OBJECTIVE: Filed Vitals:   05/23/16 1016  BP: 116/68  Pulse: 64  Temp: 96.5 F (35.8 C)     Body mass index is 25.73 kg/(m^2).    ECOG FS:0 - Asymptomatic  General: Well-developed, well-nourished, no acute distress. Eyes: anicteric sclera. Lungs: Clear to auscultation bilaterally. Heart: Regular rate and rhythm. No rubs, murmurs, or gallops. Abdomen: Soft, nontender, nondistended. No organomegaly noted, normoactive bowel sounds. Urostomy tubes noted. Musculoskeletal: No edema, cyanosis, or clubbing. Neuro: Alert, answering all questions appropriately. Cranial nerves grossly intact. Skin: No rashes or petechiae noted. Psych: Normal affect.  LAB RESULTS:  Lab Results  Component Value Date   NA 136 05/23/2016   K 5.1 05/23/2016   CL 103 05/23/2016   CO2 26 05/23/2016   GLUCOSE 118* 05/23/2016   BUN 19 05/23/2016   CREATININE 1.54* 05/23/2016   CALCIUM 9.4 05/23/2016   PROT 5.9* 03/31/2016   ALBUMIN 2.6* 03/31/2016   AST 11* 03/31/2016   ALT 8* 03/31/2016   ALKPHOS 59 03/31/2016   BILITOT 0.6 03/31/2016   GFRNONAA 44* 05/23/2016   GFRAA 51* 05/23/2016    Lab Results  Component Value Date   WBC 8.0 05/23/2016   NEUTROABS 5.2  05/23/2016   HGB 12.8* 05/23/2016   HCT 38.9* 05/23/2016   MCV 87.1 05/23/2016   PLT 378 05/23/2016     STUDIES: No results found.  ASSESSMENT: Pathologic stage III transitional cell carcinoma, status post radical cystectomy.  PLAN:    1.  Bladder cancer:  Patient completed neoadjuvant chemotherapy followed by total cystectomy in May 2014. CT scan results from May  2016 did not reveal any evidence of recurrence. MRI the abdomen on March 29, 2016 reviewed independently also with no evidence of recurrence. There is no need to do any further imaging unless there is suspicion of recurrence. Return to clinic in 6 months with repeat laboratory work and further evaluation. 2.  Renal insufficiency: Creatinine is approximately at his baseline. Monitor.  3.  Cholecystitis: Continue treatment and evaluation by surgery.  Patient expressed understanding and was in agreement with this plan. He also understands that He can call clinic at any time with any questions, concerns, or complaints.   Bladder cancer   Staging form: Urinary Bladder, AJCC 7th Edition     Clinical stage from 05/25/2015: Stage III (T3, N0, M0) - Signed by Lloyd Huger, MD on 05/25/2015   Lloyd Huger, MD   06/01/2016 8:20 AM

## 2016-06-19 ENCOUNTER — Telehealth: Payer: Self-pay

## 2016-06-19 NOTE — Telephone Encounter (Signed)
I called patient at this time to schedule follow up appointment with Dr.Loflin in July to discuss gallbladder removal.  Patients states at this time he is feeling fine and is not interested in having surgery. He stated he would call if he started having symptoms again.

## 2016-06-23 ENCOUNTER — Ambulatory Visit: Payer: Medicare Other | Admitting: Surgery

## 2016-11-26 NOTE — Progress Notes (Signed)
[place]  [phone] [phone]  ID: Rodney Day OB: [birth year]  MR#: [id number]  [id number]  Patient Care Team: Lloyd Huger, MD as PCP - General (Oncology)  CHIEF COMPLAINT:  Chief Complaint  Patient presents with  . Bladder Cancer    INTERVAL HISTORY: Patient returns to clinic today for repeat laboratory work and further evaluation. He recently has had some problems with cholecystitis and gallstones, but has not had a cholecystectomy. His appetite has improved. He denies ausea. He denies any recent fevers.  He has no neurologic complaints.  He denies any chest pain or shortness of breath.  He denies any vomiting, constipation, or diarrhea.  Patient offers no further specific complaints today.   REVIEW OF SYSTEMS:   Review of Systems  Constitutional: Positive for malaise/fatigue and weight loss. Negative for fever.  HENT: Negative for congestion.   Respiratory: Negative.  Negative for shortness of breath.   Cardiovascular: Negative.  Negative for chest pain.  Gastrointestinal: Positive for abdominal pain and nausea.  Genitourinary: Negative.   Musculoskeletal: Negative.   Neurological: Positive for weakness.  Psychiatric/Behavioral: The patient is nervous/anxious.     As per HPI. Otherwise, a complete review of systems is negatve.  PAST MEDICAL HISTORY: Past Medical History:  Diagnosis Date  . Malignant neoplasm of bladder ([place])     PAST SURGICAL HISTORY: Total cystectomy.  FAMILY HISTORY: Father with bladder and prostate cancer, grandmother with thyroid cancer.     ADVANCED DIRECTIVES:    HEALTH MAINTENANCE: Social History  Substance Use Topics  . Smoking status: Former Smoker    Packs/day: 0.25    Years: 51.00    Types: Cigarettes    Quit date: 07/25/2013  . Smokeless tobacco: Never Used  . Alcohol use 0.0 oz/week     Comment: 1 beer every 2 weeks     Colonoscopy:  PAP:  Bone density:  Lipid  panel:  No Known Allergies  Current Outpatient Prescriptions  Medication Sig Dispense Refill  . ibuprofen (ADVIL,MOTRIN) 200 MG tablet Take 200 mg by mouth every 8 (eight) hours as needed.     No current facility-administered medications for this visit.     OBJECTIVE: Vitals:   11/28/16 0950  BP: 138/83  Pulse: 85  Resp: 18  Temp: (!) 95.6 F (35.3 C)     Body mass index is 27.86 kg/m.    ECOG FS:0 - Asymptomatic  General: Well-developed, well-nourished, no acute distress. Eyes: anicteric sclera. Lungs: Clear to auscultation bilaterally. Heart: Regular rate and rhythm. No rubs, murmurs, or gallops. Abdomen: Soft, nontender, nondistended. No organomegaly noted, normoactive bowel sounds. Urostomy tubes noted. Musculoskeletal: No edema, cyanosis, or clubbing. Neuro: Alert, answering all questions appropriately. Cranial nerves grossly intact. Skin: No rashes or petechiae noted. Psych: Normal affect.  LAB RESULTS:  Lab Results  Component Value Date   NA 133 (L) 11/28/2016   K 4.3 11/28/2016   CL 100 (L) 11/28/2016   CO2 26 11/28/2016   GLUCOSE 134 (H) 11/28/2016   BUN 20 11/28/2016   CREATININE 1.66 (H) 11/28/2016   CALCIUM 9.4 11/28/2016   PROT 7.2 11/28/2016   ALBUMIN 4.1 11/28/2016   AST 18 11/28/2016   ALT 16 (L) 11/28/2016   ALKPHOS 73 11/28/2016   BILITOT 0.6 11/28/2016   GFRNONAA 40 (L) 11/28/2016   GFRAA 47 (L) 11/28/2016    Lab Results  Component Value Date   WBC 6.0 11/28/2016   NEUTROABS 3.6 11/28/2016   HGB 14.0 11/28/2016  HCT 41.6 11/28/2016   MCV 90.7 11/28/2016   PLT 274 11/28/2016     STUDIES: No results found.  ASSESSMENT: Pathologic stage III transitional cell carcinoma, status post radical cystectomy.  PLAN:    1.  Bladder cancer:  Patient completed neoadjuvant chemotherapy followed by total cystectomy in May 2014. CT scan results from May 2016 did not reveal any evidence of recurrence. MRI the abdomen on March 29, 2016 review  with no evidence of recurrence. There is no need to do any further imaging unless there is suspicion of recurrence. Return to clinic in 6 months with repeat labs and in one year for labs and further evaluation.  2.  Renal insufficiency: Creatinine is at his baseline. Monitor.  3.  Cholecystitis: Followed by surgery.  Patient expressed understanding and was in agreement with this plan. He also understands that He can call clinic at any time with any questions, concerns, or complaints.   Bladder cancer   Staging form: Urinary Bladder, AJCC 7th Edition     Clinical stage from 05/25/2015: Stage III (T3, N0, M0) - Signed by Lloyd Huger, MD on 05/25/2015  Faythe Casa, NP 11/28/2016 12:15PM  Patient was seen and evaluated independently and I agree with the assessment and plan as dictated above.  Lloyd Huger, MD 11/30/16 10:12 AM

## 2016-11-28 ENCOUNTER — Inpatient Hospital Stay (HOSPITAL_BASED_OUTPATIENT_CLINIC_OR_DEPARTMENT_OTHER): Payer: Medicare Other | Admitting: Oncology

## 2016-11-28 ENCOUNTER — Inpatient Hospital Stay: Payer: Medicare Other | Attending: Oncology

## 2016-11-28 VITALS — BP 138/83 | HR 85 | Temp 95.6°F | Resp 18 | Wt 199.7 lb

## 2016-11-28 DIAGNOSIS — K8010 Calculus of gallbladder with chronic cholecystitis without obstruction: Secondary | ICD-10-CM

## 2016-11-28 DIAGNOSIS — Z808 Family history of malignant neoplasm of other organs or systems: Secondary | ICD-10-CM

## 2016-11-28 DIAGNOSIS — R110 Nausea: Secondary | ICD-10-CM

## 2016-11-28 DIAGNOSIS — Z87891 Personal history of nicotine dependence: Secondary | ICD-10-CM | POA: Insufficient documentation

## 2016-11-28 DIAGNOSIS — Z8551 Personal history of malignant neoplasm of bladder: Secondary | ICD-10-CM | POA: Insufficient documentation

## 2016-11-28 DIAGNOSIS — Z906 Acquired absence of other parts of urinary tract: Secondary | ICD-10-CM

## 2016-11-28 DIAGNOSIS — N2889 Other specified disorders of kidney and ureter: Secondary | ICD-10-CM | POA: Diagnosis not present

## 2016-11-28 DIAGNOSIS — R109 Unspecified abdominal pain: Secondary | ICD-10-CM

## 2016-11-28 DIAGNOSIS — Z8042 Family history of malignant neoplasm of prostate: Secondary | ICD-10-CM

## 2016-11-28 DIAGNOSIS — R531 Weakness: Secondary | ICD-10-CM | POA: Diagnosis not present

## 2016-11-28 DIAGNOSIS — C679 Malignant neoplasm of bladder, unspecified: Secondary | ICD-10-CM

## 2016-11-28 DIAGNOSIS — R5383 Other fatigue: Secondary | ICD-10-CM | POA: Insufficient documentation

## 2016-11-28 LAB — CBC WITH DIFFERENTIAL/PLATELET
BASOS ABS: 0.1 10*3/uL (ref 0–0.1)
BASOS PCT: 2 %
EOS PCT: 1 %
Eosinophils Absolute: 0.1 10*3/uL (ref 0–0.7)
HCT: 41.6 % (ref 40.0–52.0)
Hemoglobin: 14 g/dL (ref 13.0–18.0)
Lymphocytes Relative: 29 %
Lymphs Abs: 1.7 10*3/uL (ref 1.0–3.6)
MCH: 30.4 pg (ref 26.0–34.0)
MCHC: 33.5 g/dL (ref 32.0–36.0)
MCV: 90.7 fL (ref 80.0–100.0)
MONO ABS: 0.5 10*3/uL (ref 0.2–1.0)
Monocytes Relative: 9 %
NEUTROS ABS: 3.6 10*3/uL (ref 1.4–6.5)
Neutrophils Relative %: 59 %
PLATELETS: 274 10*3/uL (ref 150–440)
RBC: 4.59 MIL/uL (ref 4.40–5.90)
RDW: 14.4 % (ref 11.5–14.5)
WBC: 6 10*3/uL (ref 3.8–10.6)

## 2016-11-28 LAB — COMPREHENSIVE METABOLIC PANEL
ALT: 16 U/L — ABNORMAL LOW (ref 17–63)
AST: 18 U/L (ref 15–41)
Albumin: 4.1 g/dL (ref 3.5–5.0)
Alkaline Phosphatase: 73 U/L (ref 38–126)
Anion gap: 7 (ref 5–15)
BUN: 20 mg/dL (ref 6–20)
CO2: 26 mmol/L (ref 22–32)
Calcium: 9.4 mg/dL (ref 8.9–10.3)
Chloride: 100 mmol/L — ABNORMAL LOW (ref 101–111)
Creatinine, Ser: 1.66 mg/dL — ABNORMAL HIGH (ref 0.61–1.24)
GFR calc Af Amer: 47 mL/min — ABNORMAL LOW (ref >60)
GFR calc non Af Amer: 40 mL/min — ABNORMAL LOW (ref >60)
Glucose, Bld: 134 mg/dL — ABNORMAL HIGH (ref 65–99)
Potassium: 4.3 mmol/L (ref 3.5–5.1)
Sodium: 133 mmol/L — ABNORMAL LOW (ref 135–145)
Total Bilirubin: 0.6 mg/dL (ref 0.3–1.2)
Total Protein: 7.2 g/dL (ref 6.5–8.1)

## 2016-11-28 NOTE — Progress Notes (Signed)
Complains of usual pains from arthritis but is relieved by taking ibuprofen.

## 2017-01-16 ENCOUNTER — Telehealth: Payer: Self-pay | Admitting: *Deleted

## 2017-01-16 NOTE — Telephone Encounter (Signed)
-----   Message from Lloyd Huger, MD sent at 01/16/2017 12:26 PM EST ----- Contact: [phone] He hasn't had treatment in years.  I have no clinical basis for excusing him from jury duty.  Sorry.   ----- Message ----- From: Cephus Richer Sent: 01/16/2017  11:21 AM To: Lloyd Huger, MD, [facility], RN  Pt need letter dismissing him from jury duty. Please mail.

## 2017-01-16 NOTE — Telephone Encounter (Signed)
Pt has been made aware. 

## 2017-04-08 NOTE — Progress Notes (Signed)
[place]  [phone] [phone]  ID: Bella Kennedy OB: 11/15/1946  MR#: [id number]  POE#:[id number]  Patient Care Team: Lloyd Huger, MD as PCP - General (Oncology)  CHIEF COMPLAINT:  Chief Complaint  Patient presents with  . Malignant neoplasm of urinary bladder, unspecified site    INTERVAL HISTORY: Patient returns to clinic today for repeat laboratory work and further evaluation. He was in his usual state of health until 1-2 weeks ago when he became increasingly weak and fatigue and has a 8-10 pound unintentional weight loss. He denies any recent fevers.  He has no neurologic complaints.  He denies any chest pain or shortness of breath.  He denies any nausea, vomiting, constipation, or diarrhea.  Patient feels generally terrible, but offers no further specific complaints today.   REVIEW OF SYSTEMS:   Review of Systems  Constitutional: Positive for malaise/fatigue and weight loss. Negative for fever.  HENT: Negative for congestion.   Respiratory: Negative.  Negative for cough and shortness of breath.   Cardiovascular: Negative.  Negative for chest pain and leg swelling.  Gastrointestinal: Negative for abdominal pain and nausea.  Genitourinary: Negative.   Musculoskeletal: Negative.   Neurological: Positive for weakness. Negative for sensory change.  Psychiatric/Behavioral: The patient is nervous/anxious.     As per HPI. Otherwise, a complete review of systems is negatve.  PAST MEDICAL HISTORY: Past Medical History:  Diagnosis Date  . Malignant neoplasm of bladder ([place])     PAST SURGICAL HISTORY: Total cystectomy.  FAMILY HISTORY: Father with bladder and prostate cancer, grandmother with thyroid cancer.     ADVANCED DIRECTIVES:    HEALTH MAINTENANCE: Social History  Substance Use Topics  . Smoking status: Former Smoker    Packs/day: 0.25    Years: 51.00    Types: Cigarettes    Quit date: 07/25/2013  .  Smokeless tobacco: Never Used  . Alcohol use 0.0 oz/week     Comment: 1 beer every 2 weeks     Colonoscopy:  PAP:  Bone density:  Lipid panel:  No Known Allergies  Current Outpatient Prescriptions  Medication Sig Dispense Refill  . ibuprofen (ADVIL,MOTRIN) 200 MG tablet Take 200 mg by mouth every 8 (eight) hours as needed.     No current facility-administered medications for this visit.     OBJECTIVE: Vitals:   04/10/17 0956  BP: 138/81  Pulse: 65  Resp: 18  Temp: (!) 96.2 F (35.7 C)     Body mass index is 26.84 kg/m.    ECOG FS:0 - Asymptomatic  General: Well-developed, well-nourished, no acute distress. Eyes: anicteric sclera. Lungs: Clear to auscultation bilaterally. Heart: Regular rate and rhythm. No rubs, murmurs, or gallops. Abdomen: Soft, nontender, nondistended. No organomegaly noted, normoactive bowel sounds. Urostomy tubes noted. Musculoskeletal: No edema, cyanosis, or clubbing. Neuro: Alert, answering all questions appropriately. Cranial nerves grossly intact. Skin: No rashes or petechiae noted. Psych: Normal affect.  LAB RESULTS:  Lab Results  Component Value Date   NA 130 (L) 04/10/2017   K 4.4 04/10/2017   CL 98 (L) 04/10/2017   CO2 25 04/10/2017   GLUCOSE 110 (H) 04/10/2017   BUN 19 04/10/2017   CREATININE 1.68 (H) 04/10/2017   CALCIUM 9.6 04/10/2017   PROT 7.9 04/10/2017   ALBUMIN 4.4 04/10/2017   AST 18 04/10/2017   ALT 13 (L) 04/10/2017   ALKPHOS 62 04/10/2017   BILITOT 0.8 04/10/2017   GFRNONAA 40 (L) 04/10/2017   GFRAA 46 (L) 04/10/2017  Lab Results  Component Value Date   WBC 6.3 04/10/2017   NEUTROABS 4.3 04/10/2017   HGB 14.6 04/10/2017   HCT 42.7 04/10/2017   MCV 88.4 04/10/2017   PLT 296 04/10/2017     STUDIES: No results found.  ASSESSMENT: Pathologic stage III transitional cell carcinoma, status post radical cystectomy.  PLAN:    1.  Bladder cancer:  Patient completed neoadjuvant chemotherapy followed by  total cystectomy in May 2014. CT scan results from May 2016 did not reveal any evidence of recurrence. MRI the abdomen on March 29, 2016 also with no evidence of recurrence. Because of his new onset symptoms and unintentional weigh loss, will get a CT scan to further evaluate. Return to clinic in 1 week.  2.  Renal insufficiency: Creatinine is approximately at his baseline. Monitor.  3.  Cholecystitis: Followed by surgery. 4.  Weakness and fatigue:  CT as above. CBC, iron stores and thyroid panel are within normal limits.  Approximately 30 minutes was spent in discussion of which greater than 50% was consultation.  Patient expressed understanding and was in agreement with this plan. He also understands that He can call clinic at any time with any questions, concerns, or complaints.   Bladder cancer   Staging form: Urinary Bladder, AJCC 7th Edition     Clinical stage from 05/25/2015: Stage III (T3, N0, M0) - Signed by Lloyd Huger, MD on 05/25/2015   Lloyd Huger, MD 04/12/17 4:03 PM

## 2017-04-10 ENCOUNTER — Inpatient Hospital Stay: Payer: Medicare Other | Attending: Oncology | Admitting: Oncology

## 2017-04-10 ENCOUNTER — Inpatient Hospital Stay: Payer: Medicare Other

## 2017-04-10 VITALS — BP 138/81 | HR 65 | Temp 96.2°F | Resp 18 | Wt 192.5 lb

## 2017-04-10 DIAGNOSIS — Z906 Acquired absence of other parts of urinary tract: Secondary | ICD-10-CM | POA: Diagnosis not present

## 2017-04-10 DIAGNOSIS — R5383 Other fatigue: Secondary | ICD-10-CM | POA: Diagnosis not present

## 2017-04-10 DIAGNOSIS — K819 Cholecystitis, unspecified: Secondary | ICD-10-CM | POA: Diagnosis not present

## 2017-04-10 DIAGNOSIS — R531 Weakness: Secondary | ICD-10-CM | POA: Diagnosis not present

## 2017-04-10 DIAGNOSIS — Z8551 Personal history of malignant neoplasm of bladder: Secondary | ICD-10-CM

## 2017-04-10 DIAGNOSIS — R634 Abnormal weight loss: Secondary | ICD-10-CM

## 2017-04-10 DIAGNOSIS — N2889 Other specified disorders of kidney and ureter: Secondary | ICD-10-CM | POA: Diagnosis not present

## 2017-04-10 DIAGNOSIS — N289 Disorder of kidney and ureter, unspecified: Secondary | ICD-10-CM | POA: Insufficient documentation

## 2017-04-10 DIAGNOSIS — Z8052 Family history of malignant neoplasm of bladder: Secondary | ICD-10-CM | POA: Diagnosis not present

## 2017-04-10 DIAGNOSIS — C679 Malignant neoplasm of bladder, unspecified: Secondary | ICD-10-CM

## 2017-04-10 DIAGNOSIS — Z8041 Family history of malignant neoplasm of ovary: Secondary | ICD-10-CM | POA: Insufficient documentation

## 2017-04-10 DIAGNOSIS — Z87891 Personal history of nicotine dependence: Secondary | ICD-10-CM | POA: Diagnosis not present

## 2017-04-10 DIAGNOSIS — Z808 Family history of malignant neoplasm of other organs or systems: Secondary | ICD-10-CM | POA: Insufficient documentation

## 2017-04-10 LAB — CBC WITH DIFFERENTIAL/PLATELET
BASOS ABS: 0.1 10*3/uL (ref 0–0.1)
BASOS PCT: 1 %
Eosinophils Absolute: 0 10*3/uL (ref 0–0.7)
Eosinophils Relative: 1 %
HEMATOCRIT: 42.7 % (ref 40.0–52.0)
Hemoglobin: 14.6 g/dL (ref 13.0–18.0)
Lymphocytes Relative: 22 %
Lymphs Abs: 1.4 10*3/uL (ref 1.0–3.6)
MCH: 30.2 pg (ref 26.0–34.0)
MCHC: 34.2 g/dL (ref 32.0–36.0)
MCV: 88.4 fL (ref 80.0–100.0)
MONO ABS: 0.5 10*3/uL (ref 0.2–1.0)
Monocytes Relative: 7 %
NEUTROS ABS: 4.3 10*3/uL (ref 1.4–6.5)
Neutrophils Relative %: 69 %
Platelets: 296 10*3/uL (ref 150–440)
RBC: 4.83 MIL/uL (ref 4.40–5.90)
RDW: 14.6 % — AB (ref 11.5–14.5)
WBC: 6.3 10*3/uL (ref 3.8–10.6)

## 2017-04-10 LAB — COMPREHENSIVE METABOLIC PANEL
ALBUMIN: 4.4 g/dL (ref 3.5–5.0)
ALT: 13 U/L — ABNORMAL LOW (ref 17–63)
AST: 18 U/L (ref 15–41)
Alkaline Phosphatase: 62 U/L (ref 38–126)
Anion gap: 7 (ref 5–15)
BILIRUBIN TOTAL: 0.8 mg/dL (ref 0.3–1.2)
BUN: 19 mg/dL (ref 6–20)
CO2: 25 mmol/L (ref 22–32)
Calcium: 9.6 mg/dL (ref 8.9–10.3)
Chloride: 98 mmol/L — ABNORMAL LOW (ref 101–111)
Creatinine, Ser: 1.68 mg/dL — ABNORMAL HIGH (ref 0.61–1.24)
GFR calc Af Amer: 46 mL/min — ABNORMAL LOW (ref >60)
GFR calc non Af Amer: 40 mL/min — ABNORMAL LOW (ref >60)
GLUCOSE: 110 mg/dL — AB (ref 65–99)
POTASSIUM: 4.4 mmol/L (ref 3.5–5.1)
Sodium: 130 mmol/L — ABNORMAL LOW (ref 135–145)
TOTAL PROTEIN: 7.9 g/dL (ref 6.5–8.1)

## 2017-04-10 LAB — TSH: TSH: 0.89 u[IU]/mL (ref 0.350–4.500)

## 2017-04-10 LAB — IRON AND TIBC
Iron: 105 ug/dL (ref 45–182)
SATURATION RATIOS: 29 % (ref 17.9–39.5)
TIBC: 360 ug/dL (ref 250–450)
UIBC: 255 ug/dL

## 2017-04-10 LAB — FERRITIN: Ferritin: 82 ng/mL (ref 24–336)

## 2017-04-10 NOTE — Progress Notes (Signed)
Patient is here for follow up, he is tired all the time and has bad appetite.

## 2017-04-11 LAB — T4: T4, Total: 7.7 ug/dL (ref 4.5–12.0)

## 2017-04-14 ENCOUNTER — Telehealth: Payer: Self-pay | Admitting: *Deleted

## 2017-04-14 ENCOUNTER — Ambulatory Visit
Admission: RE | Admit: 2017-04-14 | Discharge: 2017-04-14 | Disposition: A | Discharge status: Home / Self Care | Payer: Medicare Other | Source: Ambulatory Visit | Attending: Oncology | Admitting: Oncology

## 2017-04-14 DIAGNOSIS — I700 Atherosclerosis of aorta: Secondary | ICD-10-CM | POA: Insufficient documentation

## 2017-04-14 DIAGNOSIS — I2510 Atherosclerotic heart disease of native coronary artery without angina pectoris: Secondary | ICD-10-CM | POA: Insufficient documentation

## 2017-04-14 DIAGNOSIS — K449 Diaphragmatic hernia without obstruction or gangrene: Secondary | ICD-10-CM | POA: Insufficient documentation

## 2017-04-14 DIAGNOSIS — K8020 Calculus of gallbladder without cholecystitis without obstruction: Secondary | ICD-10-CM | POA: Insufficient documentation

## 2017-04-14 DIAGNOSIS — K439 Ventral hernia without obstruction or gangrene: Secondary | ICD-10-CM | POA: Diagnosis not present

## 2017-04-14 DIAGNOSIS — N200 Calculus of kidney: Secondary | ICD-10-CM | POA: Diagnosis not present

## 2017-04-14 DIAGNOSIS — C679 Malignant neoplasm of bladder, unspecified: Secondary | ICD-10-CM | POA: Insufficient documentation

## 2017-04-14 DIAGNOSIS — J984 Other disorders of lung: Secondary | ICD-10-CM | POA: Diagnosis not present

## 2017-04-14 MED ORDER — IOPAMIDOL (ISOVUE-300) INJECTION 61%
75.0000 mL | Freq: Once | INTRAVENOUS | Status: AC | PRN
  Administered 2017-04-14: 75 mL via INTRAVENOUS

## 2017-04-14 NOTE — Telephone Encounter (Signed)
IMPRESSION: 1. Status post cysto prostatectomy, without recurrent or metastatic disease. No acute process in the abdomen or pelvis. 2. Left lower lobe partially cavitary mildly thick walled lesion is either new or changed in morphology compared to 2015. This is suspicious for primary bronchogenic carcinoma. Incompletely imaged. Consider dedicated chest CT. These results will be called to the ordering clinician or representative by the Radiologist Assistant, and communication documented in the PACS or zVision Dashboard. 3. Left nephrolithiasis. 4.  Possible constipation. 5. Cholelithiasis. 6. Parastomal and ventral abdominal wall hernias containing fat. 7.  Coronary artery atherosclerosis. Aortic atherosclerosis. 8. Tiny hiatal hernia. Esophageal fluid level suggests dysmotility or gastroesophageal reflux.   Electronically Signed   By: Abigail Miyamoto M.D.   On: 04/14/2017 14:17   Signed By:  Abigail Miyamoto, MD on 04/14/2017  2:17 PM

## 2017-04-15 NOTE — Progress Notes (Signed)
[place]  [phone] [phone]) [phone]  ID: Rodney Day OB: 26-Mar-1946  MR#: [id number]  MOQ#:[id number]  Patient Care Team: Lloyd Huger, MD as PCP - General (Oncology)  CHIEF COMPLAINT: Pathologic stage III transitional cell carcinoma, status post radical cystectomy. Left lower lobe lung mass.  INTERVAL HISTORY: Patient returns to clinic today for further evaluation and discussion of his imaging results. He continues to have increased weakness and fatigue. He has a poor appetite, but does not report any additional weight loss. He recently restarted smoking as well. He denies any recent fevers.  He has no neurologic complaints.  He denies any chest pain or shortness of breath.  He denies any nausea, vomiting, constipation, or diarrhea.  Patient feels generally terrible, but offers no further specific complaints today.   REVIEW OF SYSTEMS:   Review of Systems  Constitutional: Positive for malaise/fatigue and weight loss. Negative for fever.  HENT: Negative for congestion.   Respiratory: Negative.  Negative for cough and shortness of breath.   Cardiovascular: Negative.  Negative for chest pain and leg swelling.  Gastrointestinal: Negative for abdominal pain and nausea.  Genitourinary: Negative.   Musculoskeletal: Negative.   Neurological: Positive for weakness. Negative for sensory change.  Psychiatric/Behavioral: The patient is nervous/anxious.     As per HPI. Otherwise, a complete review of systems is negative.  PAST MEDICAL HISTORY: Past Medical History:  Diagnosis Date  . Malignant neoplasm of bladder ([place])     PAST SURGICAL HISTORY: Total cystectomy.  FAMILY HISTORY: Father with bladder and prostate cancer, grandmother with thyroid cancer.     ADVANCED DIRECTIVES:    HEALTH MAINTENANCE: Social History  Substance Use Topics  . Smoking status: Former Smoker    Packs/day: 0.25    Years: 51.00    Types: Cigarettes   Quit date: 07/25/2013  . Smokeless tobacco: Never Used  . Alcohol use 0.0 oz/week     Comment: 1 beer every 2 weeks     Colonoscopy:  PAP:  Bone density:  Lipid panel:  No Known Allergies  Current Outpatient Prescriptions  Medication Sig Dispense Refill  . ibuprofen (ADVIL,MOTRIN) 200 MG tablet Take 200 mg by mouth every 8 (eight) hours as needed.     No current facility-administered medications for this visit.     OBJECTIVE: Vitals:   04/16/17 1023  BP: 124/76  Pulse: 78  Temp: 97.7 F (36.5 C)     Body mass index is 25.85 kg/m.    ECOG FS:0 - Asymptomatic  General: Well-developed, well-nourished, no acute distress. Eyes: anicteric sclera. Lungs: Clear to auscultation bilaterally. Heart: Regular rate and rhythm. No rubs, murmurs, or gallops. Abdomen: Soft, nontender, nondistended. No organomegaly noted, normoactive bowel sounds. Urostomy tubes noted. Musculoskeletal: No edema, cyanosis, or clubbing. Neuro: Alert, answering all questions appropriately. Cranial nerves grossly intact. Skin: No rashes or petechiae noted. Psych: Normal affect.  LAB RESULTS:  Lab Results  Component Value Date   NA 130 (L) 04/10/2017   K 4.4 04/10/2017   CL 98 (L) 04/10/2017   CO2 25 04/10/2017   GLUCOSE 110 (H) 04/10/2017   BUN 19 04/10/2017   CREATININE 1.68 (H) 04/10/2017   CALCIUM 9.6 04/10/2017   PROT 7.9 04/10/2017   ALBUMIN 4.4 04/10/2017   AST 18 04/10/2017   ALT 13 (L) 04/10/2017   ALKPHOS 62 04/10/2017   BILITOT 0.8 04/10/2017   GFRNONAA 40 (L) 04/10/2017   GFRAA 46 (L) 04/10/2017    Lab Results  Component Value  Date   WBC 6.3 04/10/2017   NEUTROABS 4.3 04/10/2017   HGB 14.6 04/10/2017   HCT 42.7 04/10/2017   MCV 88.4 04/10/2017   PLT 296 04/10/2017     STUDIES: Ct Abdomen Pelvis W Contrast  Result Date: 04/14/2017 CLINICAL DATA:  Bladder cancer diagnosed October 2013 with surgery in May 2014. Last chemotherapy in 2014. Decreased energy. Decreased  appetite. Weight loss. EXAM: CT ABDOMEN AND PELVIS WITH CONTRAST TECHNIQUE: Multidetector CT imaging of the abdomen and pelvis was performed using the standard protocol following bolus administration of intravenous contrast. CONTRAST:  64mL ISOVUE-300 IOPAMIDOL (ISOVUE-300) INJECTION 61% COMPARISON:  03/29/2016 abdominal MRI. Most recent CT of 02/19/2015. FINDINGS: Lower chest: Subpleural right lower lobe pulmonary nodule measures 2 mm on image 6/series 5, similar. Centrilobular emphysema. A partially cavitary left lower lobe lesion measures 2.5 x 1.1 cm on image 1/series 5. This area has not been imaged on prior abdominal studies. The cavitary portion was present back on 04/28/2013, but is newly thick walled however. The solid portion laterally, including at 7 mm, is new. Normal heart size without pericardial or pleural effusion. Multivessel coronary artery atherosclerosis. Tiny hiatal hernia. Contrast in the lower esophagus. Hepatobiliary: Normal liver. Multiple 5 mm gallstones with resolution of pericholecystic edema. Stone in the gallbladder neck at 9 mm on image 25/ series 2. No biliary duct dilatation. Pancreas: Normal, without mass or ductal dilatation. Spleen: Normal in size, without focal abnormality. Adrenals/Urinary Tract: Bilateral adrenal thickening. Moderate right renal cortical thinning. No hydronephrosis. Punctate lower pole left renal collecting system calculus. Status post cystectomy with ileal conduit creation. No acute complication identified. Small parastomal hernia containing fat. Stomach/Bowel: Normal remainder of the stomach. Colonic stool burden suggests constipation. Normal terminal ileum and appendix. Otherwise normal small bowel. Vascular/Lymphatic: Aortic and branch vessel atherosclerosis. Abdominopelvic node dissection. No abdominopelvic adenopathy. Reproductive: Prostatectomy, without locally recurrent disease. Other: No significant free fluid. Fat containing ventral abdominal wall  hernia is unchanged. Musculoskeletal: Disc bulges at L4-5 and L5-S1. IMPRESSION: 1. Status post cysto prostatectomy, without recurrent or metastatic disease. No acute process in the abdomen or pelvis. 2. Left lower lobe partially cavitary mildly thick walled lesion is either new or changed in morphology compared to 2015. This is suspicious for primary bronchogenic carcinoma. Incompletely imaged. Consider dedicated chest CT. These results will be called to the ordering clinician or representative by the Radiologist Assistant, and communication documented in the PACS or zVision Dashboard. 3. Left nephrolithiasis. 4.  Possible constipation. 5. Cholelithiasis. 6. Parastomal and ventral abdominal wall hernias containing fat. 7.  Coronary artery atherosclerosis. Aortic atherosclerosis. 8. Tiny hiatal hernia. Esophageal fluid level suggests dysmotility or gastroesophageal reflux. Electronically Signed   By: Abigail Miyamoto M.D.   On: 04/14/2017 14:17    ASSESSMENT: Pathologic stage III transitional cell carcinoma, status post radical cystectomy. Left lower lobe lung mass.  PLAN:    1.  Bladder cancer:  Patient completed neoadjuvant chemotherapy followed by total cystectomy in May 2014. CT scan results from May 2016 did not reveal any evidence of recurrence. MRI the abdomen on March 29, 2016 also with no evidence of recurrence.  2.  Renal insufficiency: Creatinine is approximately at his baseline. Monitor.  3.  Cholecystitis: Followed by surgery. 4.  Weakness and fatigue:  CBC, iron stores and thyroid panel are within normal limits. 5.  Left lower lobe lung mass: Unclear etiology, but highly suspicious for a second primary. Will get a PET scan and biopsy for further evaluation. Patient will return to  clinic in 3-4 days after his biopsy to discuss the results and treatment planning if necessary.  Approximately 30 minutes was spent in discussion of which greater than 50% was consultation.  Patient expressed  understanding and was in agreement with this plan. He also understands that He can call clinic at any time with any questions, concerns, or complaints.   Bladder cancer   Staging form: Urinary Bladder, AJCC 7th Edition     Clinical stage from 05/25/2015: Stage III (T3, N0, M0) - Signed by Lloyd Huger, MD on 05/25/2015   Lloyd Huger, MD 04/16/17 10:58 AM

## 2017-04-15 NOTE — Telephone Encounter (Signed)
Patient has appointment thursday

## 2017-04-16 ENCOUNTER — Inpatient Hospital Stay: Payer: Medicare Other | Attending: Oncology | Admitting: Oncology

## 2017-04-16 VITALS — BP 124/76 | HR 78 | Temp 97.7°F | Ht 72.0 in | Wt 190.6 lb

## 2017-04-16 DIAGNOSIS — R5383 Other fatigue: Secondary | ICD-10-CM | POA: Diagnosis not present

## 2017-04-16 DIAGNOSIS — J432 Centrilobular emphysema: Secondary | ICD-10-CM | POA: Diagnosis not present

## 2017-04-16 DIAGNOSIS — K449 Diaphragmatic hernia without obstruction or gangrene: Secondary | ICD-10-CM | POA: Diagnosis not present

## 2017-04-16 DIAGNOSIS — R531 Weakness: Secondary | ICD-10-CM | POA: Diagnosis not present

## 2017-04-16 DIAGNOSIS — I2510 Atherosclerotic heart disease of native coronary artery without angina pectoris: Secondary | ICD-10-CM | POA: Insufficient documentation

## 2017-04-16 DIAGNOSIS — K435 Parastomal hernia without obstruction or  gangrene: Secondary | ICD-10-CM | POA: Insufficient documentation

## 2017-04-16 DIAGNOSIS — K8010 Calculus of gallbladder with chronic cholecystitis without obstruction: Secondary | ICD-10-CM | POA: Diagnosis not present

## 2017-04-16 DIAGNOSIS — M5126 Other intervertebral disc displacement, lumbar region: Secondary | ICD-10-CM | POA: Insufficient documentation

## 2017-04-16 DIAGNOSIS — K5730 Diverticulosis of large intestine without perforation or abscess without bleeding: Secondary | ICD-10-CM | POA: Insufficient documentation

## 2017-04-16 DIAGNOSIS — R634 Abnormal weight loss: Secondary | ICD-10-CM | POA: Diagnosis not present

## 2017-04-16 DIAGNOSIS — R918 Other nonspecific abnormal finding of lung field: Secondary | ICD-10-CM | POA: Diagnosis not present

## 2017-04-16 DIAGNOSIS — N261 Atrophy of kidney (terminal): Secondary | ICD-10-CM | POA: Diagnosis not present

## 2017-04-16 DIAGNOSIS — Z905 Acquired absence of kidney: Secondary | ICD-10-CM | POA: Insufficient documentation

## 2017-04-16 DIAGNOSIS — Z87891 Personal history of nicotine dependence: Secondary | ICD-10-CM | POA: Diagnosis not present

## 2017-04-16 DIAGNOSIS — I700 Atherosclerosis of aorta: Secondary | ICD-10-CM

## 2017-04-16 DIAGNOSIS — I77811 Abdominal aortic ectasia: Secondary | ICD-10-CM | POA: Insufficient documentation

## 2017-04-16 DIAGNOSIS — Z9221 Personal history of antineoplastic chemotherapy: Secondary | ICD-10-CM

## 2017-04-16 DIAGNOSIS — Z8551 Personal history of malignant neoplasm of bladder: Secondary | ICD-10-CM

## 2017-04-16 DIAGNOSIS — N200 Calculus of kidney: Secondary | ICD-10-CM | POA: Insufficient documentation

## 2017-04-16 DIAGNOSIS — N2889 Other specified disorders of kidney and ureter: Secondary | ICD-10-CM | POA: Insufficient documentation

## 2017-04-16 DIAGNOSIS — F419 Anxiety disorder, unspecified: Secondary | ICD-10-CM | POA: Insufficient documentation

## 2017-04-16 DIAGNOSIS — C679 Malignant neoplasm of bladder, unspecified: Secondary | ICD-10-CM

## 2017-04-16 DIAGNOSIS — F17210 Nicotine dependence, cigarettes, uncomplicated: Secondary | ICD-10-CM | POA: Diagnosis not present

## 2017-04-16 NOTE — Progress Notes (Signed)
Patient here for results. No changes since last appointment 

## 2017-04-17 ENCOUNTER — Other Ambulatory Visit: Payer: Self-pay | Admitting: Oncology

## 2017-04-17 DIAGNOSIS — R918 Other nonspecific abnormal finding of lung field: Secondary | ICD-10-CM

## 2017-04-21 ENCOUNTER — Ambulatory Visit
Admission: RE | Admit: 2017-04-21 | Discharge: 2017-04-21 | Disposition: A | Discharge status: Home / Self Care | Payer: Medicare Other | Source: Ambulatory Visit | Attending: Oncology | Admitting: Oncology

## 2017-04-21 DIAGNOSIS — I700 Atherosclerosis of aorta: Secondary | ICD-10-CM | POA: Insufficient documentation

## 2017-04-21 DIAGNOSIS — N261 Atrophy of kidney (terminal): Secondary | ICD-10-CM | POA: Diagnosis not present

## 2017-04-21 DIAGNOSIS — J439 Emphysema, unspecified: Secondary | ICD-10-CM | POA: Diagnosis not present

## 2017-04-21 DIAGNOSIS — D414 Neoplasm of uncertain behavior of bladder: Secondary | ICD-10-CM | POA: Diagnosis not present

## 2017-04-21 DIAGNOSIS — C679 Malignant neoplasm of bladder, unspecified: Secondary | ICD-10-CM | POA: Insufficient documentation

## 2017-04-21 DIAGNOSIS — K5730 Diverticulosis of large intestine without perforation or abscess without bleeding: Secondary | ICD-10-CM | POA: Insufficient documentation

## 2017-04-21 DIAGNOSIS — I2510 Atherosclerotic heart disease of native coronary artery without angina pectoris: Secondary | ICD-10-CM | POA: Diagnosis not present

## 2017-04-21 DIAGNOSIS — K8020 Calculus of gallbladder without cholecystitis without obstruction: Secondary | ICD-10-CM | POA: Insufficient documentation

## 2017-04-21 DIAGNOSIS — R911 Solitary pulmonary nodule: Secondary | ICD-10-CM | POA: Insufficient documentation

## 2017-04-21 LAB — GLUCOSE, CAPILLARY: Glucose-Capillary: 108 mg/dL — ABNORMAL HIGH (ref 65–99)

## 2017-04-21 MED ORDER — FLUDEOXYGLUCOSE F - 18 (FDG) INJECTION
13.2500 | Freq: Once | INTRAVENOUS | Status: AC | PRN
  Administered 2017-04-21: 13.25 via INTRAVENOUS

## 2017-04-22 ENCOUNTER — Other Ambulatory Visit: Payer: Self-pay | Admitting: Oncology

## 2017-04-22 NOTE — Progress Notes (Signed)
[place]  Telephone:([phone]) [phone] [phone]) [phone]  ID: Rodney Day OB: 09/26/1946  MR#: [id number]  NIO#:[id number]  Patient Care Team: Lloyd Huger, MD as PCP - General (Oncology)  CHIEF COMPLAINT: Pathologic stage III transitional cell carcinoma, status post radical cystectomy. Left lower lobe lung mass.  INTERVAL HISTORY: Patient returns to clinic today for further evaluation and discussion of his PET scan results. He continues to have increased weakness and fatigue. He has a poor appetite, but does not report any additional weight loss. He denies any recent fevers.  He has no neurologic complaints.  He denies any chest pain or shortness of breath.  He denies any nausea, vomiting, constipation, or diarrhea.  Patient offers no further specific complaints today.   REVIEW OF SYSTEMS:   Review of Systems  Constitutional: Positive for malaise/fatigue. Negative for fever and weight loss.  HENT: Negative for congestion.   Respiratory: Negative.  Negative for cough and shortness of breath.   Cardiovascular: Negative.  Negative for chest pain and leg swelling.  Gastrointestinal: Negative for abdominal pain and nausea.  Genitourinary: Negative.   Musculoskeletal: Negative.   Skin: Negative.  Negative for rash.  Neurological: Positive for weakness. Negative for sensory change.  Psychiatric/Behavioral: The patient is nervous/anxious.     As per HPI. Otherwise, a complete review of systems is negative.  PAST MEDICAL HISTORY: Past Medical History:  Diagnosis Date  . Malignant neoplasm of bladder ([place])     PAST SURGICAL HISTORY: Total cystectomy.  FAMILY HISTORY: Father with bladder and prostate cancer, grandmother with thyroid cancer.     ADVANCED DIRECTIVES:    HEALTH MAINTENANCE: Social History  Substance Use Topics  . Smoking status: Former Smoker    Packs/day: 0.25    Years: 51.00    Types: Cigarettes    Quit date: 07/25/2013  .  Smokeless tobacco: Never Used  . Alcohol use 0.0 oz/week     Comment: 1 beer every 2 weeks     Colonoscopy:  PAP:  Bone density:  Lipid panel:  No Known Allergies  Current Outpatient Prescriptions  Medication Sig Dispense Refill  . ibuprofen (ADVIL,MOTRIN) 200 MG tablet Take 400 mg by mouth every 8 (eight) hours as needed for mild pain.     . Multiple Vitamin (MULTIVITAMIN WITH MINERALS) TABS tablet Take 1 tablet by mouth daily.     No current facility-administered medications for this visit.     OBJECTIVE: Vitals:   04/24/17 1005  BP: 118/76  Pulse: 80  Resp: 18  Temp: (!) 96.6 F (35.9 C)     Body mass index is 25.5 kg/m.    ECOG FS:0 - Asymptomatic  General: Well-developed, well-nourished, no acute distress. Eyes: anicteric sclera. Lungs: Clear to auscultation bilaterally. Heart: Regular rate and rhythm. No rubs, murmurs, or gallops. Abdomen: Soft, nontender, nondistended. No organomegaly noted, normoactive bowel sounds. Urostomy tubes noted. Musculoskeletal: No edema, cyanosis, or clubbing. Neuro: Alert, answering all questions appropriately. Cranial nerves grossly intact. Skin: No rashes or petechiae noted. Psych: Normal affect.  LAB RESULTS:  Lab Results  Component Value Date   NA 130 (L) 04/10/2017   K 4.4 04/10/2017   CL 98 (L) 04/10/2017   CO2 25 04/10/2017   GLUCOSE 110 (H) 04/10/2017   BUN 19 04/10/2017   CREATININE 1.68 (H) 04/10/2017   CALCIUM 9.6 04/10/2017   PROT 7.9 04/10/2017   ALBUMIN 4.4 04/10/2017   AST 18 04/10/2017   ALT 13 (L) 04/10/2017   ALKPHOS 62 04/10/2017  BILITOT 0.8 04/10/2017   GFRNONAA 40 (L) 04/10/2017   GFRAA 46 (L) 04/10/2017    Lab Results  Component Value Date   WBC 6.3 04/10/2017   NEUTROABS 4.3 04/10/2017   HGB 14.6 04/10/2017   HCT 42.7 04/10/2017   MCV 88.4 04/10/2017   PLT 296 04/10/2017     STUDIES: Ct Chest W Contrast  Result Date: 04/23/2017 CLINICAL DATA:  Malignant neoplasm of the urinary  bladder. Evaluate lung mass EXAM: CT CHEST WITH CONTRAST TECHNIQUE: Multidetector CT imaging of the chest was performed during intravenous contrast administration. CONTRAST:  83mL ISOVUE-300 IOPAMIDOL (ISOVUE-300) INJECTION 61% COMPARISON:  04/21/2017 FINDINGS: Cardiovascular: The heart size appears within normal limits. Aortic atherosclerosis noted. Calcifications within the LAD and left circumflex and RCA coronary arteries noted. Mediastinum/Nodes: The left hilar lymph node is enlarged measuring 1.4 cm, image 81 of series 2. No enlarged mediastinal or right hilar lymph nodes. The trachea appears patent and is midline. Normal appearance of the esophagus. Lungs/Pleura: Moderate changes of centrilobular and paraseptal emphysema. There is no pleural effusion identified. Cavitary lesion within the left lower lobe is again noted measuring 2.2 cm, image 99 of series 3. Upper Abdomen: Multiple gallstones are identified. Stone within the cystic duct measures 8 mm. Nodule in the left adrenal gland appears unchanged. No acute abnormality noted within the upper abdomen. Musculoskeletal: No aggressive lytic or sclerotic bone lesions. IMPRESSION: 1. Cavitary lesion in the left lower lobe is again noted in remains suspicious for bronchogenic carcinoma versus metastatic disease. Enlarged left hilar lymph node compatible with metastatic adenopathy. 2. Aortic Atherosclerosis (ICD10-I70.0) and Emphysema (ICD10-J43.9). 3. Gallstones. 4. Multi vessel coronary artery calcification. Electronically Signed   By: Kerby Moors M.D.   On: 04/23/2017 11:18   Ct Abdomen Pelvis W Contrast  Result Date: 04/14/2017 CLINICAL DATA:  Bladder cancer diagnosed October 2013 with surgery in May 2014. Last chemotherapy in 2014. Decreased energy. Decreased appetite. Weight loss. EXAM: CT ABDOMEN AND PELVIS WITH CONTRAST TECHNIQUE: Multidetector CT imaging of the abdomen and pelvis was performed using the standard protocol following bolus  administration of intravenous contrast. CONTRAST:  26mL ISOVUE-300 IOPAMIDOL (ISOVUE-300) INJECTION 61% COMPARISON:  03/29/2016 abdominal MRI. Most recent CT of 02/19/2015. FINDINGS: Lower chest: Subpleural right lower lobe pulmonary nodule measures 2 mm on image 6/series 5, similar. Centrilobular emphysema. A partially cavitary left lower lobe lesion measures 2.5 x 1.1 cm on image 1/series 5. This area has not been imaged on prior abdominal studies. The cavitary portion was present back on 04/28/2013, but is newly thick walled however. The solid portion laterally, including at 7 mm, is new. Normal heart size without pericardial or pleural effusion. Multivessel coronary artery atherosclerosis. Tiny hiatal hernia. Contrast in the lower esophagus. Hepatobiliary: Normal liver. Multiple 5 mm gallstones with resolution of pericholecystic edema. Stone in the gallbladder neck at 9 mm on image 25/ series 2. No biliary duct dilatation. Pancreas: Normal, without mass or ductal dilatation. Spleen: Normal in size, without focal abnormality. Adrenals/Urinary Tract: Bilateral adrenal thickening. Moderate right renal cortical thinning. No hydronephrosis. Punctate lower pole left renal collecting system calculus. Status post cystectomy with ileal conduit creation. No acute complication identified. Small parastomal hernia containing fat. Stomach/Bowel: Normal remainder of the stomach. Colonic stool burden suggests constipation. Normal terminal ileum and appendix. Otherwise normal small bowel. Vascular/Lymphatic: Aortic and branch vessel atherosclerosis. Abdominopelvic node dissection. No abdominopelvic adenopathy. Reproductive: Prostatectomy, without locally recurrent disease. Other: No significant free fluid. Fat containing ventral abdominal wall hernia is unchanged. Musculoskeletal: Disc  bulges at L4-5 and L5-S1. IMPRESSION: 1. Status post cysto prostatectomy, without recurrent or metastatic disease. No acute process in the  abdomen or pelvis. 2. Left lower lobe partially cavitary mildly thick walled lesion is either new or changed in morphology compared to 2015. This is suspicious for primary bronchogenic carcinoma. Incompletely imaged. Consider dedicated chest CT. These results will be called to the ordering clinician or representative by the Radiologist Assistant, and communication documented in the PACS or zVision Dashboard. 3. Left nephrolithiasis. 4.  Possible constipation. 5. Cholelithiasis. 6. Parastomal and ventral abdominal wall hernias containing fat. 7.  Coronary artery atherosclerosis. Aortic atherosclerosis. 8. Tiny hiatal hernia. Esophageal fluid level suggests dysmotility or gastroesophageal reflux. Electronically Signed   By: Abigail Miyamoto M.D.   On: 04/14/2017 14:17   Nm Pet Image Restag (ps) Skull Base To Thigh  Result Date: 04/21/2017 CLINICAL DATA:  Subsequent treatment strategy for bladder tumor. EXAM: NUCLEAR MEDICINE PET SKULL BASE TO THIGH TECHNIQUE: 13.3 mCi F-18 FDG was injected intravenously. Full-ring PET imaging was performed from the skull base to thigh after the radiotracer. CT data was obtained and used for attenuation correction and anatomic localization. FASTING BLOOD GLUCOSE:  Value: 103 mg/dl COMPARISON:  Multiple exams, including 04/14/2017 FINDINGS: NECK No significant adenopathy is observed in the neck. Carotid atherosclerotic calcification noted. There is some misregistration in the neck noted. CHEST Cavitary left lower lobe nodule 2.5 by 1.5 cm on image 115/3 with maximum standard uptake value 6.8. 1.5 cm left infrahilar lymph node on image 107/3 with maximum SUV 5.1. Background mediastinal blood pool activity 2.9. There is some faint subpleural nodularity along both upper lobes, not hypermetabolic but well below sensitive PET-CT size thresholds. Mild biapical pleuroparenchymal scarring. Paraseptal emphysema. Coronary, aortic arch, and branch vessel atherosclerotic vascular disease.  ABDOMEN/PELVIS Small amount of activity along the right iliac vessels just below the bifurcation is attributed to the left ureter crossing over to the ileal conduit. No appreciable malignancy in the abdomen or pelvis. Cholelithiasis. Prominent stool throughout the colon favors constipation. Atrophic right kidney. Possible 1-2 mm nonobstructive left kidney lower pole renal calculi. Infrarenal abdominal aortic ectasia, 2.8 cm transverse. Cystoprostatectomy.  Sigmoid colon diverticulosis. SKELETON No focal hypermetabolic activity to suggest skeletal metastasis. IMPRESSION: 1. Hypermetabolic cavitary 2.5 by 1.5 cm left lower lobe nodule with adjacent hypermetabolic left infrahilar adenopathy. Possibilities include primary lung cancer, metastatic disease with some local lymphatic drainage, or less likely granulomatous infectious process such as tuberculosis. 2. Aortic Atherosclerosis (ICD10-I70.0) and Emphysema (ICD10-J43.9). Coronary atherosclerosis. 3. Cholelithiasis. 4. Possible nonobstructive left nephrolithiasis. Prior cystoprostatectomy with right ileal conduit. 5. Sigmoid colon diverticulosis. 6. Atrophic right kidney. Electronically Signed   By: Van Clines M.D.   On: 04/21/2017 13:39    ASSESSMENT: Pathologic stage III transitional cell carcinoma, status post radical cystectomy. Left lower lobe lung mass.  PLAN:    1.  Bladder cancer:  Patient completed neoadjuvant chemotherapy followed by total cystectomy in May 2014. CT scan results from May 2016 did not reveal any evidence of recurrence. MRI the abdomen on March 29, 2016 also with no evidence of recurrence.  2.  Renal insufficiency: Creatinine is approximately at his baseline. Monitor.  3.  Cholecystitis: Followed by surgery. 4.  Weakness and fatigue:  CBC, iron stores and thyroid panel are within normal limits. 5.  Left lower lobe lung mass: Unclear etiology, but highly suspicious for a second primary. PET scan results reviewed  independently and reported as above. There is also possibility that this could  be infectious in nature. A referral has been made to pulmonology and his appointment is on May 04, 2017. Patient will likely undergo bronchoscopy and/or EBUS for further evaluation. Return to clinic on May 22, 2017 after his procedure to discuss the results.   Approximately 30 minutes was spent in discussion of which greater than 50% was consultation.  Patient expressed understanding and was in agreement with this plan. He also understands that He can call clinic at any time with any questions, concerns, or complaints.   Bladder cancer   Staging form: Urinary Bladder, AJCC 7th Edition     Clinical stage from 05/25/2015: Stage III (T3, N0, M0) - Signed by Lloyd Huger, MD on 05/25/2015   Lloyd Huger, MD 04/26/17 7:27 AM

## 2017-04-23 ENCOUNTER — Other Ambulatory Visit: Payer: Self-pay | Admitting: *Deleted

## 2017-04-23 ENCOUNTER — Ambulatory Visit
Admission: RE | Admit: 2017-04-23 | Discharge: 2017-04-23 | Disposition: A | Discharge status: Home / Self Care | Payer: Medicare Other | Source: Ambulatory Visit | Attending: Oncology | Admitting: Oncology

## 2017-04-23 DIAGNOSIS — R918 Other nonspecific abnormal finding of lung field: Secondary | ICD-10-CM

## 2017-04-23 DIAGNOSIS — J439 Emphysema, unspecified: Secondary | ICD-10-CM | POA: Diagnosis not present

## 2017-04-23 DIAGNOSIS — C679 Malignant neoplasm of bladder, unspecified: Secondary | ICD-10-CM | POA: Diagnosis not present

## 2017-04-23 DIAGNOSIS — K8020 Calculus of gallbladder without cholecystitis without obstruction: Secondary | ICD-10-CM | POA: Diagnosis not present

## 2017-04-23 DIAGNOSIS — R599 Enlarged lymph nodes, unspecified: Secondary | ICD-10-CM | POA: Insufficient documentation

## 2017-04-23 DIAGNOSIS — I2510 Atherosclerotic heart disease of native coronary artery without angina pectoris: Secondary | ICD-10-CM | POA: Diagnosis not present

## 2017-04-23 DIAGNOSIS — I700 Atherosclerosis of aorta: Secondary | ICD-10-CM | POA: Insufficient documentation

## 2017-04-23 MED ORDER — IOPAMIDOL (ISOVUE-300) INJECTION 61%
60.0000 mL | Freq: Once | INTRAVENOUS | Status: AC | PRN
  Administered 2017-04-23: 60 mL via INTRAVENOUS

## 2017-04-24 ENCOUNTER — Inpatient Hospital Stay (HOSPITAL_BASED_OUTPATIENT_CLINIC_OR_DEPARTMENT_OTHER): Payer: Medicare Other | Admitting: Oncology

## 2017-04-24 VITALS — BP 118/76 | HR 80 | Temp 96.6°F | Resp 18 | Wt 188.1 lb

## 2017-04-24 DIAGNOSIS — Z8551 Personal history of malignant neoplasm of bladder: Secondary | ICD-10-CM

## 2017-04-24 DIAGNOSIS — Z905 Acquired absence of kidney: Secondary | ICD-10-CM

## 2017-04-24 DIAGNOSIS — F17210 Nicotine dependence, cigarettes, uncomplicated: Secondary | ICD-10-CM | POA: Diagnosis not present

## 2017-04-24 DIAGNOSIS — J432 Centrilobular emphysema: Secondary | ICD-10-CM | POA: Diagnosis not present

## 2017-04-24 DIAGNOSIS — M5126 Other intervertebral disc displacement, lumbar region: Secondary | ICD-10-CM

## 2017-04-24 DIAGNOSIS — R5383 Other fatigue: Secondary | ICD-10-CM

## 2017-04-24 DIAGNOSIS — R531 Weakness: Secondary | ICD-10-CM

## 2017-04-24 DIAGNOSIS — N2889 Other specified disorders of kidney and ureter: Secondary | ICD-10-CM

## 2017-04-24 DIAGNOSIS — K449 Diaphragmatic hernia without obstruction or gangrene: Secondary | ICD-10-CM | POA: Diagnosis not present

## 2017-04-24 DIAGNOSIS — Z87891 Personal history of nicotine dependence: Secondary | ICD-10-CM

## 2017-04-24 DIAGNOSIS — N200 Calculus of kidney: Secondary | ICD-10-CM

## 2017-04-24 DIAGNOSIS — F419 Anxiety disorder, unspecified: Secondary | ICD-10-CM

## 2017-04-24 DIAGNOSIS — R634 Abnormal weight loss: Secondary | ICD-10-CM | POA: Diagnosis not present

## 2017-04-24 DIAGNOSIS — R918 Other nonspecific abnormal finding of lung field: Secondary | ICD-10-CM | POA: Diagnosis not present

## 2017-04-24 DIAGNOSIS — I700 Atherosclerosis of aorta: Secondary | ICD-10-CM

## 2017-04-24 DIAGNOSIS — N261 Atrophy of kidney (terminal): Secondary | ICD-10-CM

## 2017-04-24 DIAGNOSIS — K8010 Calculus of gallbladder with chronic cholecystitis without obstruction: Secondary | ICD-10-CM

## 2017-04-24 DIAGNOSIS — Z9221 Personal history of antineoplastic chemotherapy: Secondary | ICD-10-CM

## 2017-04-24 DIAGNOSIS — K435 Parastomal hernia without obstruction or  gangrene: Secondary | ICD-10-CM

## 2017-04-24 DIAGNOSIS — I77811 Abdominal aortic ectasia: Secondary | ICD-10-CM

## 2017-04-24 DIAGNOSIS — K5730 Diverticulosis of large intestine without perforation or abscess without bleeding: Secondary | ICD-10-CM

## 2017-04-24 DIAGNOSIS — C679 Malignant neoplasm of bladder, unspecified: Secondary | ICD-10-CM

## 2017-04-24 DIAGNOSIS — I2510 Atherosclerotic heart disease of native coronary artery without angina pectoris: Secondary | ICD-10-CM | POA: Diagnosis not present

## 2017-04-24 NOTE — Progress Notes (Signed)
Patient states he is tired and sleeps a lot.  Also does not have much of an appetite.

## 2017-04-28 ENCOUNTER — Encounter: Payer: Self-pay | Admitting: Internal Medicine

## 2017-04-28 ENCOUNTER — Telehealth: Payer: Self-pay | Admitting: *Deleted

## 2017-04-28 ENCOUNTER — Ambulatory Visit (INDEPENDENT_AMBULATORY_CARE_PROVIDER_SITE_OTHER): Payer: Medicare Other | Admitting: Internal Medicine

## 2017-04-28 ENCOUNTER — Encounter
Admission: RE | Admit: 2017-04-28 | Discharge: 2017-04-28 | Disposition: A | Discharge status: Home / Self Care | Payer: Medicare Other | Source: Ambulatory Visit | Attending: Internal Medicine | Admitting: Internal Medicine

## 2017-04-28 VITALS — BP 114/58 | HR 91 | Ht 72.0 in | Wt 185.0 lb

## 2017-04-28 DIAGNOSIS — F0390 Unspecified dementia without behavioral disturbance: Secondary | ICD-10-CM | POA: Diagnosis not present

## 2017-04-28 DIAGNOSIS — J449 Chronic obstructive pulmonary disease, unspecified: Secondary | ICD-10-CM

## 2017-04-28 DIAGNOSIS — Z8551 Personal history of malignant neoplasm of bladder: Secondary | ICD-10-CM | POA: Diagnosis not present

## 2017-04-28 DIAGNOSIS — F17210 Nicotine dependence, cigarettes, uncomplicated: Secondary | ICD-10-CM | POA: Diagnosis not present

## 2017-04-28 DIAGNOSIS — R918 Other nonspecific abnormal finding of lung field: Secondary | ICD-10-CM | POA: Diagnosis not present

## 2017-04-28 DIAGNOSIS — R0602 Shortness of breath: Secondary | ICD-10-CM | POA: Diagnosis not present

## 2017-04-28 MED ORDER — GLYCOPYRROLATE-FORMOTEROL 9-4.8 MCG/ACT IN AERO: 2.0000 | INHALATION_SPRAY | Freq: Two times a day (BID) | RESPIRATORY_TRACT | 5 refills | Status: AC

## 2017-04-28 MED ORDER — ALBUTEROL SULFATE HFA 108 (90 BASE) MCG/ACT IN AERS: 2.0000 | INHALATION_SPRAY | RESPIRATORY_TRACT | 6 refills | Status: AC | PRN

## 2017-04-28 NOTE — Telephone Encounter (Signed)
Spoke with wife and informed her to call pharmacy portion of insurance and ask them what is covered in place of Bevespi and what the copay is and to call me back with the names and the copays and we will forward to DK for a medication change. Will await on call back form pt's wife.

## 2017-04-28 NOTE — Progress Notes (Signed)
Name: Rodney Day MRN: [id number] DOB: 1946/07/18       CONSULTATION DATE: 04/28/2017   REFERRING MD : Gaetana Michaelis  CHIEF COMPLAINT: I have som SOB  SIGNIFICANT EVENTS  Previous DX of bladder cancer, urostomy tube in place    HISTORY OF PRESENT ILLNESS:  71 yo white male with previous h/o Bladder cancer presents  with h/o SOB and abnormal CT chest.  Patient has had chronic SOB and DOE for many years Still smokes 1 ppd for 50 years Associated with fatigue and loss of energy with Weight loss Denies cough, denies fevers, denies wheezing  Patient was getting restaging work up for bladder cancer and CT abd showed Left Lower lobe cavitary nodule and subsequently had CT and PET scan that shows LLL nodule with Left Hilar nodule  I have explained the possibility that this could be primary lung cancer   The Risks and Benefits of the Bronchoscopy with EBUS/ENB were explained to patient/family and I have discussed the risk for acute bleeding, increased chance of infection, increased chance of respiratory failure and cardiac arrest and death. I have also explained to avoid all types of NSAIDs to decrease chance of bleeding, and to avoid food and drinks the midnight prior to procedure.  The procedure consists of a video camera with a light source to be placed and inserted  into the lungs to  look for abnormal tissue and to obtain tissue samples by using needle and biopsy tools.  The patient/family understand the risks and benefits and have agreed to proceed with procedure.   The patient has expressed that he may NOT want to go through any chemo or radiation therapy and he is hesitant at this time to move forward with procedure.  I have explained that we will keep him on schedule and plan for for it unless he tells [place] definitively.     PAST MEDICAL HISTORY :   has a past medical history of Malignant neoplasm of bladder ([place]).  has a past surgical history that includes  Transurethral resection of bladder tumor; Pr Cystectomy,Ileal Conduit/Sigmoid Bladder (05/13/2013); Pr Remove Pelvis Lymph Nodes (05/13/2013); and ERCP (N/A, 04/09/2016). Prior to Admission medications   Medication Sig Start Date End Date Taking? Authorizing Provider  ibuprofen (ADVIL,MOTRIN) 200 MG tablet Take 400 mg by mouth every 8 (eight) hours as needed for mild pain.    Yes [provider]  Multiple Vitamin (MULTIVITAMIN WITH MINERALS) TABS tablet Take 1 tablet by mouth daily.   Yes [provider]  albuterol (PROVENTIL HFA;VENTOLIN HFA) 108 (90 Base) MCG/ACT inhaler Inhale 2 puffs into the lungs every 4 (four) hours as needed for wheezing or shortness of breath. 04/28/17   Flora Lipps, MD  Glycopyrrolate-Formoterol (BEVESPI AEROSPHERE) 9-4.8 MCG/ACT AERO Inhale 2 puffs into the lungs 2 (two) times daily. 04/28/17   Flora Lipps, MD   No Known Allergies  FAMILY HISTORY:  family history includes Bladder Cancer in his father; Dementia in his mother; Diabetes in his mother; Prostate cancer in his father; Stroke in his paternal uncle. SOCIAL HISTORY:  reports that he has been smoking Cigarettes.  He has a 12.75 pack-year smoking history. He has never used smokeless tobacco. He reports that he drinks alcohol. He reports that he does not use drugs.  REVIEW OF SYSTEMS:   Constitutional: + malaise/fatigue   HENT: Negative for hearing loss, ear pain, nosebleeds, congestion, sore throat, neck pain, tinnitus and ear discharge.   Eyes: Negative for blurred vision, double vision, photophobia,  pain, discharge and redness.  Respiratory: -Negative for cough,- hemoptysis, -sputum production, +shortness of breath, -wheezing and -stridor.   Cardiovascular: Negative for chest pain, palpitations, orthopnea, claudication, leg swelling and PND.  Gastrointestinal: Negative for heartburn, nausea, vomiting, abdominal pain, diarrhea, constipation, blood in stool and melena.  Genitourinary: Negative  for dysuria, urgency, frequency, hematuria and flank pain.  Musculoskeletal: Negative for myalgias, back pain, joint pain and falls.  Skin: Negative for itching and rash.  Neurological: Negative for dizziness, tingling, tremors, sensory change, speech change, focal weakness, seizures, loss of consciousness, weakness and headaches.  Endo/Heme/Allergies: Negative for environmental allergies and polydipsia. Does not bruise/bleed easily.   VITAL SIGNS: BP (!) 114/58 (BP Location: Left Arm, Cuff Size: Normal)   Pulse 91   Ht 6' (1.829 m)   Wt 185 lb (83.9 kg)   SpO2 97%   BMI 25.09 kg/m    PHYSICAL EXAMINATION: Physical Examination:   GENERAL:NAD, no fevers, chills, no weakness no fatigue HEAD: Normocephalic, atraumatic.  EYES: Pupils equal, round, reactive to light. Extraocular muscles intact. No scleral icterus.  MOUTH: Moist mucosal membrane. Dentition intact. No abscess noted.  EAR, NOSE, THROAT: Clear without exudates. No external lesions.  NECK: Supple. No thyromegaly. No nodules. No JVD.  PULMONARY:CTA b/l no wheeezes CARDIOVASCULAR: S1 and S2. Regular rate and rhythm. No murmurs, rubs, or gallops. No edema. Pedal pulses 2+ bilaterally.  GASTROINTESTINAL: Soft, nontender, nondistended. No masses. Positive bowel sounds. No hepatosplenomegaly.  MUSCULOSKELETAL: No swelling, clubbing, or edema. Range of motion full in all extremities.  NEUROLOGIC: Cranial nerves II through XII are intact. No gross focal neurological deficits. Sensation intact. Reflexes intact.  SKIN: No ulceration, lesions, rashes, or cyanosis. Skin warm and dry. Turgor intact.  PSYCHIATRIC: Mood, affect within normal limits. The patient is awake, alert and oriented x 3. Insight, judgment intact.  ALL OTHER ROS ARE NEGATIVE     CT chest and PET scan I have Independently reviewed images of  Scans  on 04/28/2017 Interpretation:LLL lung nodule +SUV updatke and Left infrahilar node+SUV uptake  ASSESSMENT /  PLAN: 71 yo white male with SOB/DOE most likely related to COPD with underlying LLL nodule with Left Hilar adenopathy likely will need to consider  primary malignant lung cancer.  1.check PFT's 2.check ONO 3.check 6MWT 4.Start Inhaler therapy based on insurance 5.plan for ENB and EBUS, the risks explained to patient.  Procedure Planned for Friday May 18th afternoon at 130pm unless patient cancels   Patient/Family are satisfied with Plan of action and management. All questions answered  Corrin Parker, M.D.  [facility]  Medical Director [place] Director [facility] Cardio-Pulmonary Department

## 2017-04-28 NOTE — Patient Instructions (Signed)
  Your procedure is scheduled on: Friday, May 01, 2017 Report to Same Day Surgery 2nd floor medical mall ([place] Entrance-take elevator on left to 2nd floor.  Check in with surgery information desk.) To find out your arrival time please call [phone] between 1PM - 3PM on Thursday, May 17  Remember: Instructions that are not followed completely may result in serious medical risk, up to and including death, or upon the discretion of your surgeon and anesthesiologist your surgery may need to be rescheduled.    _x___ 1. Do not eat food or drink liquids after midnight. No gum chewing or hard candies.      __x__ 2. No Alcohol for 24 hours before or after surgery.   __x__3. No Smoking for 24 prior to surgery.   ____  4. Bring all medications with you on the day of surgery if instructed.    __x__ 5. Notify your doctor if there is any change in your medical condition     (cold, fever, infections).     Do not wear jewelry, make-up, hairpins, clips or nail polish.  Do not wear lotions, powders, or perfumes. You may wear deodorant.  Do not shave 48 hours prior to surgery. Men may shave face and neck.  Do not bring valuables to the hospital.    [facility] is not responsible for any belongings or valuables.       Patients discharged the day of surgery will not be allowed to drive home.  You will need someone to drive you home and stay with you the night of your procedure.    Please read over the following fact sheets that you were given:   [place] Preparing for Surgery and or MRSA Information   _x___ Take this medication the morning of surgery:   1. Bevespi inhaler   __x__ Use inhalers on the day of surgery and bring to hospital day of surgery    X____Stop Anti-inflammatories such as Advil, Aleve, Ibuprofen, Motrin, Naproxen, Naprosyn, Goodies powders or aspirin products. OK to take Tylenol.   _x___ Stop supplements until after surgery.

## 2017-04-28 NOTE — Patient Instructions (Signed)
Check PFT's  Check 6MWT Check ONO Start Bevespi ALbuterol as needed Refer to Neurology to assess for Dementia  Plan for Bronch/EBUS/ENB for Friday at 130PM.

## 2017-04-28 NOTE — Telephone Encounter (Signed)
wife has called stating pharmacy called and informed them that the Rodney Day is $354. Please advise on a different medication. Thanks.

## 2017-04-28 NOTE — Telephone Encounter (Signed)
Please provide list of meds he can take per insurance

## 2017-04-29 NOTE — Pre-Procedure Instructions (Signed)
Electrocardiogram, 12-lead5/21/2014 [facility] Component Name Value Ref Range  EKG Ventricular Rate 69 BPM   EKG Atrial Rate 69 BPM   EKG P-R Interval 134 ms  EKG QRS Duration 82 ms  EKG Q-T Interval 418 ms  EKG QTC Calculation 447 ms  EKG Calculated P Axis 23 degrees   EKG Calculated R Axis -2 degrees   EKG Calculated T Axis -28 degrees   Result Narrative  NORMAL SINUS RHYTHM INFERIOR INFARCT , AGE UNDETERMINED ABNORMAL ECG NO PREVIOUS ECGS AVAILABLE Confirmed by Cordelia Poche MD, CARLA ANN ([id number]) on 05/04/2013 2:01:36 PM   The above results for comparrison with EKG done on 04/28/17.

## 2017-05-01 ENCOUNTER — Ambulatory Visit: Payer: Medicare Other

## 2017-05-01 ENCOUNTER — Ambulatory Visit
Admission: RE | Admit: 2017-05-01 | Discharge: 2017-05-01 | Disposition: A | Discharge status: Home / Self Care | Payer: Medicare Other | Source: Ambulatory Visit | Attending: Internal Medicine | Admitting: Internal Medicine

## 2017-05-01 ENCOUNTER — Encounter: Payer: Self-pay | Admitting: Anesthesiology

## 2017-05-01 ENCOUNTER — Ambulatory Visit: Payer: Medicare Other | Admitting: Anesthesiology

## 2017-05-01 ENCOUNTER — Encounter
Admission: RE | Disposition: A | Discharge status: Home / Self Care | Payer: Self-pay | Source: Ambulatory Visit | Attending: Internal Medicine

## 2017-05-01 DIAGNOSIS — Z8551 Personal history of malignant neoplasm of bladder: Secondary | ICD-10-CM | POA: Insufficient documentation

## 2017-05-01 DIAGNOSIS — R918 Other nonspecific abnormal finding of lung field: Secondary | ICD-10-CM

## 2017-05-01 DIAGNOSIS — F17210 Nicotine dependence, cigarettes, uncomplicated: Secondary | ICD-10-CM | POA: Diagnosis not present

## 2017-05-01 DIAGNOSIS — R591 Generalized enlarged lymph nodes: Secondary | ICD-10-CM | POA: Diagnosis not present

## 2017-05-01 DIAGNOSIS — R222 Localized swelling, mass and lump, trunk: Secondary | ICD-10-CM | POA: Diagnosis not present

## 2017-05-01 DIAGNOSIS — R599 Enlarged lymph nodes, unspecified: Secondary | ICD-10-CM

## 2017-05-01 DIAGNOSIS — R0602 Shortness of breath: Secondary | ICD-10-CM | POA: Diagnosis not present

## 2017-05-01 HISTORY — PX: ELECTROMAGNETIC NAVIGATION BROCHOSCOPY: SHX5369

## 2017-05-01 SURGERY — ELECTROMAGNETIC NAVIGATION BRONCHOSCOPY
Anesthesia: General

## 2017-05-01 MED ORDER — PROPOFOL 10 MG/ML IV BOLUS
INTRAVENOUS | Status: AC
  Filled 2017-05-01: qty 20

## 2017-05-01 MED ORDER — ONDANSETRON HCL 4 MG/2ML IJ SOLN: 4.0000 mg | Freq: Once | INTRAMUSCULAR | Status: DC | PRN

## 2017-05-01 MED ORDER — LIDOCAINE HCL (CARDIAC) 20 MG/ML IV SOLN: INTRAVENOUS | Status: DC | PRN

## 2017-05-01 MED ORDER — PROPOFOL 10 MG/ML IV BOLUS: INTRAVENOUS | Status: DC | PRN

## 2017-05-01 MED ORDER — SUGAMMADEX SODIUM 200 MG/2ML IV SOLN
INTRAVENOUS | Status: AC
  Filled 2017-05-01: qty 2

## 2017-05-01 MED ORDER — FENTANYL CITRATE (PF) 100 MCG/2ML IJ SOLN
INTRAMUSCULAR | Status: DC | PRN
  Administered 2017-05-01: 50 ug via INTRAVENOUS

## 2017-05-01 MED ORDER — SUGAMMADEX SODIUM 200 MG/2ML IV SOLN
INTRAVENOUS | Status: DC | PRN
  Administered 2017-05-01: 170 mg via INTRAVENOUS

## 2017-05-01 MED ORDER — FENTANYL CITRATE (PF) 100 MCG/2ML IJ SOLN
INTRAMUSCULAR | Status: AC
  Filled 2017-05-01: qty 2

## 2017-05-01 MED ORDER — MIDAZOLAM HCL 5 MG/5ML IJ SOLN
INTRAMUSCULAR | Status: DC | PRN
  Administered 2017-05-01: 1 mg via INTRAVENOUS

## 2017-05-01 MED ORDER — LACTATED RINGERS IV SOLN
INTRAVENOUS | Status: DC
  Administered 2017-05-01: 13:00:00 via INTRAVENOUS

## 2017-05-01 MED ORDER — ROCURONIUM BROMIDE 50 MG/5ML IV SOLN
INTRAVENOUS | Status: AC
  Filled 2017-05-01: qty 1

## 2017-05-01 MED ORDER — SUCCINYLCHOLINE CHLORIDE 20 MG/ML IJ SOLN
INTRAMUSCULAR | Status: DC | PRN
  Administered 2017-05-01: 100 mg via INTRAVENOUS

## 2017-05-01 MED ORDER — FAMOTIDINE 20 MG PO TABS
20.0000 mg | ORAL_TABLET | Freq: Once | ORAL | Status: AC
  Administered 2017-05-01: 20 mg via ORAL

## 2017-05-01 MED ORDER — ROCURONIUM BROMIDE 100 MG/10ML IV SOLN
INTRAVENOUS | Status: DC | PRN
  Administered 2017-05-01: 5 mg via INTRAVENOUS
  Administered 2017-05-01: 25 mg via INTRAVENOUS

## 2017-05-01 MED ORDER — ONDANSETRON HCL 4 MG/2ML IJ SOLN
INTRAMUSCULAR | Status: DC | PRN
  Administered 2017-05-01: 4 mg via INTRAVENOUS

## 2017-05-01 MED ORDER — MIDAZOLAM HCL 2 MG/2ML IJ SOLN
INTRAMUSCULAR | Status: AC
  Filled 2017-05-01: qty 2

## 2017-05-01 MED ORDER — SUCCINYLCHOLINE CHLORIDE 20 MG/ML IJ SOLN
INTRAMUSCULAR | Status: AC
  Filled 2017-05-01: qty 1

## 2017-05-01 MED ORDER — DEXAMETHASONE SODIUM PHOSPHATE 10 MG/ML IJ SOLN
INTRAMUSCULAR | Status: AC
  Filled 2017-05-01: qty 1

## 2017-05-01 MED ORDER — ROCURONIUM BROMIDE 100 MG/10ML IV SOLN: INTRAVENOUS | Status: DC | PRN

## 2017-05-01 MED ORDER — DEXAMETHASONE SODIUM PHOSPHATE 10 MG/ML IJ SOLN
INTRAMUSCULAR | Status: DC | PRN
  Administered 2017-05-01: 5 mg via INTRAVENOUS

## 2017-05-01 MED ORDER — PROPOFOL 10 MG/ML IV BOLUS
INTRAVENOUS | Status: DC | PRN
  Administered 2017-05-01: 70 mg via INTRAVENOUS

## 2017-05-01 MED ORDER — PHENYLEPHRINE HCL 10 MG/ML IJ SOLN
INTRAMUSCULAR | Status: DC | PRN
  Administered 2017-05-01: 100 ug via INTRAVENOUS

## 2017-05-01 MED ORDER — LIDOCAINE HCL (CARDIAC) 20 MG/ML IV SOLN
INTRAVENOUS | Status: DC | PRN
  Administered 2017-05-01: 60 mg via INTRAVENOUS

## 2017-05-01 MED ORDER — FAMOTIDINE 20 MG PO TABS
ORAL_TABLET | ORAL | Status: AC
  Administered 2017-05-01: 20 mg via ORAL
  Filled 2017-05-01: qty 1

## 2017-05-01 MED ORDER — ONDANSETRON HCL 4 MG/2ML IJ SOLN
INTRAMUSCULAR | Status: AC
  Filled 2017-05-01: qty 2

## 2017-05-01 MED ORDER — FENTANYL CITRATE (PF) 100 MCG/2ML IJ SOLN: 25.0000 ug | INTRAMUSCULAR | Status: DC | PRN

## 2017-05-01 NOTE — Anesthesia Procedure Notes (Signed)
Procedure Name: Intubation Date/Time: 05/01/2017 1:42 PM Performed by: Dionne Bucy Pre-anesthesia Checklist: Patient identified, Patient being monitored, Timeout performed, Emergency Drugs available and Suction available Patient Re-evaluated:Patient Re-evaluated prior to inductionOxygen Delivery Method: Circle system utilized Preoxygenation: Pre-oxygenation with 100% oxygen Intubation Type: IV induction Ventilation: Mask ventilation without difficulty Laryngoscope Size: Mac and 4 Grade View: Grade I Tube type: Oral Tube size: 8.0 mm Number of attempts: 1 Airway Equipment and Method: Stylet Placement Confirmation: ETT inserted through vocal cords under direct vision,  positive ETCO2 and breath sounds checked- equal and bilateral Secured at: 23 cm Tube secured with: Tape Dental Injury: Teeth and Oropharynx as per pre-operative assessment

## 2017-05-01 NOTE — Op Note (Signed)
Electromagnetic Navigation Bronchoscopy: Indication: lung mass  Preoperative Diagnosis:lung mass Post Procedure Diagnosis: lung mass Consent: verbal Risks and benefits explained in detail including risk of infection, bleeding, respiratory failure and death.   Hand washing performed prior to starting the procedure.   Type of Anesthesia: see Anesthesiology records .   Procedure Performed:  Virtual Bronchoscopy with Multi-planar Image analysis, 3-D reconstruction of coronal, sagittal and multi-planar images for the purposes of planning real-time bronchoscopy using the iLogic Electromagnetic Navigation Bronchoscopy System (superDimension)..   Description of Procedure: After obtaining informed consent from the patient, the above sedative and anesthetic measures were carried out, flexible fiberoptic bronchoscope was inserted via an oral bite block. Posterior pharynx was clear. The 2 vocal cords were easily traversed after application of local anesthetic.  The virtual camera was then placed into the central portion of the trachea. The trachea itself was inspected.  The main carina, right and left midstem bronchus and all the segmental and subsegmental airways by virtual bronchoscopy were brieftly inspected.  The camera was directed to standard registration points at the following centers: main carina, right upper lobe bronchus, right lower lobe bronchus, right middle lobe bronchus, left upper lobe bronchus, and the left lower lobe bronchus. This data was transferred to the i-Logic ENB system for real-time bronchoscopy.   I was able to navigate to LLL lung mass, and with assistance of fluoroscopy I obtained the following tissue samples  Specimans Obtained:  Transbronchial Fine Needle Aspirations 18G times:4  Transbronchial Forceps Biopsy times:5    Fluoroscopy:  Fluoroscopy was utilized during the course of this procedure to assure that biopsies were taken in a safe manner under fluoroscopic  guidance with no spot films required.   Complications:none  Estimated Blood Loss: none Monitoring:  The patient was monitored with continuous oximetry and received supplemental nasal cannula oxygen throughout the procedure. In addition, serial blood pressure measurements and continuous electrocardiography showed these physiologic parameters to remain tolerable throughout the procedure.   Assessment and Plan/Additional Comments:follow up pathology reports   Corrin Parker, M.D.  [facility]  Medical Director [place] Director [facility] Cardio-Pulmonary Department

## 2017-05-01 NOTE — Transfer of Care (Signed)
Immediate Anesthesia Transfer of Care Note  Patient: Rodney Day  Procedure(s) Performed: Procedure(s): ELECTROMAGNETIC NAVIGATION BRONCHOSCOPY (N/A)  Patient Location: PACU  Anesthesia Type:General  Level of Consciousness: sedated  Airway & Oxygen Therapy: Patient Spontanous Breathing and Patient connected to face mask oxygen  Post-op Assessment: Report given to RN and Post -op Vital signs reviewed and stable  Post vital signs: Reviewed and stable  Last Vitals:  Vitals:   05/01/17 1225 05/01/17 1453  BP: 117/78 127/90  Pulse: 95 80  Resp: 20 18  Temp: 36.6 C 36.1 C    Last Pain:  Vitals:   05/01/17 1225  TempSrc: Oral  PainSc: 0-No pain         Complications: No apparent anesthesia complications

## 2017-05-01 NOTE — Anesthesia Post-op Follow-up Note (Cosign Needed)
Anesthesia QCDR form completed.        

## 2017-05-01 NOTE — Discharge Instructions (Signed)
AMBULATORY SURGERY  DISCHARGE INSTRUCTIONS   1) The drugs that you were given will stay in your system until tomorrow so for the next 24 hours you should not:  A) Drive an automobile B) Make any legal decisions C) Drink any alcoholic beverage   2) You may resume regular meals tomorrow.  Today it is better to start with liquids and gradually work up to solid foods.  You may eat anything you prefer, but it is better to start with liquids, then soup and crackers, and gradually work up to solid foods.   3) Please notify your doctor immediately if you have any unusual bleeding, trouble breathing, redness and pain at the surgery site, drainage, fever, or pain not relieved by medication.    4) Additional Instructions: Follow instructions per Dr Mortimer Fries.  Report any fever, difficulty breathing, or excessive bleeding to your MD.          Please contact your physician with any problems or Same Day Surgery at [phone], Monday through Friday 6 am to 4 pm, or [place] at [facility] number at [phone].

## 2017-05-01 NOTE — Op Note (Signed)
PROCEDURE: ENDOBRONCHIAL ULTRASOUND   PROCEDURE DATE: 05/01/2017  TIME:  NAME:  Jessejames Steelman  DOB:04/01/1946  MRN: [id number] LOC:  ARPO/None    HOSP DAY: @LENGTHOFSTAYDAYS @ CODE STATUS:   Code Status History    Date Active Date Inactive Code Status Order ID Comments User Context   03/29/2016 11:24 AM 03/31/2016  7:25 PM Full Code [id number]  Hubbard Robinson, MD ED    Advance Directive Documentation     Most Recent Value  Type of Advance Directive  Healthcare Power of [place], Living will  Pre-existing out of facility DNR order (yellow form or pink MOST form)  -  "MOST" Form in Place?  -          Indications/Preliminary Diagnosis: adenopathy  Consent: (Place X beside choice/s below)  The benefits, risks and possible complications of the procedure were        explained to:  _x__ patient  _x__ patient's family  ___ other:___________  who verbalized understanding and gave:  ___ verbal  ___ written  _x__ verbal and written  ___ telephone  ___ other:________ consent.      Unable to obtain consent; procedure performed on emergent basis.     Other:       PRESEDATION ASSESSMENT: History and Physical has been performed. Patient meds and allergies have been reviewed. Presedation airway examination has been performed and documented. Baseline vital signs, sedation score, oxygenation status, and cardiac rhythm were reviewed. Patient was deemed to be in satisfactory condition to undergo the procedure.         Insertion Route (Place X beside choice below)   Nasal   Oral  x Endotracheal Tube   Tracheostomy   INTRAPROCEDURE MEDICATIONS: SEE ANESTHESIOLOGY RECORDS   PROCEDURE DETAILS: Timeout performed and correct patient, name, & ID confirmed. Following prep per Pulmonary policy, appropriate sedation was administered.  Airway exam proceeded with findings, technical procedures, and specimen collection as noted below. At the end of exam the scope was withdrawn without  incident. Impression and Plan as noted below.    I was able to vicualize a small LN in the infrahilar region, however, there was extensive blood vessels surrounding the LN. I was only to obtain several samples to to some endobronchial bleeding   TECHNICAL PROCEDURES: (Place X beside choice below)   Procedures  Description    None     Electrocautery     Cryotherapy     Balloon Dilatation     Bronchography     Stent Placement     Therapeutic Aspiration     Laser/Argon Plasma    Brachytherapy Catheter Placement    Foreign Body Removal     SPECIMENS (Sites): (Place X beside choice below)  Specimens Description   No Specimens Obtained     Washings    Lavage    Biopsies   x Fine Needle Aspirates Transbronchial FNA x 3   Brushings    Sputum    FINDINGS: 1x1 cm left hilar adenopathy ESTIMATED BLOOD LOSS: none COMPLICATIONS/RESOLUTION: none       IMPRESSION:POST-PROCEDURE DX:  Follow up pathology reports        Anthonymichael Munday Patricia Pesa, M.D.  [facility]  Medical Director [place] Director [place] Department

## 2017-05-01 NOTE — Interval H&P Note (Signed)
History and Physical Interval Note:  05/01/2017 1:08 PM  Rodney Day  has presented today for surgery, with the diagnosis of lung mass  The various methods of treatment have been discussed with the patient and family. After consideration of risks, benefits and other options for treatment, the patient has consented to  Procedure(s): ELECTROMAGNETIC NAVIGATION BRONCHOSCOPY (N/A) and EBUS as a surgical intervention .  The patient's history has been reviewed, patient examined, no change in status, stable for surgery.  I have reviewed the patient's chart and labs.  Questions were answered to the patient's satisfaction.     Flora Lipps

## 2017-05-01 NOTE — H&P (View-Only) (Signed)
Name: Rodney Day MRN: [id number] DOB: Apr 15, 1946       CONSULTATION DATE: 04/28/2017   REFERRING MD : Gaetana Michaelis  CHIEF COMPLAINT: I have som SOB  SIGNIFICANT EVENTS  Previous DX of bladder cancer, urostomy tube in place    HISTORY OF PRESENT ILLNESS:  71 yo white male with previous h/o Bladder cancer presents  with h/o SOB and abnormal CT chest.  Patient has had chronic SOB and DOE for many years Still smokes 1 ppd for 50 years Associated with fatigue and loss of energy with Weight loss Denies cough, denies fevers, denies wheezing  Patient was getting restaging work up for bladder cancer and CT abd showed Left Lower lobe cavitary nodule and subsequently had CT and PET scan that shows LLL nodule with Left Hilar nodule  I have explained the possibility that this could be primary lung cancer   The Risks and Benefits of the Bronchoscopy with EBUS/ENB were explained to patient/family and I have discussed the risk for acute bleeding, increased chance of infection, increased chance of respiratory failure and cardiac arrest and death. I have also explained to avoid all types of NSAIDs to decrease chance of bleeding, and to avoid food and drinks the midnight prior to procedure.  The procedure consists of a video camera with a light source to be placed and inserted  into the lungs to  look for abnormal tissue and to obtain tissue samples by using needle and biopsy tools.  The patient/family understand the risks and benefits and have agreed to proceed with procedure.   The patient has expressed that he may NOT want to go through any chemo or radiation therapy and he is hesitant at this time to move forward with procedure.  I have explained that we will keep him on schedule and plan for for it unless he tells [place] definitively.     PAST MEDICAL HISTORY :   has a past medical history of Malignant neoplasm of bladder ([place]).  has a past surgical history that includes  Transurethral resection of bladder tumor; Pr Cystectomy,Ileal Conduit/Sigmoid Bladder (05/13/2013); Pr Remove Pelvis Lymph Nodes (05/13/2013); and ERCP (N/A, 04/09/2016). Prior to Admission medications   Medication Sig Start Date End Date Taking? Authorizing Provider  ibuprofen (ADVIL,MOTRIN) 200 MG tablet Take 400 mg by mouth every 8 (eight) hours as needed for mild pain.    Yes [provider]  Multiple Vitamin (MULTIVITAMIN WITH MINERALS) TABS tablet Take 1 tablet by mouth daily.   Yes [provider]  albuterol (PROVENTIL HFA;VENTOLIN HFA) 108 (90 Base) MCG/ACT inhaler Inhale 2 puffs into the lungs every 4 (four) hours as needed for wheezing or shortness of breath. 04/28/17   Flora Lipps, MD  Glycopyrrolate-Formoterol (BEVESPI AEROSPHERE) 9-4.8 MCG/ACT AERO Inhale 2 puffs into the lungs 2 (two) times daily. 04/28/17   Flora Lipps, MD   No Known Allergies  FAMILY HISTORY:  family history includes Bladder Cancer in his father; Dementia in his mother; Diabetes in his mother; Prostate cancer in his father; Stroke in his paternal uncle. SOCIAL HISTORY:  reports that he has been smoking Cigarettes.  He has a 12.75 pack-year smoking history. He has never used smokeless tobacco. He reports that he drinks alcohol. He reports that he does not use drugs.  REVIEW OF SYSTEMS:   Constitutional: + malaise/fatigue   HENT: Negative for hearing loss, ear pain, nosebleeds, congestion, sore throat, neck pain, tinnitus and ear discharge.   Eyes: Negative for blurred vision, double vision, photophobia,  pain, discharge and redness.  Respiratory: -Negative for cough,- hemoptysis, -sputum production, +shortness of breath, -wheezing and -stridor.   Cardiovascular: Negative for chest pain, palpitations, orthopnea, claudication, leg swelling and PND.  Gastrointestinal: Negative for heartburn, nausea, vomiting, abdominal pain, diarrhea, constipation, blood in stool and melena.  Genitourinary: Negative  for dysuria, urgency, frequency, hematuria and flank pain.  Musculoskeletal: Negative for myalgias, back pain, joint pain and falls.  Skin: Negative for itching and rash.  Neurological: Negative for dizziness, tingling, tremors, sensory change, speech change, focal weakness, seizures, loss of consciousness, weakness and headaches.  Endo/Heme/Allergies: Negative for environmental allergies and polydipsia. Does not bruise/bleed easily.   VITAL SIGNS: BP (!) 114/58 (BP Location: Left Arm, Cuff Size: Normal)   Pulse 91   Ht 6' (1.829 m)   Wt 185 lb (83.9 kg)   SpO2 97%   BMI 25.09 kg/m    PHYSICAL EXAMINATION: Physical Examination:   GENERAL:NAD, no fevers, chills, no weakness no fatigue HEAD: Normocephalic, atraumatic.  EYES: Pupils equal, round, reactive to light. Extraocular muscles intact. No scleral icterus.  MOUTH: Moist mucosal membrane. Dentition intact. No abscess noted.  EAR, NOSE, THROAT: Clear without exudates. No external lesions.  NECK: Supple. No thyromegaly. No nodules. No JVD.  PULMONARY:CTA b/l no wheeezes CARDIOVASCULAR: S1 and S2. Regular rate and rhythm. No murmurs, rubs, or gallops. No edema. Pedal pulses 2+ bilaterally.  GASTROINTESTINAL: Soft, nontender, nondistended. No masses. Positive bowel sounds. No hepatosplenomegaly.  MUSCULOSKELETAL: No swelling, clubbing, or edema. Range of motion full in all extremities.  NEUROLOGIC: Cranial nerves II through XII are intact. No gross focal neurological deficits. Sensation intact. Reflexes intact.  SKIN: No ulceration, lesions, rashes, or cyanosis. Skin warm and dry. Turgor intact.  PSYCHIATRIC: Mood, affect within normal limits. The patient is awake, alert and oriented x 3. Insight, judgment intact.  ALL OTHER ROS ARE NEGATIVE     CT chest and PET scan I have Independently reviewed images of  Scans  on 04/28/2017 Interpretation:LLL lung nodule +SUV updatke and Left infrahilar node+SUV uptake  ASSESSMENT /  PLAN: 71 yo white male with SOB/DOE most likely related to COPD with underlying LLL nodule with Left Hilar adenopathy likely will need to consider  primary malignant lung cancer.  1.check PFT's 2.check ONO 3.check 6MWT 4.Start Inhaler therapy based on insurance 5.plan for ENB and EBUS, the risks explained to patient.  Procedure Planned for Friday May 18th afternoon at 130pm unless patient cancels   Patient/Family are satisfied with Plan of action and management. All questions answered  Corrin Parker, M.D.  [facility]  Medical Director [place] Director [facility] Cardio-Pulmonary Department

## 2017-05-01 NOTE — Anesthesia Preprocedure Evaluation (Signed)
Anesthesia Evaluation  Patient identified by MRN, date of birth, ID band Patient awake    Reviewed: Allergy & Precautions, NPO status , Patient's Chart, lab work & pertinent test results, reviewed documented beta blocker date and time   Airway Mallampati: II  TM Distance: >3 FB     Dental  (+) Chipped, Missing, Dental Advisory Given   Pulmonary Current Smoker, former smoker,    Pulmonary exam normal        Cardiovascular negative cardio ROS Normal cardiovascular exam     Neuro/Psych negative neurological ROS  negative psych ROS   GI/Hepatic negative GI ROS, Neg liver ROS,   Endo/Other  negative endocrine ROS  Renal/GU negative Renal ROS Bladder dysfunction      Musculoskeletal negative musculoskeletal ROS (+)   Abdominal Normal abdominal exam  (+)   Peds negative pediatric ROS (+)  Hematology negative hematology ROS (+)   Anesthesia Other Findings Bladder CA, with cyst and ileal conduit. Hx of PVCs. Anemic hb 11.0. GB abscess on antibiotics. Poor dentition. Mass L lower lung  Reproductive/Obstetrics                             Anesthesia Physical  Anesthesia Plan  ASA: III  Anesthesia Plan: General   Post-op Pain Management:    Induction: Intravenous  Airway Management Planned: Oral ETT  Additional Equipment:   Intra-op Plan:   Post-operative Plan: Extubation in OR  Informed Consent: I have reviewed the patients History and Physical, chart, labs and discussed the procedure including the risks, benefits and alternatives for the proposed anesthesia with the patient or authorized representative who has indicated his/her understanding and acceptance.   Dental advisory given  Plan Discussed with: CRNA and Surgeon  Anesthesia Plan Comments:         Anesthesia Quick Evaluation

## 2017-05-01 NOTE — Telephone Encounter (Signed)
Called to f/u with wife and pt and wife states the inhaler was expensive due to pt not meeting his deductible yet for his medication. Informed wife that if it is still too expensive next month to call insurance to see what is covered in place of Bevespi and call the office back. Nothing further needed at this time.

## 2017-05-02 NOTE — Anesthesia Postprocedure Evaluation (Signed)
Anesthesia Post Note  Patient: Rodney Day  Procedure(s) Performed: Procedure(s) (LRB): ELECTROMAGNETIC NAVIGATION BRONCHOSCOPY (N/A)  Patient location during evaluation: PACU Anesthesia Type: General Level of consciousness: awake and alert and oriented Pain management: pain level controlled Vital Signs Assessment: post-procedure vital signs reviewed and stable Respiratory status: spontaneous breathing Cardiovascular status: blood pressure returned to baseline Anesthetic complications: no     Last Vitals:  Vitals:   05/01/17 1556 05/01/17 1624  BP: 121/73 123/73  Pulse: 70 77  Resp: 18 18  Temp: 36.7 C 36.2 C    Last Pain:  Vitals:   05/01/17 1624  TempSrc: Temporal  PainSc:                  Shanina Kepple

## 2017-05-03 ENCOUNTER — Encounter: Payer: Self-pay | Admitting: Internal Medicine

## 2017-05-04 ENCOUNTER — Institutional Professional Consult (permissible substitution): Payer: Medicare Other | Admitting: Internal Medicine

## 2017-05-04 ENCOUNTER — Telehealth: Payer: Self-pay | Admitting: *Deleted

## 2017-05-04 LAB — SURGICAL PATHOLOGY

## 2017-05-04 LAB — CYTOLOGY - NON PAP

## 2017-05-04 NOTE — Telephone Encounter (Signed)
Per VO Dr Grayland Ormond, he can have IVF 1 L NS over an hour when a chair is available. He needs to contact PCP to be seen for other causes. I spoke with [facility] and she stated she will just take him to the ER. He does not have a PCP Dr Grayland Ormond is it. 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 She got frustrated and said she will take him to the ER and that this is ridiculous, he came to see Grayland Ormond when he started feeling like this because he has a h/o cancer. I told her I was sorry

## 2017-05-04 NOTE — Telephone Encounter (Signed)
Gwen called concerned that he just keeps getting worse every day. He will not eat or drink hardly anything and she feels he is dehydrated, he had some IVF with his Bx Friday and he perked up a little then, he is very weak, all he wants to do is sleep all day. She feels he is developing Alzheimers due to memory changes. PLease advise

## 2017-05-05 ENCOUNTER — Inpatient Hospital Stay
Admission: EM | Admit: 2017-05-05 | Discharge: 2017-05-08 | DRG: 689 | Disposition: A | Discharge status: Home / Self Care | Payer: Medicare Other | Attending: Internal Medicine | Admitting: Internal Medicine

## 2017-05-05 ENCOUNTER — Emergency Department: Payer: Medicare Other

## 2017-05-05 ENCOUNTER — Encounter: Payer: Self-pay | Admitting: Emergency Medicine

## 2017-05-05 DIAGNOSIS — N183 Chronic kidney disease, stage 3 (moderate): Secondary | ICD-10-CM | POA: Diagnosis present

## 2017-05-05 DIAGNOSIS — R599 Enlarged lymph nodes, unspecified: Secondary | ICD-10-CM | POA: Diagnosis not present

## 2017-05-05 DIAGNOSIS — R319 Hematuria, unspecified: Secondary | ICD-10-CM

## 2017-05-05 DIAGNOSIS — R630 Anorexia: Secondary | ICD-10-CM

## 2017-05-05 DIAGNOSIS — Z8551 Personal history of malignant neoplasm of bladder: Secondary | ICD-10-CM

## 2017-05-05 DIAGNOSIS — Z716 Tobacco abuse counseling: Secondary | ICD-10-CM | POA: Diagnosis not present

## 2017-05-05 DIAGNOSIS — G4713 Recurrent hypersomnia: Secondary | ICD-10-CM | POA: Diagnosis not present

## 2017-05-05 DIAGNOSIS — R269 Unspecified abnormalities of gait and mobility: Secondary | ICD-10-CM | POA: Diagnosis not present

## 2017-05-05 DIAGNOSIS — F17210 Nicotine dependence, cigarettes, uncomplicated: Secondary | ICD-10-CM | POA: Diagnosis present

## 2017-05-05 DIAGNOSIS — R2689 Other abnormalities of gait and mobility: Secondary | ICD-10-CM | POA: Diagnosis not present

## 2017-05-05 DIAGNOSIS — E860 Dehydration: Secondary | ICD-10-CM | POA: Diagnosis present

## 2017-05-05 DIAGNOSIS — R4182 Altered mental status, unspecified: Secondary | ICD-10-CM | POA: Diagnosis not present

## 2017-05-05 DIAGNOSIS — R531 Weakness: Secondary | ICD-10-CM | POA: Diagnosis not present

## 2017-05-05 DIAGNOSIS — N390 Urinary tract infection, site not specified: Secondary | ICD-10-CM | POA: Diagnosis not present

## 2017-05-05 DIAGNOSIS — Z8042 Family history of malignant neoplasm of prostate: Secondary | ICD-10-CM | POA: Diagnosis not present

## 2017-05-05 DIAGNOSIS — Z9221 Personal history of antineoplastic chemotherapy: Secondary | ICD-10-CM | POA: Diagnosis not present

## 2017-05-05 DIAGNOSIS — Z79899 Other long term (current) drug therapy: Secondary | ICD-10-CM

## 2017-05-05 DIAGNOSIS — G9340 Encephalopathy, unspecified: Secondary | ICD-10-CM | POA: Diagnosis present

## 2017-05-05 DIAGNOSIS — Z8052 Family history of malignant neoplasm of bladder: Secondary | ICD-10-CM | POA: Diagnosis not present

## 2017-05-05 DIAGNOSIS — R918 Other nonspecific abnormal finding of lung field: Secondary | ICD-10-CM | POA: Diagnosis not present

## 2017-05-05 DIAGNOSIS — C679 Malignant neoplasm of bladder, unspecified: Secondary | ICD-10-CM | POA: Diagnosis not present

## 2017-05-05 DIAGNOSIS — N179 Acute kidney failure, unspecified: Secondary | ICD-10-CM | POA: Diagnosis present

## 2017-05-05 DIAGNOSIS — R410 Disorientation, unspecified: Secondary | ICD-10-CM | POA: Diagnosis not present

## 2017-05-05 LAB — COMPREHENSIVE METABOLIC PANEL
ALT: 52 U/L (ref 17–63)
ANION GAP: 8 (ref 5–15)
AST: 30 U/L (ref 15–41)
Albumin: 4.3 g/dL (ref 3.5–5.0)
Alkaline Phosphatase: 71 U/L (ref 38–126)
BUN: 23 mg/dL — ABNORMAL HIGH (ref 6–20)
CHLORIDE: 101 mmol/L (ref 101–111)
CO2: 27 mmol/L (ref 22–32)
CREATININE: 1.6 mg/dL — AB (ref 0.61–1.24)
Calcium: 10.1 mg/dL (ref 8.9–10.3)
GFR, EST AFRICAN AMERICAN: 49 mL/min — AB (ref >60)
GFR, EST NON AFRICAN AMERICAN: 42 mL/min — AB (ref >60)
Glucose, Bld: 114 mg/dL — ABNORMAL HIGH (ref 65–99)
POTASSIUM: 4.5 mmol/L (ref 3.5–5.1)
SODIUM: 136 mmol/L (ref 135–145)
Total Bilirubin: 0.7 mg/dL (ref 0.3–1.2)
Total Protein: 8 g/dL (ref 6.5–8.1)

## 2017-05-05 LAB — CBC WITH DIFFERENTIAL/PLATELET
Basophils Absolute: 0.1 10*3/uL (ref 0–0.1)
Basophils Relative: 1 %
Eosinophils Absolute: 0.1 10*3/uL (ref 0–0.7)
Eosinophils Relative: 1 %
HCT: 39.8 % — ABNORMAL LOW (ref 40.0–52.0)
HEMOGLOBIN: 13.8 g/dL (ref 13.0–18.0)
LYMPHS ABS: 1.5 10*3/uL (ref 1.0–3.6)
LYMPHS PCT: 23 %
MCH: 30.9 pg (ref 26.0–34.0)
MCHC: 34.6 g/dL (ref 32.0–36.0)
MCV: 89.3 fL (ref 80.0–100.0)
MONOS PCT: 10 %
Monocytes Absolute: 0.6 10*3/uL (ref 0.2–1.0)
NEUTROS PCT: 65 %
Neutro Abs: 4.3 10*3/uL (ref 1.4–6.5)
Platelets: 285 10*3/uL (ref 150–440)
RBC: 4.45 MIL/uL (ref 4.40–5.90)
RDW: 14.1 % (ref 11.5–14.5)
WBC: 6.6 10*3/uL (ref 3.8–10.6)

## 2017-05-05 LAB — URINALYSIS, COMPLETE (UACMP) WITH MICROSCOPIC
BACTERIA UA: NONE SEEN
BILIRUBIN URINE: NEGATIVE
GLUCOSE, UA: NEGATIVE mg/dL
KETONES UR: NEGATIVE mg/dL
NITRITE: NEGATIVE
PH: 7 (ref 5.0–8.0)
Protein, ur: 100 mg/dL — AB
SPECIFIC GRAVITY, URINE: 1.017 (ref 1.005–1.030)

## 2017-05-05 LAB — TROPONIN I: Troponin I: <0.03 ng/mL (ref <0.03)

## 2017-05-05 LAB — TSH: TSH: 1.582 u[IU]/mL (ref 0.350–4.500)

## 2017-05-05 MED ORDER — ACETAMINOPHEN 325 MG PO TABS
650.0000 mg | ORAL_TABLET | Freq: Four times a day (QID) | ORAL | Status: DC | PRN
Start: 2017-05-05 — End: 2017-05-08
  Filled 2017-05-05: qty 2

## 2017-05-05 MED ORDER — SODIUM CHLORIDE 0.9 % IV SOLN
1000.0000 mL | Freq: Once | INTRAVENOUS | Status: AC
Start: 2017-05-05 — End: 2017-05-05
  Administered 2017-05-05: 1000 mL via INTRAVENOUS

## 2017-05-05 MED ORDER — SODIUM CHLORIDE 0.9 % IV SOLN
INTRAVENOUS | Status: DC
  Administered 2017-05-05 – 2017-05-07 (×6): via INTRAVENOUS

## 2017-05-05 MED ORDER — ALBUTEROL SULFATE (2.5 MG/3ML) 0.083% IN NEBU: 2.5000 mg | INHALATION_SOLUTION | RESPIRATORY_TRACT | Status: DC | PRN

## 2017-05-05 MED ORDER — ONDANSETRON HCL 4 MG/2ML IJ SOLN: 4.0000 mg | Freq: Four times a day (QID) | INTRAMUSCULAR | Status: DC | PRN

## 2017-05-05 MED ORDER — DEXTROSE 5 % IV SOLN
1.0000 g | INTRAVENOUS | Status: DC
  Administered 2017-05-06 – 2017-05-08 (×3): 1 g via INTRAVENOUS
  Filled 2017-05-05 (×4): qty 10

## 2017-05-05 MED ORDER — ENOXAPARIN SODIUM 40 MG/0.4ML ~~LOC~~ SOLN
40.0000 mg | SUBCUTANEOUS | Status: DC
  Administered 2017-05-05 – 2017-05-07 (×3): 40 mg via SUBCUTANEOUS
  Filled 2017-05-05 (×3): qty 0.4

## 2017-05-05 MED ORDER — ADULT MULTIVITAMIN W/MINERALS CH
1.0000 | ORAL_TABLET | Freq: Every day | ORAL | Status: DC
  Administered 2017-05-06 – 2017-05-08 (×3): 1 via ORAL
  Filled 2017-05-05 (×2): qty 1

## 2017-05-05 MED ORDER — ONDANSETRON HCL 4 MG PO TABS: 4.0000 mg | ORAL_TABLET | Freq: Four times a day (QID) | ORAL | Status: DC | PRN

## 2017-05-05 MED ORDER — DEXTROSE 5 % IV SOLN: 1.0000 g | INTRAVENOUS | Status: DC

## 2017-05-05 MED ORDER — CEFTRIAXONE SODIUM IN DEXTROSE 20 MG/ML IV SOLN
1.0000 g | Freq: Once | INTRAVENOUS | Status: AC
  Administered 2017-05-05: 1 g via INTRAVENOUS
  Filled 2017-05-05 (×2): qty 50

## 2017-05-05 MED ORDER — ACETAMINOPHEN 650 MG RE SUPP: 650.0000 mg | Freq: Four times a day (QID) | RECTAL | Status: DC | PRN

## 2017-05-05 NOTE — Evaluation (Signed)
Physical Therapy Evaluation Patient Details Name: Rodney Day MRN: [id number] DOB: 11/22/1946 Today's Date: 05/05/2017   History of Present Illness  Pt admitted for complaints of dehydration. HIstory includies bladder cancer s/p chemo, urostomy, and bladder resection along with prostatectomy. Pt now diagnosed with UTI.  Clinical Impression  Pt is a pleasant 71 year old male who was admitted for UTI. Pt demonstrates all bed mobility/transfers/ambulation at baseline level. Pt is very close to baseline level at this time. Pt is ambulatory with ability to push his own IV pole, no unsteadiness noted. Pt reports slight weakness, gave HEP for continued performance. Encouraged eating/drinking to assist with improved strength. Pt does not require any further PT needs at this time. Pt will be dc in house and does not require follow up. RN aware. Will dc current orders.      Follow Up Recommendations No PT follow up    Equipment Recommendations  None recommended by PT    Recommendations for Other Services       Precautions / Restrictions Precautions Precautions: Fall Restrictions Weight Bearing Restrictions: No      Mobility  Bed Mobility Overal bed mobility: Independent             General bed mobility comments: safe technique performed. Once seated at EOB, pt able to don shoes without assistance  Transfers Overall transfer level: Independent Equipment used: None             General transfer comment: safe technique performed  Ambulation/Gait Ambulation/Gait assistance: Min guard Ambulation Distance (Feet): 200 Feet Assistive device:  (IV pole) Gait Pattern/deviations: Step-through pattern     General Gait Details: ambulated around hallway with safe technique pushing IV pole. No LOB noted, however pt fatigues with increased distance. Good speed noted  Stairs            Wheelchair Mobility    Modified Rankin (Stroke Patients Only)       Balance  Overall balance assessment: Independent                                           Pertinent Vitals/Pain Pain Assessment: No/denies pain    Home Living Family/patient expects to be discharged to:: Private residence Living Arrangements: Spouse/significant other;Children (adult son) Available Help at Discharge: Family Type of Home: House Home Access: Stairs to enter;Ramped entrance Entrance Stairs-Rails: Can reach both (however can access ramp if needed) Entrance Stairs-Number of Steps: 5 Home Layout: Able to live on main level with bedroom/bathroom;Two level Home Equipment: Walker - 2 wheels;Cane - single point;Bedside commode      Prior Function Level of Independence: Independent         Comments: pt reports no issues, however 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 Reports no falls at this time     Hand Dominance        Extremity/Trunk Assessment   Upper Extremity Assessment Upper Extremity Assessment: Overall WFL for tasks assessed    Lower Extremity Assessment Lower Extremity Assessment: Generalized weakness (B LE grossly 4+/5)       Communication   Communication: No difficulties  Cognition Arousal/Alertness: Awake/alert Behavior During Therapy: WFL for tasks assessed/performed Overall Cognitive Status: Within Functional Limits for tasks assessed  General Comments      Exercises Other Exercises Other Exercises: Educated on ther-ex to perform during hospitalization to prevent deconditioning. Ther-ex included B LE SLR, heel slides, and hip abd/add. All ther-ex performed x 10 reps with supervision and cues for correct technique   Assessment/Plan    PT Assessment Patent does not need any further PT services  PT Problem List Decreased strength;Decreased activity tolerance       PT Treatment Interventions      PT Goals (Current goals can be found in the Care Plan section)  Acute Rehab  PT Goals Patient Stated Goal: to go home PT Goal Formulation: All assessment and education complete, DC therapy Time For Goal Achievement: 05/05/17 Potential to Achieve Goals: Good    Frequency     Barriers to discharge        Co-evaluation               AM-PAC PT "6 Clicks" Daily Activity  Outcome Measure Difficulty turning over in bed (including adjusting bedclothes, sheets and blankets)?: None Difficulty moving from lying on back to sitting on the side of the bed? : None Difficulty sitting down on and standing up from a chair with arms (e.g., wheelchair, bedside commode, etc,.)?: None Help needed moving to and from a bed to chair (including a wheelchair)?: None Help needed walking in hospital room?: None Help needed climbing 3-5 steps with a railing? : None 6 Click Score: 24    End of Session Equipment Utilized During Treatment: Gait belt Activity Tolerance: Patient tolerated treatment well Patient left: in bed;with bed alarm set;with family/visitor present Nurse Communication: Mobility status PT Visit Diagnosis: Muscle weakness (generalized) (M62.81)    Time: 5956-3875 PT Time Calculation (min) (ACUTE ONLY): 17 min   Charges:   PT Evaluation $PT Eval Low Complexity: 1 Procedure PT Treatments $Therapeutic Exercise: 8-22 mins   PT G Codes:        Greggory Stallion, PT, DPT [phone]   Nahomi Hegner 05/05/2017, 4:15 PM

## 2017-05-05 NOTE — ED Triage Notes (Signed)
Pt brought here by wife, states he has bladder cancer, new spot on lungs, has not been eating or drinking the past few days, only sleeps, wife states he is dehydrated and has no energy. NAD. Appears pale.

## 2017-05-05 NOTE — ED Provider Notes (Signed)
[facility] Emergency Department Provider Note       Time seen: ----------------------------------------- 8:42 AM on 05/05/2017 -----------------------------------------     I have reviewed the triage vital signs and the nursing notes.   HISTORY   Chief Complaint Dehydration    HPI Rodney Day is a 71 y.o. male who presents to the ED for possible dehydration. Patient according to his wife has been diagnosed with bladder cancer with possible lung cancer with recent biopsy. He has not been eating and drinking the past few days and only sleeps. The wife states he is dehydrated and has no energy. Patient denies any focal complaints other than weakness. Symptom onset was several days.   Past Medical History:  Diagnosis Date  . Malignant neoplasm of bladder [facility])     Patient Active Problem List   Diagnosis Date Noted  . Adenopathy   . Lung mass 04/16/2017  . Cholelithiasis with cholecystitis or obstruction 03/29/2016  . Bladder cancer ([place]) 05/25/2015    Past Surgical History:  Procedure Laterality Date  . ELECTROMAGNETIC NAVIGATION BROCHOSCOPY N/A 05/01/2017   Procedure: ELECTROMAGNETIC NAVIGATION BRONCHOSCOPY;  Surgeon: Flora Lipps, MD;  Location: [facility] ORS;  Service: Cardiopulmonary;  Laterality: N/A;  . ERCP N/A 04/09/2016   Procedure: ENDOSCOPIC RETROGRADE CHOLANGIOPANCREATOGRAPHY (ERCP);  Surgeon: Hulen Luster, MD;  Location: [facility] ENDOSCOPY;  Service: Gastroenterology;  Laterality: N/A;  . Pr Cystectomy,Ileal Conduit/Sigmoid Bladder  05/13/2013   CYSTECTOMY, COMPLETE, WITH URETEROILEAL CONDUIT OR SIGMOID BLADDER, INCLUDING INTESTINE ANASTOMOSIS; Surgeon: Ronnald Nian, MD  . Pr Remove Pelvis Lymph Nodes  05/13/2013   Procedure: PELVIC LYMPHADENECTOMY W/EXT ILIAC (SEPART PROC); Surgeon: Ronnald Nian, MD  . TRANSURETHRAL RESECTION OF BLADDER TUMOR      Allergies Patient has no known allergies.  Social History Social History   Substance Use Topics  . Smoking status: Current Every Day Smoker    Packs/day: 0.25    Years: 51.00    Types: Cigarettes  . Smokeless tobacco: Never Used     Comment: 2-3 cigarettes/day  . Alcohol use 0.0 oz/week     Comment: 1 beer every 2 weeks    Review of Systems Constitutional: Negative for fever. Eyes: Negative for vision changes ENT:  Negative for congestion, sore throat Cardiovascular: Negative for chest pain. Respiratory: Negative for shortness of breath. Gastrointestinal: Negative for abdominal pain, vomiting and diarrhea. Genitourinary: Negative for dysuria. Musculoskeletal: Negative for back pain. Skin: Negative for rash. Neurological: Positive for generalized weakness  All systems negative/normal/unremarkable except as stated in the HPI  ____________________________________________   PHYSICAL EXAM:  VITAL SIGNS: ED Triage Vitals  Enc Vitals Group     BP 05/05/17 0823 124/82     Pulse Rate 05/05/17 0823 91     Resp 05/05/17 0823 18     Temp 05/05/17 0823 97.5 F (36.4 C)     Temp Source 05/05/17 0823 Oral     SpO2 05/05/17 0823 99 %     Weight 05/05/17 0824 185 lb (83.9 kg)     Height 05/05/17 0824 6' (1.829 m)     Head Circumference --      Peak Flow --      Pain Score --      Pain Loc --      Pain Edu? --      Excl. in [place]? --     Constitutional: Alert and oriented. Drowsy, no distress Eyes: Conjunctivae are normal. Normal extraocular movements. ENT   Head: Normocephalic and atraumatic.  Nose: No congestion/rhinnorhea.   Mouth/Throat: Mucous membranes are dry   Neck: No stridor. Cardiovascular: Normal rate, regular rhythm. No murmurs, rubs, or gallops. Respiratory: Normal respiratory effort without tachypnea nor retractions. Breath sounds are clear and equal bilaterally. No wheezes/rales/rhonchi. Gastrointestinal: Soft and nontender. Normal bowel sounds Musculoskeletal: Nontender with normal range of motion in extremities. No  lower extremity tenderness nor edema. Neurologic:  Normal speech and language. No gross focal neurologic deficits are appreciated.  Skin:  Skin is warm, dry and intact. No rash noted. Psychiatric: Mood and affect are normal. Speech and behavior are normal.  ____________________________________________  EKG: Interpreted by me. Sinus rhythm rate of 63 bpm, normal PR interval, normal QRS, normal QT.  ____________________________________________  ED COURSE:  Pertinent labs & imaging results that were available during my care of the patient were reviewed by me and considered in my medical decision making (see chart for details). Patient presents for generalized weakness and possible dehydration, we will assess with labs and imaging as indicated.   Procedures ____________________________________________   LABS (pertinent positives/negatives)  Labs Reviewed  COMPREHENSIVE METABOLIC PANEL - Abnormal; Notable for the following:       Result Value   Glucose, Bld 114 (*)    BUN 23 (*)    Creatinine, Ser 1.60 (*)    GFR calc non Af Amer 42 (*)    GFR calc Af Amer 49 (*)    All other components within normal limits  URINALYSIS, COMPLETE (UACMP) WITH MICROSCOPIC - Abnormal; Notable for the following:    Color, Urine YELLOW (*)    APPearance CLOUDY (*)    Hgb urine dipstick SMALL (*)    Protein, ur 100 (*)    Leukocytes, UA SMALL (*)    Squamous Epithelial / LPF 0-5 (*)    All other components within normal limits  CBC WITH DIFFERENTIAL/PLATELET - Abnormal; Notable for the following:    HCT 39.8 (*)    All other components within normal limits  URINE CULTURE  TROPONIN I    RADIOLOGY Images were viewed by me  Chest x-ray, CT head IMPRESSION: No intracranial hemorrhage or CT evidence of large acute infarct.  Unenhanced exam without evidence of intracranial metastatic disease.  Global mild atrophy. IMPRESSION: 1. No radiographic evidence of acute cardiopulmonary disease. 2.  Aortic atherosclerosis. ____________________________________________  FINAL ASSESSMENT AND PLAN  Weakness, UTI, mild Dehydration  Plan: Patient's labs and imaging were dictated above. Patient had presented for lethargy and poor appetite. There is likely evidence of UTI although this was obtained from urostomy. We will send a urine culture and give IV fluids. He would likely benefit from hospital observation.   Earleen Newport, MD   Note: This note was generated in part or whole with voice recognition software. Voice recognition is usually quite accurate but there are transcription errors that can and very often do occur. I apologize for any typographical errors that were not detected and corrected.     Earleen Newport, MD 05/05/17 1018

## 2017-05-05 NOTE — ED Notes (Signed)
Dr. Bobbye Charleston in room to assess patient.  Will continue to monitor.

## 2017-05-05 NOTE — Progress Notes (Signed)
Pharmacy Antibiotic Note  Rodney Day is a 71 y.o. male admitted on 05/05/2017 with UTI.  Pharmacy has been consulted for Ceftriaxone dosing.  History of Bladder cancer status post chemotherapy and major surgery including urostomy, bladder resection and prostatectomy.  Plan: Patient received Ceftriaxone 1 gram IV x 1in ER. Will continue with Ceftriaxone 1 gram IV Q24h.    Height: 6' (182.9 cm) Weight: 185 lb (83.9 kg) IBW/kg (Calculated) : 77.6  Temp (24hrs), Avg:97.5 F (36.4 C), Min:97.5 F (36.4 C), Max:97.5 F (36.4 C)   Recent Labs Lab 05/05/17 0833 05/05/17 0923  WBC  --  6.6  CREATININE 1.60*  --     Estimated Creatinine Clearance: 47.8 mL/min (A) (by C-G formula based on SCr of 1.6 mg/dL (H)).    No Known Allergies  Antimicrobials this admission: CTX 5/22 >>       >>    Dose adjustments this admission:    Microbiology results:   BCx:   5/22 UCx:      Sputum:      MRSA PCR:    Thank you for allowing pharmacy to be a part of this patient's care.  Stokely Jeancharles A 05/05/2017 11:35 AM

## 2017-05-05 NOTE — H&P (Addendum)
[facility] [place] NAME: Rodney Day    MR#:  [id number]  DATE OF BIRTH:  1946-05-14  DATE OF ADMISSION:  05/05/2017  PRIMARY CARE PHYSICIAN: Lloyd Huger, MD   REQUESTING/REFERRING PHYSICIAN: Dr Cephas Darby  CHIEF COMPLAINT:   Chief Complaint  Patient presents with  . Dehydration    HISTORY OF PRESENT ILLNESS:  Rodney Day  is a 71 y.o. male with a known history of Bladder cancer status post chemotherapy and major surgery including urostomy, bladder resection and prostatectomy. He presents feeling tired and run down and sleeping a lot and no energy. He's not been eating a long time. He has a poor appetite. Wife states he's also had confusion. No fever chills or sweats. Positive for weight loss. Positive for weakness.  In the ER, the patient does answer questions appropriately. Follows commands. He was found to have a positive urine analysis from the urostomy. Hospitalist services were contacted for admission.  PAST MEDICAL HISTORY:   Past Medical History:  Diagnosis Date  . Malignant neoplasm of bladder ([place])     PAST SURGICAL HISTORY:   Past Surgical History:  Procedure Laterality Date  . ELECTROMAGNETIC NAVIGATION BROCHOSCOPY N/A 05/01/2017   Procedure: ELECTROMAGNETIC NAVIGATION BRONCHOSCOPY;  Surgeon: Flora Lipps, MD;  Location: [facility] ORS;  Service: Cardiopulmonary;  Laterality: N/A;  . ERCP N/A 04/09/2016   Procedure: ENDOSCOPIC RETROGRADE CHOLANGIOPANCREATOGRAPHY (ERCP);  Surgeon: Hulen Luster, MD;  Location: [facility] ENDOSCOPY;  Service: Gastroenterology;  Laterality: N/A;  . Pr Cystectomy,Ileal Conduit/Sigmoid Bladder  05/13/2013   CYSTECTOMY, COMPLETE, WITH URETEROILEAL CONDUIT OR SIGMOID BLADDER, INCLUDING INTESTINE ANASTOMOSIS; Surgeon: Ronnald Nian, MD  . Pr Remove Pelvis Lymph Nodes  05/13/2013   Procedure: PELVIC LYMPHADENECTOMY W/EXT ILIAC (SEPART PROC); Surgeon: Ronnald Nian, MD  . TRANSURETHRAL  RESECTION OF BLADDER TUMOR      SOCIAL HISTORY:   Social History  Substance Use Topics  . Smoking status: Current Every Day Smoker    Packs/day: 0.25    Years: 51.00    Types: Cigarettes  . Smokeless tobacco: Never Used     Comment: 2-3 cigarettes/day  . Alcohol use 0.0 oz/week     Comment: 1 beer every 2 weeks    FAMILY HISTORY:   Family History  Problem Relation Age of Onset  . Bladder Cancer Father   . Prostate cancer Father   . COPD Father   . Stroke Paternal Uncle   . Dementia Mother   . Diabetes Mother     DRUG ALLERGIES:  No Known Allergies  REVIEW OF SYSTEMS:  CONSTITUTIONAL: No fever. Positive fatigue and weakness.  EYES: No blurred or double vision.  EARS, NOSE, AND THROAT: No tinnitus or ear pain. No sore throat. Decreased hearing. Occasional runny nose. RESPIRATORY: No cough. Positive for shortness of breath, no wheezing or hemoptysis.  CARDIOVASCULAR: No chest pain, orthopnea, edema.  GASTROINTESTINAL: No nausea, vomiting, diarrhea or abdominal pain. No blood in bowel movements GENITOURINARY: No dysuria, hematuria.  ENDOCRINE: No polyuria, nocturia,  HEMATOLOGY: No anemia, easy bruising or bleeding SKIN: No rash or lesion. MUSCULOSKELETAL: No joint pain or arthritis.   NEUROLOGIC: No tingling, numbness. Positive for unsteady gait PSYCHIATRY: No anxiety or depression.   MEDICATIONS AT HOME:   Prior to Admission medications   Medication Sig Start Date End Date Taking? Authorizing Provider  albuterol (PROVENTIL HFA;VENTOLIN HFA) 108 (90 Base) MCG/ACT inhaler Inhale 2 puffs into the lungs every 4 (four) hours as needed for  wheezing or shortness of breath. 04/28/17  Yes Kasa, Maretta Bees, MD  Glycopyrrolate-Formoterol (BEVESPI AEROSPHERE) 9-4.8 MCG/ACT AERO Inhale 2 puffs into the lungs 2 (two) times daily. 04/28/17  Yes Flora Lipps, MD  ibuprofen (ADVIL,MOTRIN) 200 MG tablet Take 400 mg by mouth every 8 (eight) hours as needed for mild pain.    Yes [provider]  Multiple Vitamin (MULTIVITAMIN WITH MINERALS) TABS tablet Take 1 tablet by mouth daily.   Yes [provider]      VITAL SIGNS:  Blood pressure 139/77, pulse (!) 59, temperature 97.5 F (36.4 C), temperature source Oral, resp. rate 16, height 6' (1.829 m), weight 83.9 kg (185 lb), SpO2 98 %.  PHYSICAL EXAMINATION:  GENERAL:  71 y.o.-year-old patient lying in the bed with no acute distress.  EYES: Pupils equal, round, reactive to light and accommodation. No scleral icterus. Extraocular muscles intact.  HEENT: Head atraumatic, normocephalic. Oropharynx and nasopharynx clear.  NECK:  Supple, no jugular venous distention. No thyroid enlargement, no tenderness.  LUNGS: Normal breath sounds bilaterally, no wheezing, rales,rhonchi or crepitation. No use of accessory muscles of respiration.  CARDIOVASCULAR: S1, S2 normal. No murmurs, rubs, or gallops.  ABDOMEN: Soft, nontender, nondistended. Bowel sounds present. No organomegaly or mass.  EXTREMITIES: No pedal edema, cyanosis, or clubbing.  NEUROLOGIC: Cranial nerves II through XII are intact. Muscle strength 5/5 in all extremities. Sensation intact. Gait not checked.  PSYCHIATRIC: The patient is alert and oriented x 3.  SKIN: No rash, lesion, or ulcer.   LABORATORY PANEL:   CBC  Recent Labs Lab 05/05/17 0923  WBC 6.6  HGB 13.8  HCT 39.8*  PLT 285   ------------------------------------------------------------------------------------------------------------------  Chemistries   Recent Labs Lab 05/05/17 0833  NA 136  K 4.5  CL 101  CO2 27  GLUCOSE 114*  BUN 23*  CREATININE 1.60*  CALCIUM 10.1  AST 30  ALT 52  ALKPHOS 71  BILITOT 0.7   ------------------------------------------------------------------------------------------------------------------  Cardiac Enzymes  Recent Labs Lab 05/05/17 0833  TROPONINI <0.03    ------------------------------------------------------------------------------------------------------------------  RADIOLOGY:  Dg Chest 2 View  Result Date: 05/05/2017 CLINICAL DATA:  71 year old male with history of bladder cancer. Dehydration and lack of energy. EXAM: CHEST  2 VIEW COMPARISON:  No prior chest x-ray.  Chest CT 04/23/2017. FINDINGS: Lung volumes are normal. No consolidative airspace disease. No pleural effusions. No pneumothorax. No definite suspicious appearing pulmonary nodule or mass noted. Pulmonary vasculature and the cardiomediastinal silhouette are within normal limits. Atherosclerosis in the thoracic aorta. IMPRESSION: 1. No radiographic evidence of acute cardiopulmonary disease. 2. Aortic atherosclerosis. Electronically Signed   By: Vinnie Langton M.D.   On: 05/05/2017 09:21   Ct Head Wo Contrast  Result Date: 05/05/2017 CLINICAL DATA:  71 year old male with bladder cancer and new lung lesion presenting with altered mental status. Initial encounter. EXAM: CT HEAD WITHOUT CONTRAST TECHNIQUE: Contiguous axial images were obtained from the base of the skull through the vertex without intravenous contrast. COMPARISON:  None. FINDINGS: Brain: No intracranial hemorrhage or CT evidence of large acute infarct. No vasogenic edema or mass effect to suggest presence of intracranial metastatic disease. Contrast not administered. Global mild atrophy without hydrocephalus. Vascular: Vascular calcifications. Skull: No destructive lesion. Sinuses/Orbits: No acute orbital abnormality. Visualized paranasal sinuses are clear. Other: Visualized mastoid air cells and middle ear cavities are clear. IMPRESSION: No intracranial hemorrhage or CT evidence of large acute infarct. Unenhanced exam without evidence of intracranial metastatic disease. Global mild atrophy. Electronically Signed   By: Remo Lipps  Jeannine Kitten M.D.   On: 05/05/2017 09:28    EKG:   Normal sinus rhythm 63 bpm him a poor R wave  progression  IMPRESSION AND PLAN:   1.  Complicated urinary tract infection with urostomy. I would advise keeping the patient until urine cultures are back. Started on Rocephin in the emergency room. I will continue this. Urine culture ordered. IV fluid hydration. 2. History of bladder cancer. Recent PET scan was positive for area in the lung. This was recently biopsied with a bronchoscopy. Pathology results were negative for malignancy. This will have to be watched closely. 3. Weakness, acute encephalopathy. Watch closely with treatment of urinary tract infection and hydration. Hopefully this will improve. Can consider an MRI of the brain if no further improvement. Physical therapy evaluation. Wife mentioned the possibility of dementia. With urinary tract infection I will not do testing for this except for blood work including RPR, B12 and TSH. 4. Tobacco abuse smoking is his agent counseling done 4 minutes by me. Nicotine patch not needed since he only smokes a few cigarettes a day. 5. Shortness of breath. Pulmonary recently started on inhalers. 6. Poor appetite and weight loss. I advised the patient that he must eat and drink and stay hydrated. 7. Chronic kidney disease stage III. Monitor with IV fluid hydration.  All the records are reviewed and case discussed with ED provider. Management plans discussed with the patient, family and they are in agreement.  CODE STATUS: Full code  TOTAL TIME TAKING CARE OF THIS PATIENT: 50 minutes.    Loletha Grayer M.D on 05/05/2017 at 10:53 AM  Between 7am to 6pm - Pager - [phone]  After 6pm call admission pager [phone]  [facility]  [phone]  CC: Primary care physician; Lloyd Huger, MD

## 2017-05-06 ENCOUNTER — Ambulatory Visit: Payer: Medicare Other

## 2017-05-06 DIAGNOSIS — F17210 Nicotine dependence, cigarettes, uncomplicated: Secondary | ICD-10-CM

## 2017-05-06 DIAGNOSIS — Z8052 Family history of malignant neoplasm of bladder: Secondary | ICD-10-CM

## 2017-05-06 DIAGNOSIS — R599 Enlarged lymph nodes, unspecified: Secondary | ICD-10-CM

## 2017-05-06 DIAGNOSIS — Z8042 Family history of malignant neoplasm of prostate: Secondary | ICD-10-CM

## 2017-05-06 DIAGNOSIS — R410 Disorientation, unspecified: Secondary | ICD-10-CM

## 2017-05-06 DIAGNOSIS — N390 Urinary tract infection, site not specified: Principal | ICD-10-CM

## 2017-05-06 DIAGNOSIS — R4182 Altered mental status, unspecified: Secondary | ICD-10-CM

## 2017-05-06 DIAGNOSIS — Z8551 Personal history of malignant neoplasm of bladder: Secondary | ICD-10-CM

## 2017-05-06 DIAGNOSIS — R918 Other nonspecific abnormal finding of lung field: Secondary | ICD-10-CM

## 2017-05-06 LAB — CBC
HEMATOCRIT: 37.4 % — AB (ref 40.0–52.0)
Hemoglobin: 12.9 g/dL — ABNORMAL LOW (ref 13.0–18.0)
MCH: 30.5 pg (ref 26.0–34.0)
MCHC: 34.4 g/dL (ref 32.0–36.0)
MCV: 88.6 fL (ref 80.0–100.0)
Platelets: 262 10*3/uL (ref 150–440)
RBC: 4.22 MIL/uL — AB (ref 4.40–5.90)
RDW: 14.3 % (ref 11.5–14.5)
WBC: 6.5 10*3/uL (ref 3.8–10.6)

## 2017-05-06 LAB — RPR: RPR Ser Ql: NONREACTIVE

## 2017-05-06 LAB — BASIC METABOLIC PANEL
Anion gap: 7 (ref 5–15)
BUN: 21 mg/dL — AB (ref 6–20)
CHLORIDE: 105 mmol/L (ref 101–111)
CO2: 24 mmol/L (ref 22–32)
Calcium: 8.8 mg/dL — ABNORMAL LOW (ref 8.9–10.3)
Creatinine, Ser: 1.32 mg/dL — ABNORMAL HIGH (ref 0.61–1.24)
GFR calc Af Amer: >60 mL/min (ref >60)
GFR calc non Af Amer: 53 mL/min — ABNORMAL LOW (ref >60)
Glucose, Bld: 103 mg/dL — ABNORMAL HIGH (ref 65–99)
POTASSIUM: 3.9 mmol/L (ref 3.5–5.1)
SODIUM: 136 mmol/L (ref 135–145)

## 2017-05-06 LAB — VITAMIN B12: Vitamin B-12: 487 pg/mL (ref 180–914)

## 2017-05-06 LAB — URINE CULTURE: SPECIAL REQUESTS: NORMAL

## 2017-05-06 MED ORDER — IPRATROPIUM-ALBUTEROL 0.5-2.5 (3) MG/3ML IN SOLN
3.0000 mL | Freq: Four times a day (QID) | RESPIRATORY_TRACT | Status: DC
  Administered 2017-05-06 – 2017-05-07 (×5): 3 mL via RESPIRATORY_TRACT
  Filled 2017-05-06 (×5): qty 3

## 2017-05-06 NOTE — Progress Notes (Signed)
Pharmacy Antibiotic Note  Rodney Day is a 71 y.o. male admitted on 05/05/2017 with UTI.  Pharmacy has been consulted for Ceftriaxone dosing.  History of Bladder cancer status post chemotherapy and major surgery including urostomy, bladder resection and prostatectomy.  Plan: Continue ceftriaxone 1g IV Q24hr.     Height: 6' (182.9 cm) Weight: 185 lb (83.9 kg) IBW/kg (Calculated) : 77.6  Temp (24hrs), Avg:97.8 F (36.6 C), Min:97.5 F (36.4 C), Max:98.1 F (36.7 C)   Recent Labs Lab 05/05/17 0833 05/05/17 0923 05/06/17 0448  WBC  --  6.6 6.5  CREATININE 1.60*  --  1.32*    Estimated Creatinine Clearance: 58 mL/min (A) (by C-G formula based on SCr of 1.32 mg/dL (H)).    No Known Allergies  Antimicrobials this admission: CTX 5/22 >>     Dose adjustments this admission:  N/A  Microbiology results: 5/22 UCx: Pending   Thank you for allowing pharmacy to be a part of this patient's care.  Simpson,Michael L 05/06/2017 12:38 PM

## 2017-05-06 NOTE — Consult Note (Signed)
[facility] CONSULT NOTE  Patient Care Team: Lloyd Huger, MD as PCP - General (Oncology)  CHIEF COMPLAINTS/PURPOSE OF CONSULTATION: Lung mass/ status post biopsy  HISTORY OF PRESENTING ILLNESS:  Rodney Day 71 y.o.  male  prior history of bladder cancer status post cystectomy - is currently admitted to the hospital for mental status changes and UTI.   Patient is known to the oncology Department /follows up with Dr. Grayland Ormond - regarding approximately 3 cm left lower lobe cavitating lung mass; also left hilar adenopathy. Patient's case was reviewed with the tumor conference- and finally underwent ENB bronchoscopy by Dr. Jenell Milliner- with a biopsy showed no evidence of malignancy; inflammatory cells only.   Patient is currently admitted to the hospital for UTI-currently on antibiotics. Patient is CT scan of the brain noncontrast was negative for any acute process.  As per the wife patient has had increasing confusion/memory loss over the last 8 weeks or so progressively getting worse. As per the wife this has not significantly improved since admission the hospital. She is very concerned. Patient has been able to walk with physical therapy although noted to have generalized weakness. No focal deficits noted.   ROS: A complete 10 point review of system is done which is negative except mentioned above in history of present illness  MEDICAL HISTORY:  Past Medical History:  Diagnosis Date  . Malignant neoplasm of bladder ([place])     SURGICAL HISTORY: Past Surgical History:  Procedure Laterality Date  . ELECTROMAGNETIC NAVIGATION BROCHOSCOPY N/A 05/01/2017   Procedure: ELECTROMAGNETIC NAVIGATION BRONCHOSCOPY;  Surgeon: Flora Lipps, MD;  Location: [facility] ORS;  Service: Cardiopulmonary;  Laterality: N/A;  . ERCP N/A 04/09/2016   Procedure: ENDOSCOPIC RETROGRADE CHOLANGIOPANCREATOGRAPHY (ERCP);  Surgeon: Hulen Luster, MD;  Location: [facility] ENDOSCOPY;  Service: Gastroenterology;   Laterality: N/A;  . Pr Cystectomy,Ileal Conduit/Sigmoid Bladder  05/13/2013   CYSTECTOMY, COMPLETE, WITH URETEROILEAL CONDUIT OR SIGMOID BLADDER, INCLUDING INTESTINE ANASTOMOSIS; Surgeon: Ronnald Nian, MD  . Pr Remove Pelvis Lymph Nodes  05/13/2013   Procedure: PELVIC LYMPHADENECTOMY W/EXT ILIAC (SEPART PROC); Surgeon: Ronnald Nian, MD  . TRANSURETHRAL RESECTION OF BLADDER TUMOR      SOCIAL HISTORY: Social History   Social History  . Marital status: Married    Spouse name: N/A  . Number of children: N/A  . Years of education: N/A   Occupational History  . Not on file.   Social History Main Topics  . Smoking status: Current Every Day Smoker    Packs/day: 0.25    Years: 51.00    Types: Cigarettes  . Smokeless tobacco: Never Used     Comment: 2-3 cigarettes/day  . Alcohol use 0.0 oz/week     Comment: 1 beer every 2 weeks  . Drug use: No  . Sexual activity: Not on file   Other Topics Concern  . Not on file   Social History Narrative  . No narrative on file    FAMILY HISTORY: Family History  Problem Relation Age of Onset  . Bladder Cancer Father   . Prostate cancer Father   . COPD Father   . Stroke Paternal Uncle   . Dementia Mother   . Diabetes Mother     ALLERGIES:  has No Known Allergies.  MEDICATIONS:  Current Facility-Administered Medications  Medication Dose Route Frequency Provider Last Rate Last Dose  . 0.9 %  sodium chloride infusion   Intravenous Continuous Loletha Grayer, MD 75 mL/hr at 05/06/17 1459    .  acetaminophen (TYLENOL) tablet 650 mg  650 mg Oral Q6H PRN Loletha Grayer, MD       Or  . acetaminophen (TYLENOL) suppository 650 mg  650 mg Rectal Q6H PRN Wieting, Richard, MD      . albuterol (PROVENTIL) (2.5 MG/3ML) 0.083% nebulizer solution 2.5 mg  2.5 mg Inhalation Q4H PRN Wieting, Richard, MD      . cefTRIAXone (ROCEPHIN) 1 g in dextrose 5 % 50 mL IVPB  1 g Intravenous Q24H Loletha Grayer, MD   Stopped at 05/06/17 1056  .  enoxaparin (LOVENOX) injection 40 mg  40 mg Subcutaneous Q24H Loletha Grayer, MD   40 mg at 05/05/17 2217  . ipratropium-albuterol (DUONEB) 0.5-2.5 (3) MG/3ML nebulizer solution 3 mL  3 mL Nebulization Q6H Dustin Flock, MD   3 mL at 05/06/17 1940  . multivitamin with minerals tablet 1 tablet  1 tablet Oral Daily Loletha Grayer, MD   1 tablet at 05/06/17 1026  . ondansetron (ZOFRAN) tablet 4 mg  4 mg Oral Q6H PRN Wieting, Richard, MD       Or  . ondansetron [facility]) injection 4 mg  4 mg Intravenous Q6H PRN Loletha Grayer, MD          .  PHYSICAL EXAMINATION:  Vitals:   05/06/17 1403 05/06/17 2032  BP: 129/63 (!) 120/51  Pulse: (!) 57 68  Resp: (!) 24 20  Temp: 98 F (36.7 C) 98.2 F (36.8 C)   Filed Weights   05/05/17 0824  Weight: 185 lb (83.9 kg)    GENERAL: Well-nourished well-developed; Alert, no distress and comfortable.   With his wife.  EYES: no pallor or icterus OROPHARYNX: no thrush or ulceration. NECK: supple, no masses felt LYMPH:  no palpable lymphadenopathy in the cervical, axillary or inguinal regions LUNGS: decreased breath sounds to auscultation at bases and  No wheeze or crackles HEART/CVS: regular rate & rhythm and no murmurs; No lower extremity edema ABDOMEN: abdomen soft, non-tender and normal bowel sounds Musculoskeletal:no cyanosis of digits and no clubbing  PSYCH: alert & oriented x 3; flat affect.  NEURO: no focal motor/sensory deficits SKIN:  no rashes or significant lesions  LABORATORY DATA:  I have reviewed the data as listed Lab Results  Component Value Date   WBC 6.5 05/06/2017   HGB 12.9 (L) 05/06/2017   HCT 37.4 (L) 05/06/2017   MCV 88.6 05/06/2017   PLT 262 05/06/2017    Recent Labs  11/28/16 0915 04/10/17 1028 05/05/17 0833 05/06/17 0448  NA 133* 130* 136 136  K 4.3 4.4 4.5 3.9  CL 100* 98* 101 105  CO2 26 25 27 24   GLUCOSE 134* 110* 114* 103*  BUN 20 19 23* 21*  CREATININE 1.66* 1.68* 1.60* 1.32*  CALCIUM 9.4  9.6 10.1 8.8*  GFRNONAA 40* 40* 42* 53*  GFRAA 47* 46* 49* >60  PROT 7.2 7.9 8.0  --   ALBUMIN 4.1 4.4 4.3  --   AST 18 18 30   --   ALT 16* 13* 52  --   ALKPHOS 73 62 71  --   BILITOT 0.6 0.8 0.7  --     RADIOGRAPHIC STUDIES: I have personally reviewed the radiological images as listed and agreed with the findings in the report. Dg Chest 2 View  Result Date: 05/05/2017 CLINICAL DATA:  71 year old male with history of bladder cancer. Dehydration and lack of energy. EXAM: CHEST  2 VIEW COMPARISON:  No prior chest x-ray.  Chest CT 04/23/2017. FINDINGS: Lung volumes  are normal. No consolidative airspace disease. No pleural effusions. No pneumothorax. No definite suspicious appearing pulmonary nodule or mass noted. Pulmonary vasculature and the cardiomediastinal silhouette are within normal limits. Atherosclerosis in the thoracic aorta. IMPRESSION: 1. No radiographic evidence of acute cardiopulmonary disease. 2. Aortic atherosclerosis. Electronically Signed   By: Vinnie Langton M.D.   On: 05/05/2017 09:21   Ct Head Wo Contrast  Result Date: 05/05/2017 CLINICAL DATA:  71 year old male with bladder cancer and new lung lesion presenting with altered mental status. Initial encounter. EXAM: CT HEAD WITHOUT CONTRAST TECHNIQUE: Contiguous axial images were obtained from the base of the skull through the vertex without intravenous contrast. COMPARISON:  None. FINDINGS: Brain: No intracranial hemorrhage or CT evidence of large acute infarct. No vasogenic edema or mass effect to suggest presence of intracranial metastatic disease. Contrast not administered. Global mild atrophy without hydrocephalus. Vascular: Vascular calcifications. Skull: No destructive lesion. Sinuses/Orbits: No acute orbital abnormality. Visualized paranasal sinuses are clear. Other: Visualized mastoid air cells and middle ear cavities are clear. IMPRESSION: No intracranial hemorrhage or CT evidence of large acute infarct. Unenhanced exam  without evidence of intracranial metastatic disease. Global mild atrophy. Electronically Signed   By: Genia Del M.D.   On: 05/05/2017 09:28   Ct Chest W Contrast  Result Date: 04/23/2017 CLINICAL DATA:  Malignant neoplasm of the urinary bladder. Evaluate lung mass EXAM: CT CHEST WITH CONTRAST TECHNIQUE: Multidetector CT imaging of the chest was performed during intravenous contrast administration. CONTRAST:  44mL ISOVUE-300 IOPAMIDOL (ISOVUE-300) INJECTION 61% COMPARISON:  04/21/2017 FINDINGS: Cardiovascular: The heart size appears within normal limits. Aortic atherosclerosis noted. Calcifications within the LAD and left circumflex and RCA coronary arteries noted. Mediastinum/Nodes: The left hilar lymph node is enlarged measuring 1.4 cm, image 81 of series 2. No enlarged mediastinal or right hilar lymph nodes. The trachea appears patent and is midline. Normal appearance of the esophagus. Lungs/Pleura: Moderate changes of centrilobular and paraseptal emphysema. There is no pleural effusion identified. Cavitary lesion within the left lower lobe is again noted measuring 2.2 cm, image 99 of series 3. Upper Abdomen: Multiple gallstones are identified. Stone within the cystic duct measures 8 mm. Nodule in the left adrenal gland appears unchanged. No acute abnormality noted within the upper abdomen. Musculoskeletal: No aggressive lytic or sclerotic bone lesions. IMPRESSION: 1. Cavitary lesion in the left lower lobe is again noted in remains suspicious for bronchogenic carcinoma versus metastatic disease. Enlarged left hilar lymph node compatible with metastatic adenopathy. 2. Aortic Atherosclerosis (ICD10-I70.0) and Emphysema (ICD10-J43.9). 3. Gallstones. 4. Multi vessel coronary artery calcification. Electronically Signed   By: Kerby Moors M.D.   On: 04/23/2017 11:18   Ct Abdomen Pelvis W Contrast  Result Date: 04/14/2017 CLINICAL DATA:  Bladder cancer diagnosed October 2013 with surgery in May 2014. Last  chemotherapy in 2014. Decreased energy. Decreased appetite. Weight loss. EXAM: CT ABDOMEN AND PELVIS WITH CONTRAST TECHNIQUE: Multidetector CT imaging of the abdomen and pelvis was performed using the standard protocol following bolus administration of intravenous contrast. CONTRAST:  35mL ISOVUE-300 IOPAMIDOL (ISOVUE-300) INJECTION 61% COMPARISON:  03/29/2016 abdominal MRI. Most recent CT of 02/19/2015. FINDINGS: Lower chest: Subpleural right lower lobe pulmonary nodule measures 2 mm on image 6/series 5, similar. Centrilobular emphysema. A partially cavitary left lower lobe lesion measures 2.5 x 1.1 cm on image 1/series 5. This area has not been imaged on prior abdominal studies. The cavitary portion was present back on 04/28/2013, but is newly thick walled however. The solid portion laterally, including at  7 mm, is new. Normal heart size without pericardial or pleural effusion. Multivessel coronary artery atherosclerosis. Tiny hiatal hernia. Contrast in the lower esophagus. Hepatobiliary: Normal liver. Multiple 5 mm gallstones with resolution of pericholecystic edema. Stone in the gallbladder neck at 9 mm on image 25/ series 2. No biliary duct dilatation. Pancreas: Normal, without mass or ductal dilatation. Spleen: Normal in size, without focal abnormality. Adrenals/Urinary Tract: Bilateral adrenal thickening. Moderate right renal cortical thinning. No hydronephrosis. Punctate lower pole left renal collecting system calculus. Status post cystectomy with ileal conduit creation. No acute complication identified. Small parastomal hernia containing fat. Stomach/Bowel: Normal remainder of the stomach. Colonic stool burden suggests constipation. Normal terminal ileum and appendix. Otherwise normal small bowel. Vascular/Lymphatic: Aortic and branch vessel atherosclerosis. Abdominopelvic node dissection. No abdominopelvic adenopathy. Reproductive: Prostatectomy, without locally recurrent disease. Other: No significant  free fluid. Fat containing ventral abdominal wall hernia is unchanged. Musculoskeletal: Disc bulges at L4-5 and L5-S1. IMPRESSION: 1. Status post cysto prostatectomy, without recurrent or metastatic disease. No acute process in the abdomen or pelvis. 2. Left lower lobe partially cavitary mildly thick walled lesion is either new or changed in morphology compared to 2015. This is suspicious for primary bronchogenic carcinoma. Incompletely imaged. Consider dedicated chest CT. These results will be called to the ordering clinician or representative by the Radiologist Assistant, and communication documented in the PACS or zVision Dashboard. 3. Left nephrolithiasis. 4.  Possible constipation. 5. Cholelithiasis. 6. Parastomal and ventral abdominal wall hernias containing fat. 7.  Coronary artery atherosclerosis. Aortic atherosclerosis. 8. Tiny hiatal hernia. Esophageal fluid level suggests dysmotility or gastroesophageal reflux. Electronically Signed   By: Abigail Miyamoto M.D.   On: 04/14/2017 14:17   Nm Pet Image Restag (ps) Skull Base To Thigh  Result Date: 04/21/2017 CLINICAL DATA:  Subsequent treatment strategy for bladder tumor. EXAM: NUCLEAR MEDICINE PET SKULL BASE TO THIGH TECHNIQUE: 13.3 mCi F-18 FDG was injected intravenously. Full-ring PET imaging was performed from the skull base to thigh after the radiotracer. CT data was obtained and used for attenuation correction and anatomic localization. FASTING BLOOD GLUCOSE:  Value: 103 mg/dl COMPARISON:  Multiple exams, including 04/14/2017 FINDINGS: NECK No significant adenopathy is observed in the neck. Carotid atherosclerotic calcification noted. There is some misregistration in the neck noted. CHEST Cavitary left lower lobe nodule 2.5 by 1.5 cm on image 115/3 with maximum standard uptake value 6.8. 1.5 cm left infrahilar lymph node on image 107/3 with maximum SUV 5.1. Background mediastinal blood pool activity 2.9. There is some faint subpleural nodularity along  both upper lobes, not hypermetabolic but well below sensitive PET-CT size thresholds. Mild biapical pleuroparenchymal scarring. Paraseptal emphysema. Coronary, aortic arch, and branch vessel atherosclerotic vascular disease. ABDOMEN/PELVIS Small amount of activity along the right iliac vessels just below the bifurcation is attributed to the left ureter crossing over to the ileal conduit. No appreciable malignancy in the abdomen or pelvis. Cholelithiasis. Prominent stool throughout the colon favors constipation. Atrophic right kidney. Possible 1-2 mm nonobstructive left kidney lower pole renal calculi. Infrarenal abdominal aortic ectasia, 2.8 cm transverse. Cystoprostatectomy.  Sigmoid colon diverticulosis. SKELETON No focal hypermetabolic activity to suggest skeletal metastasis. IMPRESSION: 1. Hypermetabolic cavitary 2.5 by 1.5 cm left lower lobe nodule with adjacent hypermetabolic left infrahilar adenopathy. Possibilities include primary lung cancer, metastatic disease with some local lymphatic drainage, or less likely granulomatous infectious process such as tuberculosis. 2. Aortic Atherosclerosis (ICD10-I70.0) and Emphysema (ICD10-J43.9). Coronary atherosclerosis. 3. Cholelithiasis. 4. Possible nonobstructive left nephrolithiasis. Prior cystoprostatectomy with right ileal conduit.  5. Sigmoid colon diverticulosis. 6. Atrophic right kidney. Electronically Signed   By: Van Clines M.D.   On: 04/21/2017 13:39   Dg C-arm 1-60 Min-no Report  Result Date: 05/01/2017 Fluoroscopy was utilized by the requesting physician.  No radiographic interpretation.    ASSESSMENT & PLAN:   # 71 year old male patient currently admitted to hospital for possible UTI/mental status changes; and also recent biopsy on his left lower lobe lung mass.  # Left lower lobe cavitating lung mass approximately 3 cm in size- status post bronchoscopy biopsy- negative for malignancy. Positive for inflammatory cells. Question infectious  versus Sampling issue. We'll discuss with pulmonary/pathology/radiology for the next step- and the workup of the left lower lobe mass. This was discussed with the patient's wife in detail.  # Subacute mental status changes/increased fatigue over the last 8 weeks- as per the wife not significantly improved. Question related to infection versus other causes. Reviewed the recent CT scan on contrast negative. Recommend neurology evaluation.   # History bladder cancer- no recurrent disease at this time.   # Thank you Dr. Posey Pronto for allowing me to participate in the care of your pleasant patient. Please do not hesitate to contact me with questions or concerns in the interim. My recommendations were relayed to Dr.Patel.   # The above plan of care was discussed the patient /and his wife in detail.      Cammie Sickle, MD 05/06/2017 9:12 PM

## 2017-05-06 NOTE — Progress Notes (Signed)
[place] at [facility]                                                                                                                                                                                  Patient Demographics   Rodney Day, is a 71 y.o. male, DOB - 17-Jul-1946, RXV:[id number]  Admit date - 05/05/2017   Admitting Physician Loletha Grayer, MD  Outpatient Primary MD for the patient is Lloyd Huger, MD   LOS - 1  Subjective: Pt Admitted with UTI as well as generalized weakness Patient had a biopsy for lung mass patient and wife concerned regarding plan of care for that biopsy results negative however PET scan positive   Review of Systems:   CONSTITUTIONAL: No documented fever.  Positive fatigue, positive weakness. No weight gain, no weight loss.  EYES: No blurry or double vision.  ENT: No tinnitus. No postnasal drip. No redness of the oropharynx.  RESPIRATORY: No cough, no wheeze, no hemoptysis. No dyspnea.  CARDIOVASCULAR: No chest pain. No orthopnea. No palpitations. No syncope.  GASTROINTESTINAL: No nausea, no vomiting or diarrhea. No abdominal pain. No melena or hematochezia.  GENITOURINARY: No dysuria or hematuria.  ENDOCRINE: No polyuria or nocturia. No heat or cold intolerance.  HEMATOLOGY: No anemia. No bruising. No bleeding.  INTEGUMENTARY: No rashes. No lesions.  MUSCULOSKELETAL: No arthritis. No swelling. No gout.  NEUROLOGIC: No numbness, tingling, or ataxia. No seizure-type activity.  PSYCHIATRIC: No anxiety. No insomnia. No ADD.    Vitals:   Vitals:   05/05/17 2001 05/06/17 0511 05/06/17 1123 05/06/17 1403  BP: (!) 114/58 122/66 121/66 129/63  Pulse: 70 (!) 59 (!) 59 (!) 57  Resp: 16 16 20  (!) 24  Temp: 97.7 F (36.5 C) 98 F (36.7 C) 97.5 F (36.4 C) 98 F (36.7 C)  TempSrc: Oral Oral Oral Oral  SpO2: 97% 96% 96% 99%  Weight:      Height:        Wt Readings from Last 3 Encounters:  05/05/17 185 lb (83.9 kg)   05/01/17 185 lb (83.9 kg)  04/28/17 185 lb (83.9 kg)     Intake/Output Summary (Last 24 hours) at 05/06/17 1452 Last data filed at 05/06/17 1357  Gross per 24 hour  Intake             1551 ml  Output             1900 ml  Net             -349 ml    Physical Exam:   GENERAL: Pleasant-appearing in no apparent distress.  HEAD, EYES, EARS, NOSE AND THROAT: Atraumatic,  normocephalic. Extraocular muscles are intact. Pupils equal and reactive to light. Sclerae anicteric. No conjunctival injection. No oro-pharyngeal erythema.  NECK: Supple. There is no jugular venous distention. No bruits, no lymphadenopathy, no thyromegaly.  HEART: Regular rate and rhythm,. No murmurs, no rubs, no clicks.  LUNGS: Clear to auscultation bilaterally. No rales or rhonchi. No wheezes.  ABDOMEN: Soft, flat, nontender, nondistended. Has good bowel sounds. No hepatosplenomegaly appreciated.  EXTREMITIES: No evidence of any cyanosis, clubbing, or peripheral edema.  +2 pedal and radial pulses bilaterally.  NEUROLOGIC: The patient is alert, awake, and oriented x3 with no focal motor or sensory deficits appreciated bilaterally.  SKIN: Moist and warm with no rashes appreciated.  Psych: Not anxious, depressed LN: No inguinal LN enlargement    Antibiotics   Anti-infectives    Start     Dose/Rate Route Frequency Ordered Stop   05/06/17 1000  cefTRIAXone (ROCEPHIN) 1 g in dextrose 5 % 50 mL IVPB     1 g 100 mL/hr over 30 Minutes Intravenous Every 24 hours 05/05/17 1150     05/05/17 1145  cefTRIAXone (ROCEPHIN) 1 g in dextrose 5 % 50 mL IVPB  Status:  Discontinued     1 g 100 mL/hr over 30 Minutes Intravenous Every 24 hours 05/05/17 1134 05/05/17 1150   05/05/17 1015  cefTRIAXone (ROCEPHIN) 1 g in dextrose 5 % 50 mL IVPB - Premix     1 g 100 mL/hr over 30 Minutes Intravenous  Once 05/05/17 1000 05/05/17 1131      Medications   Scheduled Meds: . enoxaparin (LOVENOX) injection  40 mg Subcutaneous Q24H  .  ipratropium-albuterol  3 mL Nebulization Q6H  . multivitamin with minerals  1 tablet Oral Daily   Continuous Infusions: . sodium chloride 75 mL/hr at 05/06/17 0227  . cefTRIAXone (ROCEPHIN) IVPB 1 gram/50 mL D5W Stopped (05/06/17 1056)   PRN Meds:.acetaminophen **OR** acetaminophen, albuterol, ondansetron **OR** ondansetron (ZOFRAN) IV   Data Review:   Micro Results Recent Results (from the past 240 hour(s))  Urine culture     Status: Abnormal   Collection Time: 05/05/17  8:49 AM  Result Value Ref Range Status   Specimen Description URINE, CATHETERIZED  Final   Special Requests Normal  Final   Culture MULTIPLE SPECIES PRESENT, SUGGEST RECOLLECTION (A)  Final   Report Status 05/06/2017 FINAL  Final    Radiology Reports Dg Chest 2 View  Result Date: 05/05/2017 CLINICAL DATA:  72 year old male with history of bladder cancer. Dehydration and lack of energy. EXAM: CHEST  2 VIEW COMPARISON:  No prior chest x-ray.  Chest CT 04/23/2017. FINDINGS: Lung volumes are normal. No consolidative airspace disease. No pleural effusions. No pneumothorax. No definite suspicious appearing pulmonary nodule or mass noted. Pulmonary vasculature and the cardiomediastinal silhouette are within normal limits. Atherosclerosis in the thoracic aorta. IMPRESSION: 1. No radiographic evidence of acute cardiopulmonary disease. 2. Aortic atherosclerosis. Electronically Signed   By: Vinnie Langton M.D.   On: 05/05/2017 09:21   Ct Head Wo Contrast  Result Date: 05/05/2017 CLINICAL DATA:  71 year old male with bladder cancer and new lung lesion presenting with altered mental status. Initial encounter. EXAM: CT HEAD WITHOUT CONTRAST TECHNIQUE: Contiguous axial images were obtained from the base of the skull through the vertex without intravenous contrast. COMPARISON:  None. FINDINGS: Brain: No intracranial hemorrhage or CT evidence of large acute infarct. No vasogenic edema or mass effect to suggest presence of  intracranial metastatic disease. Contrast not administered. Global mild atrophy without hydrocephalus. Vascular:  Vascular calcifications. Skull: No destructive lesion. Sinuses/Orbits: No acute orbital abnormality. Visualized paranasal sinuses are clear. Other: Visualized mastoid air cells and middle ear cavities are clear. IMPRESSION: No intracranial hemorrhage or CT evidence of large acute infarct. Unenhanced exam without evidence of intracranial metastatic disease. Global mild atrophy. Electronically Signed   By: Genia Del M.D.   On: 05/05/2017 09:28   Ct Chest W Contrast  Result Date: 04/23/2017 CLINICAL DATA:  Malignant neoplasm of the urinary bladder. Evaluate lung mass EXAM: CT CHEST WITH CONTRAST TECHNIQUE: Multidetector CT imaging of the chest was performed during intravenous contrast administration. CONTRAST:  70mL ISOVUE-300 IOPAMIDOL (ISOVUE-300) INJECTION 61% COMPARISON:  04/21/2017 FINDINGS: Cardiovascular: The heart size appears within normal limits. Aortic atherosclerosis noted. Calcifications within the LAD and left circumflex and RCA coronary arteries noted. Mediastinum/Nodes: The left hilar lymph node is enlarged measuring 1.4 cm, image 81 of series 2. No enlarged mediastinal or right hilar lymph nodes. The trachea appears patent and is midline. Normal appearance of the esophagus. Lungs/Pleura: Moderate changes of centrilobular and paraseptal emphysema. There is no pleural effusion identified. Cavitary lesion within the left lower lobe is again noted measuring 2.2 cm, image 99 of series 3. Upper Abdomen: Multiple gallstones are identified. Stone within the cystic duct measures 8 mm. Nodule in the left adrenal gland appears unchanged. No acute abnormality noted within the upper abdomen. Musculoskeletal: No aggressive lytic or sclerotic bone lesions. IMPRESSION: 1. Cavitary lesion in the left lower lobe is again noted in remains suspicious for bronchogenic carcinoma versus metastatic disease.  Enlarged left hilar lymph node compatible with metastatic adenopathy. 2. Aortic Atherosclerosis (ICD10-I70.0) and Emphysema (ICD10-J43.9). 3. Gallstones. 4. Multi vessel coronary artery calcification. Electronically Signed   By: Kerby Moors M.D.   On: 04/23/2017 11:18   Ct Abdomen Pelvis W Contrast  Result Date: 04/14/2017 CLINICAL DATA:  Bladder cancer diagnosed October 2013 with surgery in May 2014. Last chemotherapy in 2014. Decreased energy. Decreased appetite. Weight loss. EXAM: CT ABDOMEN AND PELVIS WITH CONTRAST TECHNIQUE: Multidetector CT imaging of the abdomen and pelvis was performed using the standard protocol following bolus administration of intravenous contrast. CONTRAST:  20mL ISOVUE-300 IOPAMIDOL (ISOVUE-300) INJECTION 61% COMPARISON:  03/29/2016 abdominal MRI. Most recent CT of 02/19/2015. FINDINGS: Lower chest: Subpleural right lower lobe pulmonary nodule measures 2 mm on image 6/series 5, similar. Centrilobular emphysema. A partially cavitary left lower lobe lesion measures 2.5 x 1.1 cm on image 1/series 5. This area has not been imaged on prior abdominal studies. The cavitary portion was present back on 04/28/2013, but is newly thick walled however. The solid portion laterally, including at 7 mm, is new. Normal heart size without pericardial or pleural effusion. Multivessel coronary artery atherosclerosis. Tiny hiatal hernia. Contrast in the lower esophagus. Hepatobiliary: Normal liver. Multiple 5 mm gallstones with resolution of pericholecystic edema. Stone in the gallbladder neck at 9 mm on image 25/ series 2. No biliary duct dilatation. Pancreas: Normal, without mass or ductal dilatation. Spleen: Normal in size, without focal abnormality. Adrenals/Urinary Tract: Bilateral adrenal thickening. Moderate right renal cortical thinning. No hydronephrosis. Punctate lower pole left renal collecting system calculus. Status post cystectomy with ileal conduit creation. No acute complication  identified. Small parastomal hernia containing fat. Stomach/Bowel: Normal remainder of the stomach. Colonic stool burden suggests constipation. Normal terminal ileum and appendix. Otherwise normal small bowel. Vascular/Lymphatic: Aortic and branch vessel atherosclerosis. Abdominopelvic node dissection. No abdominopelvic adenopathy. Reproductive: Prostatectomy, without locally recurrent disease. Other: No significant free fluid. Fat containing ventral abdominal  wall hernia is unchanged. Musculoskeletal: Disc bulges at L4-5 and L5-S1. IMPRESSION: 1. Status post cysto prostatectomy, without recurrent or metastatic disease. No acute process in the abdomen or pelvis. 2. Left lower lobe partially cavitary mildly thick walled lesion is either new or changed in morphology compared to 2015. This is suspicious for primary bronchogenic carcinoma. Incompletely imaged. Consider dedicated chest CT. These results will be called to the ordering clinician or representative by the Radiologist Assistant, and communication documented in the PACS or zVision Dashboard. 3. Left nephrolithiasis. 4.  Possible constipation. 5. Cholelithiasis. 6. Parastomal and ventral abdominal wall hernias containing fat. 7.  Coronary artery atherosclerosis. Aortic atherosclerosis. 8. Tiny hiatal hernia. Esophageal fluid level suggests dysmotility or gastroesophageal reflux. Electronically Signed   By: Abigail Miyamoto M.D.   On: 04/14/2017 14:17   Nm Pet Image Restag (ps) Skull Base To Thigh  Result Date: 04/21/2017 CLINICAL DATA:  Subsequent treatment strategy for bladder tumor. EXAM: NUCLEAR MEDICINE PET SKULL BASE TO THIGH TECHNIQUE: 13.3 mCi F-18 FDG was injected intravenously. Full-ring PET imaging was performed from the skull base to thigh after the radiotracer. CT data was obtained and used for attenuation correction and anatomic localization. FASTING BLOOD GLUCOSE:  Value: 103 mg/dl COMPARISON:  Multiple exams, including 04/14/2017 FINDINGS: NECK No  significant adenopathy is observed in the neck. Carotid atherosclerotic calcification noted. There is some misregistration in the neck noted. CHEST Cavitary left lower lobe nodule 2.5 by 1.5 cm on image 115/3 with maximum standard uptake value 6.8. 1.5 cm left infrahilar lymph node on image 107/3 with maximum SUV 5.1. Background mediastinal blood pool activity 2.9. There is some faint subpleural nodularity along both upper lobes, not hypermetabolic but well below sensitive PET-CT size thresholds. Mild biapical pleuroparenchymal scarring. Paraseptal emphysema. Coronary, aortic arch, and branch vessel atherosclerotic vascular disease. ABDOMEN/PELVIS Small amount of activity along the right iliac vessels just below the bifurcation is attributed to the left ureter crossing over to the ileal conduit. No appreciable malignancy in the abdomen or pelvis. Cholelithiasis. Prominent stool throughout the colon favors constipation. Atrophic right kidney. Possible 1-2 mm nonobstructive left kidney lower pole renal calculi. Infrarenal abdominal aortic ectasia, 2.8 cm transverse. Cystoprostatectomy.  Sigmoid colon diverticulosis. SKELETON No focal hypermetabolic activity to suggest skeletal metastasis. IMPRESSION: 1. Hypermetabolic cavitary 2.5 by 1.5 cm left lower lobe nodule with adjacent hypermetabolic left infrahilar adenopathy. Possibilities include primary lung cancer, metastatic disease with some local lymphatic drainage, or less likely granulomatous infectious process such as tuberculosis. 2. Aortic Atherosclerosis (ICD10-I70.0) and Emphysema (ICD10-J43.9). Coronary atherosclerosis. 3. Cholelithiasis. 4. Possible nonobstructive left nephrolithiasis. Prior cystoprostatectomy with right ileal conduit. 5. Sigmoid colon diverticulosis. 6. Atrophic right kidney. Electronically Signed   By: Van Clines M.D.   On: 04/21/2017 13:39   Dg C-arm 1-60 Min-no Report  Result Date: 05/01/2017 Fluoroscopy was utilized by the  requesting physician.  No radiographic interpretation.     CBC  Recent Labs Lab 05/05/17 0923 05/06/17 0448  WBC 6.6 6.5  HGB 13.8 12.9*  HCT 39.8* 37.4*  PLT 285 262  MCV 89.3 88.6  MCH 30.9 30.5  MCHC 34.6 34.4  RDW 14.1 14.3  LYMPHSABS 1.5  --   MONOABS 0.6  --   EOSABS 0.1  --   BASOSABS 0.1  --     Chemistries   Recent Labs Lab 05/05/17 0833 05/06/17 0448  NA 136 136  K 4.5 3.9  CL 101 105  CO2 27 24  GLUCOSE 114* 103*  BUN 23* 21*  CREATININE 1.60* 1.32*  CALCIUM 10.1 8.8*  AST 30  --   ALT 52  --   ALKPHOS 71  --   BILITOT 0.7  --    ------------------------------------------------------------------------------------------------------------------ estimated creatinine clearance is 58 mL/min (A) (by C-G formula based on SCr of 1.32 mg/dL (H)). ------------------------------------------------------------------------------------------------------------------ No results for input(s): HGBA1C in the last 72 hours. ------------------------------------------------------------------------------------------------------------------ No results for input(s): CHOL, HDL, LDLCALC, TRIG, CHOLHDL, LDLDIRECT in the last 72 hours. ------------------------------------------------------------------------------------------------------------------  Recent Labs  05/05/17 0833  TSH 1.582   ------------------------------------------------------------------------------------------------------------------  Recent Labs  05/06/17 0448  VITAMINB12 487    Coagulation profile No results for input(s): INR, PROTIME in the last 168 hours.  No results for input(s): DDIMER in the last 72 hours.  Cardiac Enzymes  Recent Labs Lab 05/05/17 0833  TROPONINI <0.03   ------------------------------------------------------------------------------------------------------------------ Invalid input(s): POCBNP    Assessment & Plan  The patient is a 71 year old white male with  history of bladder cancer and a recent PET scan positive for lung mass  1. Complicated urinary tract infection with urostomy. Urine cultures pending continue IV Rocephin 2. History of bladder cancer. Recent PET scan was positive for area in the lung. This was recently biopsied with a bronchoscopy. Pathology results were negative for malignancy. Family concern I will ask his oncologist to discuss further treatment 3. Weakness, acute encephalopathy. Patient's symptoms improved likely due to underlying urinary tract infection 4. Tobacco abuse smoking counseling was done 5. Shortness of breath. Pulmonary recently started on inhalers. 6. Poor appetite and weight loss. I advised the patient that he must eat and drink and stay hydrated. 7. Chronic kidney disease stage III. Monitor with IV fluid hydration.      Code Status Orders        Start     Ordered   05/05/17 1050  Full code  Continuous     05/05/17 1049    Code Status History    Date Active Date Inactive Code Status Order ID Comments User Context   03/29/2016 11:24 AM 03/31/2016  7:25 PM Full Code [id number]  Hubbard Robinson, MD ED    Advance Directive Documentation     Most Recent Value  Type of Advance Directive  Healthcare Power of Attorney  Pre-existing out of facility DNR order (yellow form or pink MOST form)  -  "MOST" Form in Place?  -           Consults oncology  DVT Prophylaxis  Lovenox   Lab Results  Component Value Date   PLT 262 05/06/2017     Time Spent in minutes  73min  Greater than 50% of time spent in care coordination and counseling patient regarding the condition and plan of care.   Dustin Flock M.D on 05/06/2017 at 2:52 PM  Between 7am to 6pm - Pager - [phone]  After 6pm go to [email or web address] - password [email or web address] [facility]   Office  [phone]

## 2017-05-07 LAB — URINALYSIS, COMPLETE (UACMP) WITH MICROSCOPIC
BILIRUBIN URINE: NEGATIVE
Bacteria, UA: NONE SEEN
GLUCOSE, UA: NEGATIVE mg/dL
KETONES UR: NEGATIVE mg/dL
Nitrite: NEGATIVE
PH: 7 (ref 5.0–8.0)
PROTEIN: NEGATIVE mg/dL
Specific Gravity, Urine: 1.009 (ref 1.005–1.030)

## 2017-05-07 LAB — BASIC METABOLIC PANEL
ANION GAP: 5 (ref 5–15)
BUN: 17 mg/dL (ref 6–20)
CO2: 26 mmol/L (ref 22–32)
Calcium: 8.6 mg/dL — ABNORMAL LOW (ref 8.9–10.3)
Chloride: 101 mmol/L (ref 101–111)
Creatinine, Ser: 1.17 mg/dL (ref 0.61–1.24)
GFR calc Af Amer: >60 mL/min (ref >60)
GFR calc non Af Amer: >60 mL/min (ref >60)
GLUCOSE: 104 mg/dL — AB (ref 65–99)
POTASSIUM: 3.8 mmol/L (ref 3.5–5.1)
Sodium: 132 mmol/L — ABNORMAL LOW (ref 135–145)

## 2017-05-07 LAB — CBC
HCT: 35.7 % — ABNORMAL LOW (ref 40.0–52.0)
Hemoglobin: 12.7 g/dL — ABNORMAL LOW (ref 13.0–18.0)
MCH: 31.3 pg (ref 26.0–34.0)
MCHC: 35.5 g/dL (ref 32.0–36.0)
MCV: 88.4 fL (ref 80.0–100.0)
Platelets: 263 10*3/uL (ref 150–440)
RBC: 4.04 MIL/uL — ABNORMAL LOW (ref 4.40–5.90)
RDW: 14.1 % (ref 11.5–14.5)
WBC: 5.6 10*3/uL (ref 3.8–10.6)

## 2017-05-07 MED ORDER — IPRATROPIUM-ALBUTEROL 0.5-2.5 (3) MG/3ML IN SOLN
3.0000 mL | Freq: Two times a day (BID) | RESPIRATORY_TRACT | Status: DC
  Administered 2017-05-08: 3 mL via RESPIRATORY_TRACT
  Filled 2017-05-07: qty 3

## 2017-05-07 NOTE — Progress Notes (Signed)
[place] at [facility]                                                                                                                                                                                  Patient Demographics   Kaston Faughn, is a 71 y.o. male, DOB - [birth year], DUK:[id number]  Admit date - 05/05/2017   Admitting Physician Loletha Grayer, MD  Outpatient Primary MD for the patient is Lloyd Huger, MD   LOS - 2  Subjective: Feeling better, urine culture inconclusive  Review of Systems:   CONSTITUTIONAL: No documented fever.  Positive fatigue, positive weakness. No weight gain, no weight loss.  EYES: No blurry or double vision.  ENT: No tinnitus. No postnasal drip. No redness of the oropharynx.  RESPIRATORY: No cough, no wheeze, no hemoptysis. No dyspnea.  CARDIOVASCULAR: No chest pain. No orthopnea. No palpitations. No syncope.  GASTROINTESTINAL: No nausea, no vomiting or diarrhea. No abdominal pain. No melena or hematochezia.  GENITOURINARY: No dysuria or hematuria.  ENDOCRINE: No polyuria or nocturia. No heat or cold intolerance.  HEMATOLOGY: No anemia. No bruising. No bleeding.  INTEGUMENTARY: No rashes. No lesions.  MUSCULOSKELETAL: No arthritis. No swelling. No gout.  NEUROLOGIC: No numbness, tingling, or ataxia. No seizure-type activity.  PSYCHIATRIC: No anxiety. No insomnia. No ADD.    Vitals:   Vitals:   05/07/17 0047 05/07/17 0456 05/07/17 0748 05/07/17 1046  BP: 121/67 121/68  122/62  Pulse: (!) 57 (!) 56  61  Resp: 20 18  20   Temp: 98 F (36.7 C) 98.1 F (36.7 C)  98.2 F (36.8 C)  TempSrc: Oral Oral  Oral  SpO2: 100% 99% 96% 97%  Weight:      Height:        Wt Readings from Last 3 Encounters:  05/05/17 185 lb (83.9 kg)  05/01/17 185 lb (83.9 kg)  04/28/17 185 lb (83.9 kg)     Intake/Output Summary (Last 24 hours) at 05/07/17 1224 Last data filed at 05/07/17 0912  Gross per 24 hour  Intake           2405.5  ml  Output              580 ml  Net           1825.5 ml    Physical Exam:   GENERAL: Pleasant-appearing in no apparent distress.  HEAD, EYES, EARS, NOSE AND THROAT: Atraumatic, normocephalic. Extraocular muscles are intact. Pupils equal and reactive to light. Sclerae anicteric. No conjunctival injection. No oro-pharyngeal erythema.  NECK: Supple. There is no jugular venous distention. No bruits, no lymphadenopathy, no thyromegaly.  HEART: Regular rate  and rhythm,. No murmurs, no rubs, no clicks.  LUNGS: Clear to auscultation bilaterally. No rales or rhonchi. No wheezes.  ABDOMEN: Soft, flat, nontender, nondistended. Has good bowel sounds. No hepatosplenomegaly appreciated.  EXTREMITIES: No evidence of any cyanosis, clubbing, or peripheral edema.  +2 pedal and radial pulses bilaterally.  NEUROLOGIC: The patient is alert, awake, and oriented x3 with no focal motor or sensory deficits appreciated bilaterally.  SKIN: Moist and warm with no rashes appreciated.  Psych: Not anxious, depressed LN: No inguinal LN enlargement    Antibiotics   Anti-infectives    Start     Dose/Rate Route Frequency Ordered Stop   05/06/17 1000  cefTRIAXone (ROCEPHIN) 1 g in dextrose 5 % 50 mL IVPB     1 g 100 mL/hr over 30 Minutes Intravenous Every 24 hours 05/05/17 1150     05/05/17 1145  cefTRIAXone (ROCEPHIN) 1 g in dextrose 5 % 50 mL IVPB  Status:  Discontinued     1 g 100 mL/hr over 30 Minutes Intravenous Every 24 hours 05/05/17 1134 05/05/17 1150   05/05/17 1015  cefTRIAXone (ROCEPHIN) 1 g in dextrose 5 % 50 mL IVPB - Premix     1 g 100 mL/hr over 30 Minutes Intravenous  Once 05/05/17 1000 05/05/17 1131      Medications   Scheduled Meds: . enoxaparin (LOVENOX) injection  40 mg Subcutaneous Q24H  . ipratropium-albuterol  3 mL Nebulization Q6H  . multivitamin with minerals  1 tablet Oral Daily   Continuous Infusions: . sodium chloride 75 mL/hr at 05/07/17 0453  . cefTRIAXone (ROCEPHIN) IVPB 1  gram/50 mL D5W 1 g (05/07/17 1008)   PRN Meds:.acetaminophen **OR** acetaminophen, albuterol, ondansetron **OR** ondansetron (ZOFRAN) IV   Data Review:   Micro Results Recent Results (from the past 240 hour(s))  Urine culture     Status: Abnormal   Collection Time: 05/05/17  8:49 AM  Result Value Ref Range Status   Specimen Description URINE, CATHETERIZED  Final   Special Requests Normal  Final   Culture MULTIPLE SPECIES PRESENT, SUGGEST RECOLLECTION (A)  Final   Report Status 05/06/2017 FINAL  Final    Radiology Reports Dg Chest 2 View  Result Date: 05/05/2017 CLINICAL DATA:  71 year old male with history of bladder cancer. Dehydration and lack of energy. EXAM: CHEST  2 VIEW COMPARISON:  No prior chest x-ray.  Chest CT 04/23/2017. FINDINGS: Lung volumes are normal. No consolidative airspace disease. No pleural effusions. No pneumothorax. No definite suspicious appearing pulmonary nodule or mass noted. Pulmonary vasculature and the cardiomediastinal silhouette are within normal limits. Atherosclerosis in the thoracic aorta. IMPRESSION: 1. No radiographic evidence of acute cardiopulmonary disease. 2. Aortic atherosclerosis. Electronically Signed   By: Vinnie Langton M.D.   On: 05/05/2017 09:21   Ct Head Wo Contrast  Result Date: 05/05/2017 CLINICAL DATA:  71 year old male with bladder cancer and new lung lesion presenting with altered mental status. Initial encounter. EXAM: CT HEAD WITHOUT CONTRAST TECHNIQUE: Contiguous axial images were obtained from the base of the skull through the vertex without intravenous contrast. COMPARISON:  None. FINDINGS: Brain: No intracranial hemorrhage or CT evidence of large acute infarct. No vasogenic edema or mass effect to suggest presence of intracranial metastatic disease. Contrast not administered. Global mild atrophy without hydrocephalus. Vascular: Vascular calcifications. Skull: No destructive lesion. Sinuses/Orbits: No acute orbital abnormality.  Visualized paranasal sinuses are clear. Other: Visualized mastoid air cells and middle ear cavities are clear. IMPRESSION: No intracranial hemorrhage or CT evidence of large acute  infarct. Unenhanced exam without evidence of intracranial metastatic disease. Global mild atrophy. Electronically Signed   By: Genia Del M.D.   On: 05/05/2017 09:28   Ct Chest W Contrast  Result Date: 04/23/2017 CLINICAL DATA:  Malignant neoplasm of the urinary bladder. Evaluate lung mass EXAM: CT CHEST WITH CONTRAST TECHNIQUE: Multidetector CT imaging of the chest was performed during intravenous contrast administration. CONTRAST:  41mL ISOVUE-300 IOPAMIDOL (ISOVUE-300) INJECTION 61% COMPARISON:  04/21/2017 FINDINGS: Cardiovascular: The heart size appears within normal limits. Aortic atherosclerosis noted. Calcifications within the LAD and left circumflex and RCA coronary arteries noted. Mediastinum/Nodes: The left hilar lymph node is enlarged measuring 1.4 cm, image 81 of series 2. No enlarged mediastinal or right hilar lymph nodes. The trachea appears patent and is midline. Normal appearance of the esophagus. Lungs/Pleura: Moderate changes of centrilobular and paraseptal emphysema. There is no pleural effusion identified. Cavitary lesion within the left lower lobe is again noted measuring 2.2 cm, image 99 of series 3. Upper Abdomen: Multiple gallstones are identified. Stone within the cystic duct measures 8 mm. Nodule in the left adrenal gland appears unchanged. No acute abnormality noted within the upper abdomen. Musculoskeletal: No aggressive lytic or sclerotic bone lesions. IMPRESSION: 1. Cavitary lesion in the left lower lobe is again noted in remains suspicious for bronchogenic carcinoma versus metastatic disease. Enlarged left hilar lymph node compatible with metastatic adenopathy. 2. Aortic Atherosclerosis (ICD10-I70.0) and Emphysema (ICD10-J43.9). 3. Gallstones. 4. Multi vessel coronary artery calcification.  Electronically Signed   By: Kerby Moors M.D.   On: 04/23/2017 11:18   Ct Abdomen Pelvis W Contrast  Result Date: 04/14/2017 CLINICAL DATA:  Bladder cancer diagnosed October 2013 with surgery in May 2014. Last chemotherapy in 2014. Decreased energy. Decreased appetite. Weight loss. EXAM: CT ABDOMEN AND PELVIS WITH CONTRAST TECHNIQUE: Multidetector CT imaging of the abdomen and pelvis was performed using the standard protocol following bolus administration of intravenous contrast. CONTRAST:  73mL ISOVUE-300 IOPAMIDOL (ISOVUE-300) INJECTION 61% COMPARISON:  03/29/2016 abdominal MRI. Most recent CT of 02/19/2015. FINDINGS: Lower chest: Subpleural right lower lobe pulmonary nodule measures 2 mm on image 6/series 5, similar. Centrilobular emphysema. A partially cavitary left lower lobe lesion measures 2.5 x 1.1 cm on image 1/series 5. This area has not been imaged on prior abdominal studies. The cavitary portion was present back on 04/28/2013, but is newly thick walled however. The solid portion laterally, including at 7 mm, is new. Normal heart size without pericardial or pleural effusion. Multivessel coronary artery atherosclerosis. Tiny hiatal hernia. Contrast in the lower esophagus. Hepatobiliary: Normal liver. Multiple 5 mm gallstones with resolution of pericholecystic edema. Stone in the gallbladder neck at 9 mm on image 25/ series 2. No biliary duct dilatation. Pancreas: Normal, without mass or ductal dilatation. Spleen: Normal in size, without focal abnormality. Adrenals/Urinary Tract: Bilateral adrenal thickening. Moderate right renal cortical thinning. No hydronephrosis. Punctate lower pole left renal collecting system calculus. Status post cystectomy with ileal conduit creation. No acute complication identified. Small parastomal hernia containing fat. Stomach/Bowel: Normal remainder of the stomach. Colonic stool burden suggests constipation. Normal terminal ileum and appendix. Otherwise normal small  bowel. Vascular/Lymphatic: Aortic and branch vessel atherosclerosis. Abdominopelvic node dissection. No abdominopelvic adenopathy. Reproductive: Prostatectomy, without locally recurrent disease. Other: No significant free fluid. Fat containing ventral abdominal wall hernia is unchanged. Musculoskeletal: Disc bulges at L4-5 and L5-S1. IMPRESSION: 1. Status post cysto prostatectomy, without recurrent or metastatic disease. No acute process in the abdomen or pelvis. 2. Left lower lobe partially cavitary mildly  thick walled lesion is either new or changed in morphology compared to 2015. This is suspicious for primary bronchogenic carcinoma. Incompletely imaged. Consider dedicated chest CT. These results will be called to the ordering clinician or representative by the Radiologist Assistant, and communication documented in the PACS or zVision Dashboard. 3. Left nephrolithiasis. 4.  Possible constipation. 5. Cholelithiasis. 6. Parastomal and ventral abdominal wall hernias containing fat. 7.  Coronary artery atherosclerosis. Aortic atherosclerosis. 8. Tiny hiatal hernia. Esophageal fluid level suggests dysmotility or gastroesophageal reflux. Electronically Signed   By: Abigail Miyamoto M.D.   On: 04/14/2017 14:17   Nm Pet Image Restag (ps) Skull Base To Thigh  Result Date: 04/21/2017 CLINICAL DATA:  Subsequent treatment strategy for bladder tumor. EXAM: NUCLEAR MEDICINE PET SKULL BASE TO THIGH TECHNIQUE: 13.3 mCi F-18 FDG was injected intravenously. Full-ring PET imaging was performed from the skull base to thigh after the radiotracer. CT data was obtained and used for attenuation correction and anatomic localization. FASTING BLOOD GLUCOSE:  Value: 103 mg/dl COMPARISON:  Multiple exams, including 04/14/2017 FINDINGS: NECK No significant adenopathy is observed in the neck. Carotid atherosclerotic calcification noted. There is some misregistration in the neck noted. CHEST Cavitary left lower lobe nodule 2.5 by 1.5 cm on image  115/3 with maximum standard uptake value 6.8. 1.5 cm left infrahilar lymph node on image 107/3 with maximum SUV 5.1. Background mediastinal blood pool activity 2.9. There is some faint subpleural nodularity along both upper lobes, not hypermetabolic but well below sensitive PET-CT size thresholds. Mild biapical pleuroparenchymal scarring. Paraseptal emphysema. Coronary, aortic arch, and branch vessel atherosclerotic vascular disease. ABDOMEN/PELVIS Small amount of activity along the right iliac vessels just below the bifurcation is attributed to the left ureter crossing over to the ileal conduit. No appreciable malignancy in the abdomen or pelvis. Cholelithiasis. Prominent stool throughout the colon favors constipation. Atrophic right kidney. Possible 1-2 mm nonobstructive left kidney lower pole renal calculi. Infrarenal abdominal aortic ectasia, 2.8 cm transverse. Cystoprostatectomy.  Sigmoid colon diverticulosis. SKELETON No focal hypermetabolic activity to suggest skeletal metastasis. IMPRESSION: 1. Hypermetabolic cavitary 2.5 by 1.5 cm left lower lobe nodule with adjacent hypermetabolic left infrahilar adenopathy. Possibilities include primary lung cancer, metastatic disease with some local lymphatic drainage, or less likely granulomatous infectious process such as tuberculosis. 2. Aortic Atherosclerosis (ICD10-I70.0) and Emphysema (ICD10-J43.9). Coronary atherosclerosis. 3. Cholelithiasis. 4. Possible nonobstructive left nephrolithiasis. Prior cystoprostatectomy with right ileal conduit. 5. Sigmoid colon diverticulosis. 6. Atrophic right kidney. Electronically Signed   By: Van Clines M.D.   On: 04/21/2017 13:39   Dg C-arm 1-60 Min-no Report  Result Date: 05/01/2017 Fluoroscopy was utilized by the requesting physician.  No radiographic interpretation.     CBC  Recent Labs Lab 05/05/17 0923 05/06/17 0448 05/07/17 0439  WBC 6.6 6.5 5.6  HGB 13.8 12.9* 12.7*  HCT 39.8* 37.4* 35.7*  PLT 285  262 263  MCV 89.3 88.6 88.4  MCH 30.9 30.5 31.3  MCHC 34.6 34.4 35.5  RDW 14.1 14.3 14.1  LYMPHSABS 1.5  --   --   MONOABS 0.6  --   --   EOSABS 0.1  --   --   BASOSABS 0.1  --   --     Chemistries   Recent Labs Lab 05/05/17 0833 05/06/17 0448 05/07/17 0439  NA 136 136 132*  K 4.5 3.9 3.8  CL 101 105 101  CO2 27 24 26   GLUCOSE 114* 103* 104*  BUN 23* 21* 17  CREATININE 1.60* 1.32* 1.17  CALCIUM  10.1 8.8* 8.6*  AST 30  --   --   ALT 52  --   --   ALKPHOS 71  --   --   BILITOT 0.7  --   --    ------------------------------------------------------------------------------------------------------------------ estimated creatinine clearance is 65.4 mL/min (by C-G formula based on SCr of 1.17 mg/dL). ------------------------------------------------------------------------------------------------------------------ No results for input(s): HGBA1C in the last 72 hours. ------------------------------------------------------------------------------------------------------------------ No results for input(s): CHOL, HDL, LDLCALC, TRIG, CHOLHDL, LDLDIRECT in the last 72 hours. ------------------------------------------------------------------------------------------------------------------  Recent Labs  05/05/17 0833  TSH 1.582   ------------------------------------------------------------------------------------------------------------------  Recent Labs  05/06/17 0448  VITAMINB12 487    Coagulation profile No results for input(s): INR, PROTIME in the last 168 hours.  No results for input(s): DDIMER in the last 72 hours.  Cardiac Enzymes  Recent Labs Lab 05/05/17 0833  TROPONINI <0.03   ------------------------------------------------------------------------------------------------------------------ Invalid input(s): POCBNP    Assessment & Plan  The patient is a 71 year old white male with history of bladder cancer and a recent PET scan positive for lung  mass  1. Complicated urinary tract infection with urostomy.recheck ua and get urine cx continue iv abx 2. History of bladder cancer. Recent PET scan was positive for area in the lung. This was recently biopsied with a bronchoscopy. Pathology results were negative for malignancy. I discussed the case with Dr. Mortimer Fries recommends outpatient follow-up with oncology to decide on further workup. He was also seen by oncology as well. 3. Weakness, acute encephalopathy. Patient's symptoms improved likely due to underlying urinary tract infection 4. Tobacco abuse smoking counseling was done 5. Shortness of breath. Pulmonary recently started on inhalers. 6. Poor appetite and weight loss. Improved appetite 7. Chronic kidney disease stage III. Monitor with IV fluid hydration.      Code Status Orders        Start     Ordered   05/05/17 1050  Full code  Continuous     05/05/17 1049    Code Status History    Date Active Date Inactive Code Status Order ID Comments User Context   03/29/2016 11:24 AM 03/31/2016  7:25 PM Full Code [id number]  Hubbard Robinson, MD ED    Advance Directive Documentation     Most Recent Value  Type of Advance Directive  Healthcare Power of Attorney  Pre-existing out of facility DNR order (yellow form or pink MOST form)  -  "MOST" Form in Place?  -           Consults oncology  DVT Prophylaxis  Lovenox   Lab Results  Component Value Date   PLT 263 05/07/2017     Time Spent in minutes  75min  Greater than 50% of time spent in care coordination and counseling patient regarding the condition and plan of care.   Dustin Flock M.D on 05/07/2017 at 12:24 PM  Between 7am to 6pm - Pager - [phone]  After 6pm go to [email or web address] - password [email or web address] [facility]   Office  [phone]

## 2017-05-08 ENCOUNTER — Inpatient Hospital Stay: Payer: Medicare Other

## 2017-05-08 DIAGNOSIS — R269 Unspecified abnormalities of gait and mobility: Secondary | ICD-10-CM

## 2017-05-08 LAB — URINE CULTURE: Culture: <10000 — AB

## 2017-05-08 MED ORDER — CEFUROXIME AXETIL 500 MG PO TABS: 500.0000 mg | ORAL_TABLET | Freq: Two times a day (BID) | ORAL | 0 refills | Status: AC

## 2017-05-08 MED ORDER — GADOBENATE DIMEGLUMINE 529 MG/ML IV SOLN
20.0000 mL | Freq: Once | INTRAVENOUS | Status: AC | PRN
  Administered 2017-05-08: 17 mL via INTRAVENOUS

## 2017-05-08 NOTE — Progress Notes (Signed)
Pt tot be discharged per MD order. IV removed. Instructions reviewed with pt and spouse, spouse signed discharge since pt is confused. Scripts electronically sent to pharmacy. Will d/c in wheelchair.

## 2017-05-08 NOTE — Progress Notes (Signed)
Rodney Day   DOB:Jun 04, 1946   CH#:[id number]    Subjective: Patient resting/sleeping in the bed comfortably. Accompanied by his wife. As per the wife patient continues to be intermittently delirious. However patient had been ambulating in the hallways yesterday. And spent time in the chair yesterday.    Objective:  Vitals:   05/07/17 2014 05/08/17 0418  BP: 132/64 127/73  Pulse: (!) 59 (!) 58  Resp: 19 20  Temp: 98.5 F (36.9 C) 98.1 F (36.7 C)     Intake/Output Summary (Last 24 hours) at 05/08/17 0807 Last data filed at 05/08/17 0541  Gross per 24 hour  Intake             1650 ml  Output             2550 ml  Net             -900 ml    GENERALSleepy  no distress and comfortable.Accompanied by his wife  EYES: no pallor or icterus OROPHARYNX: no thrush or ulceration. NECK: supple, no masses felt LUNGS: decreased breath sounds to auscultation at bases and  No wheeze or crackles HEART/CVS: regular rate & rhythm and no murmurs; No lower extremity edema ABDOMEN: abdomen soft, tender  on deep palpation. and normal bowel soundsUrostomy  Musculoskeletal:no cyanosis of digits and no clubbing  NEURO: no focal motor/sensory deficits SKIN:  no rashes or significant lesions   Labs:  Lab Results  Component Value Date   WBC 5.6 05/07/2017   HGB 12.7 (L) 05/07/2017   HCT 35.7 (L) 05/07/2017   MCV 88.4 05/07/2017   PLT 263 05/07/2017   NEUTROABS 4.3 05/05/2017    Lab Results  Component Value Date   NA 132 (L) 05/07/2017   K 3.8 05/07/2017   CL 101 05/07/2017   CO2 26 05/07/2017    Studies:  No results found.  Assessment & Plan:   # 71 year old male patient currently admitted to hospital for possible UTI/mental status changes; and also recent biopsy on his left lower lobe lung mass.  # Left lower lobe cavitating lung mass approximately 3 cm in size- status post bronchoscopy biopsy- negative for malignancy/ positive for inflammatory cells. Question infectious versus  Sampling issue. Patient's case was discussed at tumor conference on 5/24 th. Given the possibility of infectious/inflammatory etiology - recommendation was to repeat a CT scan in 3 months; and if still unresolved than recommend a transthoracic CT-guided biopsy. This was discussed with the patient's wife in detail.  # Subacute mental status changes/increased fatigue over the last 8 weeks; intermittent delirium- question acute encephalopathy secondary to infection versus other causes. Defer to primary team  # History bladder cancer- no recurrent disease at this time.   # Follow-up with Dr. Grayland Ormond as planned on June 8th.   Cammie Sickle, MD 05/08/2017  8:07 AM

## 2017-05-08 NOTE — Care Management (Signed)
Patient was admitted UTI.  Patient lives at home with wife.  PCP [place].  Patient was evaluated by PT.  No PT follow up recommended.  Neuro consult was completed.   MRI was normal.  No RNCM needs identified.

## 2017-05-08 NOTE — Progress Notes (Signed)
Pharmacy Antibiotic Note  Rodney Day is a 71 y.o. male admitted on 05/05/2017 with UTI.  Pharmacy has been consulted for Ceftriaxone dosing.  History of Bladder cancer status post chemotherapy and major surgery including urostomy, bladder resection and prostatectomy.  Plan: Will continue with Ceftriaxone 1 gram IV Q24h.    Height: 6' (182.9 cm) Weight: 185 lb (83.9 kg) IBW/kg (Calculated) : 77.6  Temp (24hrs), Avg:98.2 F (36.8 C), Min:98 F (36.7 C), Max:98.5 F (36.9 C)   Recent Labs Lab 05/05/17 0833 05/05/17 0923 05/06/17 0448 05/07/17 0439  WBC  --  6.6 6.5 5.6  CREATININE 1.60*  --  1.32* 1.17    Estimated Creatinine Clearance: 65.4 mL/min (by C-G formula based on SCr of 1.17 mg/dL).    No Known Allergies  Antimicrobials this admission: CTX 5/22 >>      Microbiology results: 5/22 UCx:   Multiple spp 5/24 UCx sent   Thank you for allowing pharmacy to be a part of this patient's care.  Rocky Morel 05/08/2017 10:54 AM

## 2017-05-08 NOTE — Care Management Important Message (Signed)
Important Message  Patient Details  Name: Rodney Day MRN: [id number] Date of Birth: 05/08/1946   Medicare Important Message Given:  Yes    Beverly Sessions, RN 05/08/2017, 2:32 PM

## 2017-05-08 NOTE — Discharge Summary (Signed)
[facility] [place] at [facility], 71 y.o., DOB 08-13-1946, MRN [id number]. Admission date: 05/05/2017 Discharge Date 05/08/2017 Primary MD Lloyd Huger, MD Admitting Physician Loletha Grayer, MD  Admission Diagnosis  Poor appetite [R63.0] Weakness [R53.1] Mild dehydration [E86.0] Urinary tract infection with hematuria, site unspecified [N39.0, R31.9]  Discharge Diagnosis   Active Problems:   Complicated UTI (urinary tract infection)   History of bladder cancer   Lung mass   Acute encephalopathy   Tobacco abuse   Poor appetite and weight loss   Chronic kidney disease stage III        Hospital CourseGary Gibbs  is a 71 y.o. male with a known history of Bladder cancer status post chemotherapy and major surgery including urostomy, bladder resection and prostatectomy. He presents feeling tired and run down and sleeping a lot and no energy. He's not been eating a long time. He has a poor appetite. Wife states he's also had confusion. No fever chills or sweats. Positive for weight loss. Positive for weakness. Patient in the emergency room was noted to have a urinary tract infection. He was admitted for further evaluation and therapy. He was treated with antibiotics. His urine cultures were inconclusive repeat UA showed resolution of the pyuria. His appetite is improved. His mental status is better. His wife request a neurology evaluation. Neurology recommended MRI of the brain and some other blood work that is currently pending she also recommended outpatient follow-up with neurology. Patient recently also noted to have a lung mass which she had a bronchoscopy with biopsy done according to Dr. Mortimer Fries was difficult. The pathology result shows there was no malignancy except inflammation. He was seen by oncology during the hospitalization he'll need to follow-up outpatient to further work this up.             Consults  hematology/oncology,  pulmonary  Significant Tests:  See full reports for all details    Dg Chest 2 View  Result Date: 05/05/2017 CLINICAL DATA:  71 year old male with history of bladder cancer. Dehydration and lack of energy. EXAM: CHEST  2 VIEW COMPARISON:  No prior chest x-ray.  Chest CT 04/23/2017. FINDINGS: Lung volumes are normal. No consolidative airspace disease. No pleural effusions. No pneumothorax. No definite suspicious appearing pulmonary nodule or mass noted. Pulmonary vasculature and the cardiomediastinal silhouette are within normal limits. Atherosclerosis in the thoracic aorta. IMPRESSION: 1. No radiographic evidence of acute cardiopulmonary disease. 2. Aortic atherosclerosis. Electronically Signed   By: Vinnie Langton M.D.   On: 05/05/2017 09:21   Ct Head Wo Contrast  Result Date: 05/05/2017 CLINICAL DATA:  70 year old male with bladder cancer and new lung lesion presenting with altered mental status. Initial encounter. EXAM: CT HEAD WITHOUT CONTRAST TECHNIQUE: Contiguous axial images were obtained from the base of the skull through the vertex without intravenous contrast. COMPARISON:  None. FINDINGS: Brain: No intracranial hemorrhage or CT evidence of large acute infarct. No vasogenic edema or mass effect to suggest presence of intracranial metastatic disease. Contrast not administered. Global mild atrophy without hydrocephalus. Vascular: Vascular calcifications. Skull: No destructive lesion. Sinuses/Orbits: No acute orbital abnormality. Visualized paranasal sinuses are clear. Other: Visualized mastoid air cells and middle ear cavities are clear. IMPRESSION: No intracranial hemorrhage or CT evidence of large acute infarct. Unenhanced exam without evidence of intracranial metastatic disease. Global mild atrophy. Electronically Signed   By: Genia Del M.D.   On: 05/05/2017 09:28   Ct Chest W Contrast  Result Date:  04/23/2017 CLINICAL DATA:  Malignant neoplasm of the urinary bladder. Evaluate lung mass  EXAM: CT CHEST WITH CONTRAST TECHNIQUE: Multidetector CT imaging of the chest was performed during intravenous contrast administration. CONTRAST:  67mL ISOVUE-300 IOPAMIDOL (ISOVUE-300) INJECTION 61% COMPARISON:  04/21/2017 FINDINGS: Cardiovascular: The heart size appears within normal limits. Aortic atherosclerosis noted. Calcifications within the LAD and left circumflex and RCA coronary arteries noted. Mediastinum/Nodes: The left hilar lymph node is enlarged measuring 1.4 cm, image 81 of series 2. No enlarged mediastinal or right hilar lymph nodes. The trachea appears patent and is midline. Normal appearance of the esophagus. Lungs/Pleura: Moderate changes of centrilobular and paraseptal emphysema. There is no pleural effusion identified. Cavitary lesion within the left lower lobe is again noted measuring 2.2 cm, image 99 of series 3. Upper Abdomen: Multiple gallstones are identified. Stone within the cystic duct measures 8 mm. Nodule in the left adrenal gland appears unchanged. No acute abnormality noted within the upper abdomen. Musculoskeletal: No aggressive lytic or sclerotic bone lesions. IMPRESSION: 1. Cavitary lesion in the left lower lobe is again noted in remains suspicious for bronchogenic carcinoma versus metastatic disease. Enlarged left hilar lymph node compatible with metastatic adenopathy. 2. Aortic Atherosclerosis (ICD10-I70.0) and Emphysema (ICD10-J43.9). 3. Gallstones. 4. Multi vessel coronary artery calcification. Electronically Signed   By: Kerby Moors M.D.   On: 04/23/2017 11:18   Ct Abdomen Pelvis W Contrast  Result Date: 04/14/2017 CLINICAL DATA:  Bladder cancer diagnosed October 2013 with surgery in May 2014. Last chemotherapy in 2014. Decreased energy. Decreased appetite. Weight loss. EXAM: CT ABDOMEN AND PELVIS WITH CONTRAST TECHNIQUE: Multidetector CT imaging of the abdomen and pelvis was performed using the standard protocol following bolus administration of intravenous contrast.  CONTRAST:  19mL ISOVUE-300 IOPAMIDOL (ISOVUE-300) INJECTION 61% COMPARISON:  03/29/2016 abdominal MRI. Most recent CT of 02/19/2015. FINDINGS: Lower chest: Subpleural right lower lobe pulmonary nodule measures 2 mm on image 6/series 5, similar. Centrilobular emphysema. A partially cavitary left lower lobe lesion measures 2.5 x 1.1 cm on image 1/series 5. This area has not been imaged on prior abdominal studies. The cavitary portion was present back on 04/28/2013, but is newly thick walled however. The solid portion laterally, including at 7 mm, is new. Normal heart size without pericardial or pleural effusion. Multivessel coronary artery atherosclerosis. Tiny hiatal hernia. Contrast in the lower esophagus. Hepatobiliary: Normal liver. Multiple 5 mm gallstones with resolution of pericholecystic edema. Stone in the gallbladder neck at 9 mm on image 25/ series 2. No biliary duct dilatation. Pancreas: Normal, without mass or ductal dilatation. Spleen: Normal in size, without focal abnormality. Adrenals/Urinary Tract: Bilateral adrenal thickening. Moderate right renal cortical thinning. No hydronephrosis. Punctate lower pole left renal collecting system calculus. Status post cystectomy with ileal conduit creation. No acute complication identified. Small parastomal hernia containing fat. Stomach/Bowel: Normal remainder of the stomach. Colonic stool burden suggests constipation. Normal terminal ileum and appendix. Otherwise normal small bowel. Vascular/Lymphatic: Aortic and branch vessel atherosclerosis. Abdominopelvic node dissection. No abdominopelvic adenopathy. Reproductive: Prostatectomy, without locally recurrent disease. Other: No significant free fluid. Fat containing ventral abdominal wall hernia is unchanged. Musculoskeletal: Disc bulges at L4-5 and L5-S1. IMPRESSION: 1. Status post cysto prostatectomy, without recurrent or metastatic disease. No acute process in the abdomen or pelvis. 2. Left lower lobe  partially cavitary mildly thick walled lesion is either new or changed in morphology compared to 2015. This is suspicious for primary bronchogenic carcinoma. Incompletely imaged. Consider dedicated chest CT. These results will be called to the ordering  clinician or representative by the Radiologist Assistant, and communication documented in the PACS or zVision Dashboard. 3. Left nephrolithiasis. 4.  Possible constipation. 5. Cholelithiasis. 6. Parastomal and ventral abdominal wall hernias containing fat. 7.  Coronary artery atherosclerosis. Aortic atherosclerosis. 8. Tiny hiatal hernia. Esophageal fluid level suggests dysmotility or gastroesophageal reflux. Electronically Signed   By: Abigail Miyamoto M.D.   On: 04/14/2017 14:17   Nm Pet Image Restag (ps) Skull Base To Thigh  Result Date: 04/21/2017 CLINICAL DATA:  Subsequent treatment strategy for bladder tumor. EXAM: NUCLEAR MEDICINE PET SKULL BASE TO THIGH TECHNIQUE: 13.3 mCi F-18 FDG was injected intravenously. Full-ring PET imaging was performed from the skull base to thigh after the radiotracer. CT data was obtained and used for attenuation correction and anatomic localization. FASTING BLOOD GLUCOSE:  Value: 103 mg/dl COMPARISON:  Multiple exams, including 04/14/2017 FINDINGS: NECK No significant adenopathy is observed in the neck. Carotid atherosclerotic calcification noted. There is some misregistration in the neck noted. CHEST Cavitary left lower lobe nodule 2.5 by 1.5 cm on image 115/3 with maximum standard uptake value 6.8. 1.5 cm left infrahilar lymph node on image 107/3 with maximum SUV 5.1. Background mediastinal blood pool activity 2.9. There is some faint subpleural nodularity along both upper lobes, not hypermetabolic but well below sensitive PET-CT size thresholds. Mild biapical pleuroparenchymal scarring. Paraseptal emphysema. Coronary, aortic arch, and branch vessel atherosclerotic vascular disease. ABDOMEN/PELVIS Small amount of activity along  the right iliac vessels just below the bifurcation is attributed to the left ureter crossing over to the ileal conduit. No appreciable malignancy in the abdomen or pelvis. Cholelithiasis. Prominent stool throughout the colon favors constipation. Atrophic right kidney. Possible 1-2 mm nonobstructive left kidney lower pole renal calculi. Infrarenal abdominal aortic ectasia, 2.8 cm transverse. Cystoprostatectomy.  Sigmoid colon diverticulosis. SKELETON No focal hypermetabolic activity to suggest skeletal metastasis. IMPRESSION: 1. Hypermetabolic cavitary 2.5 by 1.5 cm left lower lobe nodule with adjacent hypermetabolic left infrahilar adenopathy. Possibilities include primary lung cancer, metastatic disease with some local lymphatic drainage, or less likely granulomatous infectious process such as tuberculosis. 2. Aortic Atherosclerosis (ICD10-I70.0) and Emphysema (ICD10-J43.9). Coronary atherosclerosis. 3. Cholelithiasis. 4. Possible nonobstructive left nephrolithiasis. Prior cystoprostatectomy with right ileal conduit. 5. Sigmoid colon diverticulosis. 6. Atrophic right kidney. Electronically Signed   By: Van Clines M.D.   On: 04/21/2017 13:39   Dg C-arm 1-60 Min-no Report  Result Date: 05/01/2017 Fluoroscopy was utilized by the requesting physician.  No radiographic interpretation.       Today   Subjective:   Rodney Day  patient is feeling better appetite improved denies any complaints  Objective:   Blood pressure 127/73, pulse (!) 58, temperature 98.1 F (36.7 C), temperature source Oral, resp. rate 20, height 6' (1.829 m), weight 185 lb (83.9 kg), SpO2 98 %.  .  Intake/Output Summary (Last 24 hours) at 05/08/17 1304 Last data filed at 05/08/17 1006  Gross per 24 hour  Intake             1530 ml  Output             3050 ml  Net            -1520 ml    Exam VITAL SIGNS: Blood pressure 127/73, pulse (!) 58, temperature 98.1 F (36.7 C), temperature source Oral, resp. rate 20,  height 6' (1.829 m), weight 185 lb (83.9 kg), SpO2 98 %.  GENERAL:  71 y.o.-year-old patient lying in the bed with no acute distress.  EYES:  Pupils equal, round, reactive to light and accommodation. No scleral icterus. Extraocular muscles intact.  HEENT: Head atraumatic, normocephalic. Oropharynx and nasopharynx clear.  NECK:  Supple, no jugular venous distention. No thyroid enlargement, no tenderness.  LUNGS: Normal breath sounds bilaterally, no wheezing, rales,rhonchi or crepitation. No use of accessory muscles of respiration.  CARDIOVASCULAR: S1, S2 normal. No murmurs, rubs, or gallops.  ABDOMEN: Soft, nontender, nondistended. Bowel sounds present. No organomegaly or mass.  EXTREMITIES: No pedal edema, cyanosis, or clubbing.  NEUROLOGIC: Cranial nerves II through XII are intact. Muscle strength 5/5 in all extremities. Sensation intact. Gait not checked.  PSYCHIATRIC: The patient is alert and oriented x 3.  SKIN: No obvious rash, lesion, or ulcer.   Data Review     CBC w Diff: Lab Results  Component Value Date   WBC 5.6 05/07/2017   HGB 12.7 (L) 05/07/2017   HGB 14.3 08/14/2014   HCT 35.7 (L) 05/07/2017   HCT 42.4 08/14/2014   PLT 263 05/07/2017   PLT 299 08/14/2014   LYMPHOPCT 23 05/05/2017   LYMPHOPCT 21.2 08/14/2014   MONOPCT 10 05/05/2017   MONOPCT 8.4 08/14/2014   EOSPCT 1 05/05/2017   EOSPCT 1.8 08/14/2014   BASOPCT 1 05/05/2017   BASOPCT 1.2 08/14/2014   CMP: Lab Results  Component Value Date   NA 132 (L) 05/07/2017   NA 136 08/14/2014   K 3.8 05/07/2017   K 4.5 08/14/2014   CL 101 05/07/2017   CL 99 08/14/2014   CO2 26 05/07/2017   CO2 27 08/14/2014   BUN 17 05/07/2017   BUN 17 08/14/2014   CREATININE 1.17 05/07/2017   CREATININE 1.79 (H) 08/14/2014   PROT 8.0 05/05/2017   PROT 7.8 08/14/2014   ALBUMIN 4.3 05/05/2017   ALBUMIN 4.0 08/14/2014   BILITOT 0.7 05/05/2017   BILITOT 0.3 08/14/2014   ALKPHOS 71 05/05/2017   ALKPHOS 108 08/14/2014   AST 30  05/05/2017   AST 15 08/14/2014   ALT 52 05/05/2017   ALT 18 08/14/2014  .  Micro Results Recent Results (from the past 240 hour(s))  Urine culture     Status: Abnormal   Collection Time: 05/05/17  8:49 AM  Result Value Ref Range Status   Specimen Description URINE, CATHETERIZED  Final   Special Requests Normal  Final   Culture MULTIPLE SPECIES PRESENT, SUGGEST RECOLLECTION (A)  Final   Report Status 05/06/2017 FINAL  Final        Code Status Orders        Start     Ordered   05/05/17 1050  Full code  Continuous     05/05/17 1049    Code Status History    Date Active Date Inactive Code Status Order ID Comments User Context   03/29/2016 11:24 AM 03/31/2016  7:25 PM Full Code [id number]  Hubbard Robinson, MD ED    Advance Directive Documentation     Most Recent Value  Type of Advance Directive  Healthcare Power of Attorney  Pre-existing out of facility DNR order (yellow form or pink MOST form)  -  "MOST" Form in Place?  -          Follow-up Information    Tama High III, MD In 1 week.   Specialty:  Internal Medicine Why:  for new primary care establishment GO TO THE [facility] FIRST TO [facility] PCP Contact information: [facility] [place] [address] [phone]  Vladimir Crofts, MD. Go on 07/16/2017.   Specialty:  Neurology Why:  @1 :30PM  intermittetn confusion, neuropathy Contact information: [facility] [place]  [address] [phone]           Discharge Medications   Allergies as of 05/08/2017   No Known Allergies     Medication List    TAKE these medications   albuterol 108 (90 Base) MCG/ACT inhaler Commonly known as:  PROVENTIL HFA;VENTOLIN HFA Inhale 2 puffs into the lungs every 4 (four) hours as needed for wheezing or shortness of breath.   cefUROXime 500 MG tablet Commonly known as:  CEFTIN Take 1 tablet (500 mg total) by mouth 2 (two)  times daily.   Glycopyrrolate-Formoterol 9-4.8 MCG/ACT Aero Commonly known as:  BEVESPI AEROSPHERE Inhale 2 puffs into the lungs 2 (two) times daily.   ibuprofen 200 MG tablet Commonly known as:  ADVIL,MOTRIN Take 400 mg by mouth every 8 (eight) hours as needed for mild pain.   multivitamin with minerals Tabs tablet Take 1 tablet by mouth daily.          Total Time in preparing paper work, data evaluation and todays exam - 35 minutes  Dustin Flock M.D on 05/08/2017 at 1:04 [facility]  [facility]   Office  [phone]

## 2017-05-08 NOTE — Consult Note (Addendum)
Reason for Consult:Confusion, gait instability Referring Physician: Posey Pronto  CC: Confusion, gait instability  HPI: Rodney Day is an 71 y.o. male who has had a steady decline over the past 2 months.  Wife reports that prior to that time the patient was driving, doing all ADL's, walking unassisted but a little forgetful.  Wife takes care of household financial affairs.  Over the past few months he has been stumbling when he walks, sleeps a lot, decreased appetite, weight loss, asking the same questions repeatedly and having decreased interest in previous activities and hygiene.  Is not incontinent.    Past Medical History:  Diagnosis Date  . Malignant neoplasm of bladder [facility])     Past Surgical History:  Procedure Laterality Date  . ELECTROMAGNETIC NAVIGATION BROCHOSCOPY N/A 05/01/2017   Procedure: ELECTROMAGNETIC NAVIGATION BRONCHOSCOPY;  Surgeon: Flora Lipps, MD;  Location: [facility] ORS;  Service: Cardiopulmonary;  Laterality: N/A;  . ERCP N/A 04/09/2016   Procedure: ENDOSCOPIC RETROGRADE CHOLANGIOPANCREATOGRAPHY (ERCP);  Surgeon: Hulen Luster, MD;  Location: [facility] ENDOSCOPY;  Service: Gastroenterology;  Laterality: N/A;  . Pr Cystectomy,Ileal Conduit/Sigmoid Bladder  05/13/2013   CYSTECTOMY, COMPLETE, WITH URETEROILEAL CONDUIT OR SIGMOID BLADDER, INCLUDING INTESTINE ANASTOMOSIS; Surgeon: Ronnald Nian, MD  . Pr Remove Pelvis Lymph Nodes  05/13/2013   Procedure: PELVIC LYMPHADENECTOMY W/EXT ILIAC (SEPART PROC); Surgeon: Ronnald Nian, MD  . TRANSURETHRAL RESECTION OF BLADDER TUMOR      Family History  Problem Relation Age of Onset  . Bladder Cancer Father   . Prostate cancer Father   . COPD Father   . Stroke Paternal Uncle   . Dementia Mother   . Diabetes Mother     Social History:  reports that he has been smoking Cigarettes.  He has a 12.75 pack-year smoking history. He has never used smokeless tobacco. He reports that he drinks alcohol. He reports that he does not use  drugs.  No Known Allergies  Medications:  I have reviewed the patient's current medications. Prior to Admission:  Prescriptions Prior to Admission  Medication Sig Dispense Refill Last Dose  . albuterol (PROVENTIL HFA;VENTOLIN HFA) 108 (90 Base) MCG/ACT inhaler Inhale 2 puffs into the lungs every 4 (four) hours as needed for wheezing or shortness of breath. 1 Inhaler 6 PRN at PRN  . Glycopyrrolate-Formoterol (BEVESPI AEROSPHERE) 9-4.8 MCG/ACT AERO Inhale 2 puffs into the lungs 2 (two) times daily. 10.7 g 5 05/04/2017 at 2000  . ibuprofen (ADVIL,MOTRIN) 200 MG tablet Take 400 mg by mouth every 8 (eight) hours as needed for mild pain.    PRN at PRN  . Multiple Vitamin (MULTIVITAMIN WITH MINERALS) TABS tablet Take 1 tablet by mouth daily.   05/04/2017 at 0800   Scheduled: . enoxaparin (LOVENOX) injection  40 mg Subcutaneous Q24H  . ipratropium-albuterol  3 mL Nebulization BID  . multivitamin with minerals  1 tablet Oral Daily    ROS: History obtained from the patient and wife  General ROS: as noted in HPI Psychological ROS: as noted in HPI Ophthalmic ROS: negative for - blurry vision, double vision, eye pain or loss of vision ENT ROS: negative for - epistaxis, nasal discharge, oral lesions, sore throat, tinnitus or vertigo Allergy and Immunology ROS: negative for - hives or itchy/watery eyes Hematological and Lymphatic ROS: negative for - bleeding problems, bruising or swollen lymph nodes Endocrine ROS: negative for - galactorrhea, hair pattern changes, polydipsia/polyuria or temperature intolerance Respiratory ROS: negative for - cough, hemoptysis, shortness of breath or wheezing Cardiovascular ROS: negative  for - chest pain, dyspnea on exertion, edema or irregular heartbeat Gastrointestinal ROS: negative for - abdominal pain, diarrhea, hematemesis, nausea/vomiting or stool incontinence Genito-Urinary ROS: negative for - dysuria, hematuria, incontinence or urinary  frequency/urgency Musculoskeletal ROS: negative for - joint swelling or muscular weakness Neurological ROS: as noted in HPI Dermatological ROS: negative for rash and skin lesion changes  Physical Examination: Blood pressure 127/73, pulse (!) 58, temperature 98.1 F (36.7 C), temperature source Oral, resp. rate 20, height 6' (1.829 m), weight 83.9 kg (185 lb), SpO2 98 %.  HEENT-  Normocephalic, no lesions, without obvious abnormality.  Normal external eye and conjunctiva.  Normal TM's bilaterally.  Normal auditory canals and external ears. Normal external nose, mucus membranes and septum.  Normal pharynx. Cardiovascular- S1, S2 normal, pulses palpable throughout   Lungs- chest clear, no wheezing, rales, normal symmetric air entry Abdomen- soft, non-tender; bowel sounds normal; no masses,  no organomegaly Extremities- no edema Lymph-no adenopathy palpable Musculoskeletal-no joint tenderness, deformity or swelling Skin-warm and dry, no hyperpigmentation, vitiligo, or suspicious lesions  Neurological Examination   Mental Status: Resting during most of the visit but is easily alerted, oriented, thought content appropriate.  Some short term memory loss noted that may be related to inattention.  Speech fluent without evidence of aphasia.  Requires some reinforcement with 3-step commands.  Cranial Nerves: II: Discs flat bilaterally; Visual fields grossly normal, pupils equal, round, reactive to light and accommodation III,IV, VI: ptosis not present, extra-ocular motions intact bilaterally V,VII: smile symmetric, facial light touch sensation normal bilaterally VIII: hearing normal bilaterally IX,X: gag reflex present XI: bilateral shoulder shrug XII: midline tongue extension Motor: Right : Upper extremity   5/5    Left:     Upper extremity   5/5  Lower extremity   5/5     Lower extremity   5/5 Tone and bulk:normal tone throughout; no atrophy noted Sensory: Pinprick and light touch intact  throughout, bilaterally.  Absent vibratory sensation to the sternum Deep Tendon Reflexes: 2+ in the upper extremities, trace at the knees and absent at the ankles Plantars: Right: downgoing   Left: mute Cerebellar: Normal finger-to-nose and normal heel-to-shin testing bilaterally Gait: not tested due safety concerns   Laboratory Studies:   Basic Metabolic Panel:  Recent Labs Lab 05/05/17 0833 05/06/17 0448 05/07/17 0439  NA 136 136 132*  K 4.5 3.9 3.8  CL 101 105 101  CO2 27 24 26   GLUCOSE 114* 103* 104*  BUN 23* 21* 17  CREATININE 1.60* 1.32* 1.17  CALCIUM 10.1 8.8* 8.6*    Liver Function Tests:  Recent Labs Lab 05/05/17 0833  AST 30  ALT 52  ALKPHOS 71  BILITOT 0.7  PROT 8.0  ALBUMIN 4.3   No results for input(s): LIPASE, AMYLASE in the last 168 hours. No results for input(s): AMMONIA in the last 168 hours.  CBC:  Recent Labs Lab 05/05/17 0923 05/06/17 0448 05/07/17 0439  WBC 6.6 6.5 5.6  NEUTROABS 4.3  --   --   HGB 13.8 12.9* 12.7*  HCT 39.8* 37.4* 35.7*  MCV 89.3 88.6 88.4  PLT 285 262 263    Cardiac Enzymes:  Recent Labs Lab 05/05/17 0833  TROPONINI <0.03    BNP: Invalid input(s): POCBNP  CBG: No results for input(s): GLUCAP in the last 168 hours.  Microbiology: Results for orders placed or performed during the hospital encounter of 05/05/17  Urine culture     Status: Abnormal   Collection Time: 05/05/17  8:49  AM  Result Value Ref Range Status   Specimen Description URINE, CATHETERIZED  Final   Special Requests Normal  Final   Culture MULTIPLE SPECIES PRESENT, SUGGEST RECOLLECTION (A)  Final   Report Status 05/06/2017 FINAL  Final    Coagulation Studies: No results for input(s): LABPROT, INR in the last 72 hours.  Urinalysis:  Recent Labs Lab 05/05/17 0849 05/07/17 1614  COLORURINE YELLOW* YELLOW*  LABSPEC 1.017 1.009  PHURINE 7.0 7.0  GLUCOSEU NEGATIVE NEGATIVE  HGBUR SMALL* MODERATE*  BILIRUBINUR NEGATIVE NEGATIVE   KETONESUR NEGATIVE NEGATIVE  PROTEINUR 100* NEGATIVE  NITRITE NEGATIVE NEGATIVE  LEUKOCYTESUR SMALL* TRACE*    Lipid Panel:  No results found for: CHOL, TRIG, HDL, CHOLHDL, VLDL, LDLCALC  HgbA1C: No results found for: HGBA1C  Urine Drug Screen:  No results found for: LABOPIA, COCAINSCRNUR, LABBENZ, AMPHETMU, THCU, LABBARB  Alcohol Level: No results for input(s): ETH in the last 168 hours.  Other results: EKG: sinus rhythm at 80 bpm.  Imaging: No results found.   Assessment/Plan: 71 year old male presenting with a two month history of functional decline.  Neurological examination significant for a peripheral neuropathy.  Currently with pyuria as well which may be contributing to current mental status since wife reports that the patient is improving some. B12, TSH, RPR are normal. Head CT reviewed and shows no acute changes.   Recommendations: 1.  B1, heavy metal screen, cryoglobulin, copper.  May need some testing for organophosphate exposure as well. 2.  MRI of the brain with and without contrast 3.  Follow up with neurology on an outpatient basis where NCV/EMG can be performed and further cognitive evaluation when patient with cleared urinary issues.  Case discussed with Dr. Radonna Ricker, MD Neurology [phone] 05/08/2017, 10:47 AM

## 2017-05-10 LAB — COPPER, SERUM: Copper: 106 ug/dL (ref 72–166)

## 2017-05-12 LAB — CRYOGLOBULIN

## 2017-05-13 ENCOUNTER — Ambulatory Visit: Payer: Medicare Other

## 2017-05-13 LAB — HEAVY METALS, BLOOD
Arsenic: 3 ug/L (ref 2–23)
Lead: 1 ug/dL (ref 0–19)
Mercury: NOT DETECTED ug/L (ref 0.0–14.9)

## 2017-05-14 LAB — VITAMIN B1: Vitamin B1 (Thiamine): 90.9 nmol/L (ref 66.5–200.0)

## 2017-05-15 ENCOUNTER — Other Ambulatory Visit: Payer: Self-pay

## 2017-05-15 DIAGNOSIS — R05 Cough: Secondary | ICD-10-CM | POA: Diagnosis not present

## 2017-05-15 DIAGNOSIS — N390 Urinary tract infection, site not specified: Secondary | ICD-10-CM | POA: Diagnosis not present

## 2017-05-15 DIAGNOSIS — C679 Malignant neoplasm of bladder, unspecified: Secondary | ICD-10-CM

## 2017-05-15 DIAGNOSIS — R410 Disorientation, unspecified: Secondary | ICD-10-CM | POA: Diagnosis not present

## 2017-05-15 DIAGNOSIS — A499 Bacterial infection, unspecified: Secondary | ICD-10-CM | POA: Diagnosis not present

## 2017-05-19 ENCOUNTER — Emergency Department: Payer: Medicare Other

## 2017-05-19 ENCOUNTER — Encounter: Payer: Self-pay | Admitting: Emergency Medicine

## 2017-05-19 ENCOUNTER — Ambulatory Visit (INDEPENDENT_AMBULATORY_CARE_PROVIDER_SITE_OTHER): Payer: Medicare Other | Admitting: Family

## 2017-05-19 ENCOUNTER — Encounter: Payer: Self-pay | Admitting: Family

## 2017-05-19 ENCOUNTER — Inpatient Hospital Stay
Admission: EM | Admit: 2017-05-19 | Discharge: 2017-05-23 | DRG: 682 | Disposition: A | Discharge status: Home Health | Payer: Medicare Other | Attending: Internal Medicine | Admitting: Internal Medicine

## 2017-05-19 VITALS — BP 98/62 | HR 95 | Temp 98.1°F | Ht 72.0 in | Wt 177.0 lb

## 2017-05-19 DIAGNOSIS — G9340 Encephalopathy, unspecified: Secondary | ICD-10-CM | POA: Diagnosis present

## 2017-05-19 DIAGNOSIS — Z9079 Acquired absence of other genital organ(s): Secondary | ICD-10-CM | POA: Diagnosis not present

## 2017-05-19 DIAGNOSIS — Z8052 Family history of malignant neoplasm of bladder: Secondary | ICD-10-CM | POA: Diagnosis not present

## 2017-05-19 DIAGNOSIS — Z8042 Family history of malignant neoplasm of prostate: Secondary | ICD-10-CM | POA: Diagnosis not present

## 2017-05-19 DIAGNOSIS — N189 Chronic kidney disease, unspecified: Secondary | ICD-10-CM | POA: Diagnosis present

## 2017-05-19 DIAGNOSIS — N390 Urinary tract infection, site not specified: Secondary | ICD-10-CM

## 2017-05-19 DIAGNOSIS — Z936 Other artificial openings of urinary tract status: Secondary | ICD-10-CM | POA: Diagnosis not present

## 2017-05-19 DIAGNOSIS — F0390 Unspecified dementia without behavioral disturbance: Secondary | ICD-10-CM | POA: Diagnosis present

## 2017-05-19 DIAGNOSIS — Z8551 Personal history of malignant neoplasm of bladder: Secondary | ICD-10-CM

## 2017-05-19 DIAGNOSIS — R531 Weakness: Secondary | ICD-10-CM | POA: Diagnosis not present

## 2017-05-19 DIAGNOSIS — R296 Repeated falls: Secondary | ICD-10-CM | POA: Diagnosis present

## 2017-05-19 DIAGNOSIS — R4182 Altered mental status, unspecified: Secondary | ICD-10-CM | POA: Diagnosis not present

## 2017-05-19 DIAGNOSIS — R413 Other amnesia: Secondary | ICD-10-CM | POA: Diagnosis not present

## 2017-05-19 DIAGNOSIS — R627 Adult failure to thrive: Secondary | ICD-10-CM | POA: Diagnosis present

## 2017-05-19 DIAGNOSIS — N179 Acute kidney failure, unspecified: Principal | ICD-10-CM

## 2017-05-19 DIAGNOSIS — Z906 Acquired absence of other parts of urinary tract: Secondary | ICD-10-CM

## 2017-05-19 DIAGNOSIS — J449 Chronic obstructive pulmonary disease, unspecified: Secondary | ICD-10-CM | POA: Diagnosis present

## 2017-05-19 DIAGNOSIS — Z825 Family history of asthma and other chronic lower respiratory diseases: Secondary | ICD-10-CM | POA: Diagnosis not present

## 2017-05-19 DIAGNOSIS — Z6823 Body mass index (BMI) 23.0-23.9, adult: Secondary | ICD-10-CM

## 2017-05-19 DIAGNOSIS — N3001 Acute cystitis with hematuria: Secondary | ICD-10-CM | POA: Diagnosis present

## 2017-05-19 DIAGNOSIS — E43 Unspecified severe protein-calorie malnutrition: Secondary | ICD-10-CM | POA: Diagnosis present

## 2017-05-19 DIAGNOSIS — E871 Hypo-osmolality and hyponatremia: Secondary | ICD-10-CM | POA: Diagnosis present

## 2017-05-19 DIAGNOSIS — K8020 Calculus of gallbladder without cholecystitis without obstruction: Secondary | ICD-10-CM | POA: Diagnosis not present

## 2017-05-19 DIAGNOSIS — E860 Dehydration: Secondary | ICD-10-CM | POA: Diagnosis present

## 2017-05-19 DIAGNOSIS — C679 Malignant neoplasm of bladder, unspecified: Secondary | ICD-10-CM | POA: Diagnosis present

## 2017-05-19 DIAGNOSIS — F17210 Nicotine dependence, cigarettes, uncomplicated: Secondary | ICD-10-CM | POA: Diagnosis present

## 2017-05-19 DIAGNOSIS — N183 Chronic kidney disease, stage 3 (moderate): Secondary | ICD-10-CM | POA: Diagnosis present

## 2017-05-19 LAB — COMPREHENSIVE METABOLIC PANEL
ALT: 21 U/L (ref 17–63)
ANION GAP: 8 (ref 5–15)
AST: 20 U/L (ref 15–41)
Albumin: 4.2 g/dL (ref 3.5–5.0)
Alkaline Phosphatase: 74 U/L (ref 38–126)
BILIRUBIN TOTAL: 0.8 mg/dL (ref 0.3–1.2)
BUN: 25 mg/dL — ABNORMAL HIGH (ref 6–20)
CO2: 27 mmol/L (ref 22–32)
Calcium: 9.6 mg/dL (ref 8.9–10.3)
Chloride: 96 mmol/L — ABNORMAL LOW (ref 101–111)
Creatinine, Ser: 1.81 mg/dL — ABNORMAL HIGH (ref 0.61–1.24)
GFR, EST AFRICAN AMERICAN: 42 mL/min — AB (ref >60)
GFR, EST NON AFRICAN AMERICAN: 36 mL/min — AB (ref >60)
Glucose, Bld: 108 mg/dL — ABNORMAL HIGH (ref 65–99)
POTASSIUM: 4.4 mmol/L (ref 3.5–5.1)
Sodium: 131 mmol/L — ABNORMAL LOW (ref 135–145)
TOTAL PROTEIN: 7.7 g/dL (ref 6.5–8.1)

## 2017-05-19 LAB — URINALYSIS, COMPLETE (UACMP) WITH MICROSCOPIC
Bilirubin Urine: NEGATIVE
Glucose, UA: NEGATIVE mg/dL
Ketones, ur: NEGATIVE mg/dL
Nitrite: NEGATIVE
Protein, ur: NEGATIVE mg/dL
Specific Gravity, Urine: 1.013 (ref 1.005–1.030)
pH: 6 (ref 5.0–8.0)

## 2017-05-19 LAB — CBC WITH DIFFERENTIAL/PLATELET
BASOS ABS: 0.1 10*3/uL (ref 0–0.1)
Basophils Relative: 1 %
Eosinophils Absolute: 0.1 10*3/uL (ref 0–0.7)
Eosinophils Relative: 2 %
HEMATOCRIT: 41.3 % (ref 40.0–52.0)
HEMOGLOBIN: 14.6 g/dL (ref 13.0–18.0)
Lymphocytes Relative: 28 %
Lymphs Abs: 1.8 10*3/uL (ref 1.0–3.6)
MCH: 31.3 pg (ref 26.0–34.0)
MCHC: 35.3 g/dL (ref 32.0–36.0)
MCV: 88.8 fL (ref 80.0–100.0)
Monocytes Absolute: 0.6 10*3/uL (ref 0.2–1.0)
Monocytes Relative: 10 %
NEUTROS ABS: 3.8 10*3/uL (ref 1.4–6.5)
NEUTROS PCT: 59 %
Platelets: 361 10*3/uL (ref 150–440)
RBC: 4.66 MIL/uL (ref 4.40–5.90)
RDW: 14.2 % (ref 11.5–14.5)
WBC: 6.4 10*3/uL (ref 3.8–10.6)

## 2017-05-19 LAB — LACTIC ACID, PLASMA: LACTIC ACID, VENOUS: 0.8 mmol/L (ref 0.5–1.9)

## 2017-05-19 MED ORDER — ACETAMINOPHEN 325 MG PO TABS: 650.0000 mg | ORAL_TABLET | Freq: Four times a day (QID) | ORAL | Status: DC | PRN

## 2017-05-19 MED ORDER — ENOXAPARIN SODIUM 40 MG/0.4ML ~~LOC~~ SOLN
40.0000 mg | SUBCUTANEOUS | Status: DC
  Administered 2017-05-19 – 2017-05-21 (×3): 40 mg via SUBCUTANEOUS
  Filled 2017-05-19 (×3): qty 0.4

## 2017-05-19 MED ORDER — ONDANSETRON HCL 4 MG/2ML IJ SOLN: 4.0000 mg | Freq: Four times a day (QID) | INTRAMUSCULAR | Status: DC | PRN

## 2017-05-19 MED ORDER — SODIUM CHLORIDE 0.9 % IV SOLN
1.0000 g | Freq: Two times a day (BID) | INTRAVENOUS | Status: DC
  Administered 2017-05-20 (×3): 1 g via INTRAVENOUS
  Filled 2017-05-19 (×5): qty 1

## 2017-05-19 MED ORDER — CEFTRIAXONE SODIUM 1 G IJ SOLR
1.0000 g | Freq: Once | INTRAMUSCULAR | Status: AC
  Administered 2017-05-19: 1 g via INTRAVENOUS
  Filled 2017-05-19: qty 10

## 2017-05-19 MED ORDER — SODIUM CHLORIDE 0.9 % IV SOLN
INTRAVENOUS | Status: AC
  Administered 2017-05-19: via INTRAVENOUS

## 2017-05-19 MED ORDER — OXYCODONE HCL 5 MG PO TABS
5.0000 mg | ORAL_TABLET | ORAL | Status: DC | PRN
Start: 2017-05-19 — End: 2017-05-23

## 2017-05-19 MED ORDER — ONDANSETRON HCL 4 MG PO TABS: 4.0000 mg | ORAL_TABLET | Freq: Four times a day (QID) | ORAL | Status: DC | PRN

## 2017-05-19 MED ORDER — SODIUM CHLORIDE 0.9 % IV BOLUS (SEPSIS)
1000.0000 mL | Freq: Once | INTRAVENOUS | Status: AC
  Administered 2017-05-19: 1000 mL via INTRAVENOUS

## 2017-05-19 MED ORDER — ARFORMOTEROL TARTRATE 15 MCG/2ML IN NEBU
15.0000 ug | INHALATION_SOLUTION | Freq: Two times a day (BID) | RESPIRATORY_TRACT | Status: DC
  Administered 2017-05-20 – 2017-05-23 (×7): 15 ug via RESPIRATORY_TRACT
  Filled 2017-05-19 (×8): qty 2

## 2017-05-19 MED ORDER — CEFTRIAXONE SODIUM IN DEXTROSE 20 MG/ML IV SOLN: 1.0000 g | Freq: Once | INTRAVENOUS | Status: DC

## 2017-05-19 MED ORDER — UMECLIDINIUM BROMIDE 62.5 MCG/INH IN AEPB
1.0000 | INHALATION_SPRAY | Freq: Every day | RESPIRATORY_TRACT | Status: DC
  Administered 2017-05-20 – 2017-05-23 (×4): 1 via RESPIRATORY_TRACT
  Filled 2017-05-19 (×2): qty 7

## 2017-05-19 MED ORDER — ACETAMINOPHEN 650 MG RE SUPP
650.0000 mg | Freq: Four times a day (QID) | RECTAL | Status: DC | PRN
Start: 2017-05-19 — End: 2017-05-23

## 2017-05-19 NOTE — ED Triage Notes (Signed)
Seen through [facility] today, and they sent him to ED for evaluation.  Patient was admitted for UTI on May 22 and then discharged May 25-- sent home with Ceftin.  They followed up last Friday, and urine rechecked.  Infection had worsened, and antibiotic changed to Cipro.  Wife states patient patient has been confused for the past two months and not getting better.

## 2017-05-19 NOTE — Progress Notes (Signed)
Subjective:    Patient ID: Windel Keziah, male    DOB: 1946-05-02, 71 y.o.   MRN: [id number]  CC: Tiny Rietz is a 71 y.o. male who presents today to establish care.    HPI: Has not had PCP. Accompanied by wife and son who are very concerned with rapid lethargic, memory changes since UTI 3 weeks ago.   Biggest concern has been UTI recently admitted for 4 days, 05/08/17  Primarily concern has been UTI and treated with ceftin originally. Has been on cipro BID for 5 days from [facility] urgent care and no improvement No fever, N, V, flank pain, chills.   Follows with [facility] since bladder cancer 2013 , s/p urostomy. Chemo. 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 Recently had PET scan, and had biopsy of left lung nodule ( Dr Mortimer Fries) and unsure if malignant per spouse.    MRI head 5/525/18 normal Appt with Manuella Ghazi, neurology in 2 months.  No urology appointment   Normal b12, neg rpr. Normal tsh    HISTORY:  Past Medical History:  Diagnosis Date  . Malignant neoplasm of bladder [facility])    Past Surgical History:  Procedure Laterality Date  . ELECTROMAGNETIC NAVIGATION BROCHOSCOPY N/A 05/01/2017   Procedure: ELECTROMAGNETIC NAVIGATION BRONCHOSCOPY;  Surgeon: Flora Lipps, MD;  Location: [facility] ORS;  Service: Cardiopulmonary;  Laterality: N/A;  . ERCP N/A 04/09/2016   Procedure: ENDOSCOPIC RETROGRADE CHOLANGIOPANCREATOGRAPHY (ERCP);  Surgeon: Hulen Luster, MD;  Location: [facility] ENDOSCOPY;  Service: Gastroenterology;  Laterality: N/A;  . Pr Cystectomy,Ileal Conduit/Sigmoid Bladder  05/13/2013   CYSTECTOMY, COMPLETE, WITH URETEROILEAL CONDUIT OR SIGMOID BLADDER, INCLUDING INTESTINE ANASTOMOSIS; Surgeon: Ronnald Nian, MD  . Pr Remove Pelvis Lymph Nodes  05/13/2013   Procedure: PELVIC LYMPHADENECTOMY W/EXT ILIAC (SEPART PROC); Surgeon: Ronnald Nian, MD  . TRANSURETHRAL RESECTION OF BLADDER TUMOR     Family History  Problem Relation Age of Onset  . Bladder  Cancer Father   . Prostate cancer Father   . COPD Father   . Stroke Paternal Uncle   . Dementia Mother   . Diabetes Mother     Allergies: Patient has no known allergies. Current Outpatient Prescriptions on File Prior to Visit  Medication Sig Dispense Refill  . albuterol (PROVENTIL HFA;VENTOLIN HFA) 108 (90 Base) MCG/ACT inhaler Inhale 2 puffs into the lungs every 4 (four) hours as needed for wheezing or shortness of breath. 1 Inhaler 6  . Glycopyrrolate-Formoterol (BEVESPI AEROSPHERE) 9-4.8 MCG/ACT AERO Inhale 2 puffs into the lungs 2 (two) times daily. 10.7 g 5  . ibuprofen (ADVIL,MOTRIN) 200 MG tablet Take 400 mg by mouth every 8 (eight) hours as needed for mild pain.     . Multiple Vitamin (MULTIVITAMIN WITH MINERALS) TABS tablet Take 1 tablet by mouth daily.     No current facility-administered medications on file prior to visit.     Social History  Substance Use Topics  . Smoking status: Current Every Day Smoker    Packs/day: 0.25    Years: 51.00    Types: Cigarettes  . Smokeless tobacco: Never Used     Comment: 2-3 cigarettes/day  . Alcohol use 0.0 oz/week     Comment: 1 beer every 2 weeks    Review of Systems  Constitutional: Negative for chills and fever.  Respiratory: Negative for cough.   Cardiovascular: Negative for chest pain and palpitations.  Gastrointestinal: Negative for nausea and vomiting.  Genitourinary: Negative for dysuria and flank pain.  Neurological: Negative for dizziness and headaches.  Psychiatric/Behavioral: Positive for confusion.      Objective:    BP 98/62   Pulse 95   Temp 98.1 F (36.7 C) (Oral)   Ht 6' (1.829 m)   Wt 177 lb (80.3 kg)   SpO2 97%   BMI 24.01 kg/m  BP Readings from Last 3 Encounters:  05/19/17 98/62  05/08/17 127/73  05/01/17 123/73   Wt Readings from Last 3 Encounters:  05/19/17 177 lb (80.3 kg)  05/05/17 185 lb (83.9 kg)  05/01/17 185 lb (83.9 kg)    Physical Exam  Constitutional: He appears  well-developed and well-nourished.  Cardiovascular: Regular rhythm and normal heart sounds.   Pulmonary/Chest: Effort normal and breath sounds normal. No respiratory distress. He has no wheezes. He has no rhonchi. He has no rales.  Neurological:  Disoriented x 3. Lethargic. Poor eye contact.   Skin: Skin is warm and dry.  Psychiatric: He has a normal mood and affect. His speech is normal and behavior is normal.  Vitals reviewed.      Assessment & Plan:   1. Urinary tract infection without hematuria, site unspecified Concern for urosepsis. Advised Ed in context of mental status changes. Referral to urology in place. Report given to ED triage.  - Ambulatory referral to Urology  2. Memory changes Work up in hospital thus far normal. Pending appt with  Neurology. Further concern with acute changes from possible urosepsis.   I am having Mr. Perfect maintain his ibuprofen, multivitamin with minerals, albuterol, Glycopyrrolate-Formoterol, and ciprofloxacin.   Meds ordered this encounter  Medications  . ciprofloxacin (CIPRO) 500 MG tablet    Sig: Take by mouth.    Return precautions given.   Risks, benefits, and alternatives of the medications and treatment plan prescribed today were discussed, and patient expressed understanding.   Education regarding symptom management and diagnosis given to patient on AVS.  Continue to follow with Burnard Hawthorne, FNP for routine health maintenance.   Bella Kennedy and I agreed with plan.   Mable Paris, FNP

## 2017-05-19 NOTE — H&P (Signed)
[facility] NAME: Rodney Day    MR#:  [id number]  DATE OF BIRTH:  1946/02/08  DATE OF ADMISSION:  05/19/2017  PRIMARY CARE PHYSICIAN: Burnard Hawthorne, FNP   REQUESTING/REFERRING PHYSICIAN: Alfred Levins, MD  CHIEF COMPLAINT:   Chief Complaint  Patient presents with  . Altered Mental Status    HISTORY OF PRESENT ILLNESS:  Rodney Day  is a 71 y.o. male who presents with persistent confusion presumed secondary to uti.  Patient was diagnosed with uti and treated here about 10 days ago.  His symptoms initially improved some, and he was discharged.  He then got worse again. At follow up in outpatient clinic it was felt he had persistent uti and his antibiotic was changed, but he has since fallen at home several times and was brought back to the ED.  Here workup is indeterminate for uti, but likely confounded by antibiotic use.  Hospitalists were called for admission and treatment.  Of note, patient does have a urostomy bag after surgical intervention for bladder cancer.    PAST MEDICAL HISTORY:   Past Medical History:  Diagnosis Date  . Malignant neoplasm of bladder ([place])     PAST SURGICAL HISTORY:   Past Surgical History:  Procedure Laterality Date  . ELECTROMAGNETIC NAVIGATION BROCHOSCOPY N/A 05/01/2017   Procedure: ELECTROMAGNETIC NAVIGATION BRONCHOSCOPY;  Surgeon: Flora Lipps, MD;  Location: [facility] ORS;  Service: Cardiopulmonary;  Laterality: N/A;  . ERCP N/A 04/09/2016   Procedure: ENDOSCOPIC RETROGRADE CHOLANGIOPANCREATOGRAPHY (ERCP);  Surgeon: Hulen Luster, MD;  Location: [facility] ENDOSCOPY;  Service: Gastroenterology;  Laterality: N/A;  . Pr Cystectomy,Ileal Conduit/Sigmoid Bladder  05/13/2013   CYSTECTOMY, COMPLETE, WITH URETEROILEAL CONDUIT OR SIGMOID BLADDER, INCLUDING INTESTINE ANASTOMOSIS; Surgeon: Ronnald Nian, MD  . Pr Remove Pelvis Lymph Nodes  05/13/2013   Procedure: PELVIC LYMPHADENECTOMY W/EXT ILIAC (SEPART PROC);  Surgeon: Ronnald Nian, MD  . TRANSURETHRAL RESECTION OF BLADDER TUMOR      SOCIAL HISTORY:   Social History  Substance Use Topics  . Smoking status: Current Every Day Smoker    Packs/day: 0.25    Years: 51.00    Types: Cigarettes  . Smokeless tobacco: Never Used     Comment: 2-3 cigarettes/day  . Alcohol use 0.0 oz/week     Comment: 1 beer every 2 weeks    FAMILY HISTORY:   Family History  Problem Relation Age of Onset  . Bladder Cancer Father   . Prostate cancer Father   . COPD Father   . Stroke Paternal Uncle   . Dementia Mother   . Diabetes Mother     DRUG ALLERGIES:  No Known Allergies  MEDICATIONS AT HOME:   Prior to Admission medications   Medication Sig Start Date End Date Taking? Authorizing Provider  albuterol (PROVENTIL HFA;VENTOLIN HFA) 108 (90 Base) MCG/ACT inhaler Inhale 2 puffs into the lungs every 4 (four) hours as needed for wheezing or shortness of breath. 04/28/17  Yes Flora Lipps, MD  ciprofloxacin (CIPRO) 500 MG tablet Take 500 mg by mouth 2 (two) times daily.  05/15/17 05/25/17 Yes [provider]  Glycopyrrolate-Formoterol (BEVESPI AEROSPHERE) 9-4.8 MCG/ACT AERO Inhale 2 puffs into the lungs 2 (two) times daily. 04/28/17  Yes Flora Lipps, MD  ibuprofen (ADVIL,MOTRIN) 200 MG tablet Take 400 mg by mouth every 8 (eight) hours as needed for mild pain.    Yes [provider]  Multiple Vitamin (MULTIVITAMIN WITH MINERALS) TABS tablet Take 1 tablet by  mouth daily.   Yes [provider]    REVIEW OF SYSTEMS:  Review of Systems  Unable to perform ROS: Acuity of condition     VITAL SIGNS:   Vitals:   05/19/17 1800 05/19/17 1830 05/19/17 1900 05/19/17 1930  BP: 116/73 123/65 115/80 (!) 102/59  Pulse:      Resp:      Temp:      TempSrc:      SpO2:      Weight:      Height:       Wt Readings from Last 3 Encounters:  05/19/17 80.3 kg (177 lb)  05/19/17 80.3 kg (177 lb)  05/05/17 83.9 kg (185 lb)    PHYSICAL  EXAMINATION:  Physical Exam  Vitals reviewed. Constitutional: He is oriented to person, place, and time. He appears well-developed and well-nourished. No distress.  HENT:  Head: Normocephalic and atraumatic.  Mouth/Throat: Oropharynx is clear and moist.  Eyes: Conjunctivae and EOM are normal. Pupils are equal, round, and reactive to light. No scleral icterus.  Neck: Normal range of motion. Neck supple. No JVD present. No thyromegaly present.  Cardiovascular: Normal rate, regular rhythm and intact distal pulses.  Exam reveals no gallop and no friction rub.   No murmur heard. Respiratory: Effort normal and breath sounds normal. No respiratory distress. He has no wheezes. He has no rales.  GI: Soft. Bowel sounds are normal. He exhibits no distension. There is no tenderness.  Musculoskeletal: Normal range of motion. He exhibits no edema.  No arthritis, no gout  Lymphadenopathy:    He has no cervical adenopathy.  Neurological: He is alert and oriented to person, place, and time. No cranial nerve deficit.  No dysarthria, no aphasia  Skin: Skin is warm and dry. No rash noted. No erythema.  Psychiatric: He has a normal mood and affect. His behavior is normal. Judgment and thought content normal.    LABORATORY PANEL:   CBC  Recent Labs Lab 05/19/17 1501  WBC 6.4  HGB 14.6  HCT 41.3  PLT 361   ------------------------------------------------------------------------------------------------------------------  Chemistries   Recent Labs Lab 05/19/17 1501  NA 131*  K 4.4  CL 96*  CO2 27  GLUCOSE 108*  BUN 25*  CREATININE 1.81*  CALCIUM 9.6  AST 20  ALT 21  ALKPHOS 74  BILITOT 0.8   ------------------------------------------------------------------------------------------------------------------  Cardiac Enzymes No results for input(s): TROPONINI in the last 168  hours. ------------------------------------------------------------------------------------------------------------------  RADIOLOGY:  Dg Chest 2 View  Result Date: 05/19/2017 CLINICAL DATA:  Altered mental status EXAM: CHEST  2 VIEW COMPARISON:  May 05, 2017 FINDINGS: Lungs are clear. Heart size and pulmonary vascularity are normal. No adenopathy. No bone lesions. IMPRESSION: No edema or consolidation. Electronically Signed   By: Lowella Grip III M.D.   On: 05/19/2017 15:17   Ct Renal Stone Study  Result Date: 05/19/2017 CLINICAL DATA:  UTI EXAM: CT ABDOMEN AND PELVIS WITHOUT CONTRAST TECHNIQUE: Multidetector CT imaging of the abdomen and pelvis was performed following the standard protocol without IV contrast. COMPARISON:  04/21/2017, 04/14/2017 FINDINGS: Lower chest: Previously noted cavitary lesion in the left lower lobe is not identified. Linear scarring in the left lower lobe. No pleural effusion or acute consolidation. Coronary artery calcifications. Trace pericardial effusion or thickening. Hepatobiliary: Calcified gallstones. No biliary dilatation. No focal hepatic abnormality Pancreas: Unremarkable. No pancreatic ductal dilatation or surrounding inflammatory changes. Spleen: Normal in size without focal abnormality. Adrenals/Urinary Tract: Diffuse thickening of the adrenal glands without discrete mass. Atrophic  right kidney. No hydronephrosis. Status post cystectomy with right lower quadrant ileal conduit. Stomach/Bowel: Stomach nonenlarged. No dilated small bowel. Large amount of stool in the colon. No colon wall thickening. Normal appendix. Postsurgical changes of right lower quadrant small bowel. Right lower quadrant ileostomy with parastomal hernia containing fat. Vascular/Lymphatic: Focal ectasia of the distal abdominal aorta measuring 2.8 cm. No significantly enlarged pelvic lymph nodes. Reproductive: Status post prostatectomy Other: No free air or free fluid. Moderate fat in the  periumbilical region Musculoskeletal: Degenerative changes.  No acute osseus abnormality. IMPRESSION: 1. Status post cystoprostatectomy with right lower quadrant ileal conduit. Kidneys demonstrate no hydronephrosis or calcified stones. The right kidney is atrophic. 2. Large volume of stool in the colon 3. Calcified gallstones 4. Ectasia of the infrarenal abdominal aorta a distally measuring up to 2.8 cm. Ectatic abdominal aorta at risk for aneurysm development. Recommend followup by ultrasound in 5 years. This recommendation follows ACR consensus guidelines: White Paper of the ACR Incidental Findings Committee II on Vascular Findings. J Am Coll Radiol 2013; 10:789-794. 5. Previously noted left lower lobe cavitary lesion no longer visualized 6. Periumbilical and parastomal fat containing hernias. Electronically Signed   By: Donavan Foil M.D.   On: 05/19/2017 17:49    EKG:   Orders placed or performed during the hospital encounter of 05/05/17  . ED EKG  . ED EKG    IMPRESSION AND PLAN:  Principal Problem:   Complicated UTI (urinary tract infection) - will escalate antibiotics to meropenem as patient was treated here within last 10 days with rocephin, then discharged home on ceftin, and got a couple doses of cipro prior to coming here today - but still has had incomplete resolution of symptoms. Culture sent from ED, urology consult Active Problems:   Acute encephalopathy - presumed secondary to uti as above, recent MRI was normal, may need further diagnostic workup if symptoms do not clear with escalated antibiotic treatment as above.   Acute on chronic kidney failure ([place]) - expect to improve with hydration with IV fluids, avoid nephrotoxins   Bladder cancer ([place]) - has urostomy bag due to same, may put him at increased risk for uti, or resistant bacteria - though cultures have not grown the same  All the records are reviewed and case discussed with ED provider. Management plans discussed with the  patient and/or family.  DVT PROPHYLAXIS: SubQ lovenox  GI PROPHYLAXIS: None  ADMISSION STATUS: Inpatient  CODE STATUS: Full Code Status History    Date Active Date Inactive Code Status Order ID Comments User Context   05/05/2017 10:49 AM 05/08/2017  7:45 PM Full Code [id number]  Loletha Grayer, MD ED   03/29/2016 11:24 AM 03/31/2016  7:25 PM Full Code [id number]  Hubbard Robinson, MD ED      TOTAL TIME TAKING CARE OF THIS PATIENT: 45 minutes.   Clarissa Laird Brimhall Nizhoni 05/19/2017, 8:31 PM  [facility]  Office  [phone]  CC: Primary care physician; Burnard Hawthorne, FNP  Note:  This document was prepared using Dragon voice recognition software and may include unintentional dictation errors.

## 2017-05-19 NOTE — Progress Notes (Signed)
Pharmacy Antibiotic Note  Rodney Day is a 71 y.o. male admitted on 05/19/2017 with UTI.  Pharmacy has been consulted for meropenem dosing.  Plan: Will initiate meropenem 1g IV q12h per CrCl 26 - 50 ml/min  Height: 6' (182.9 cm) Weight: 175 lb 1.6 oz (79.4 kg) IBW/kg (Calculated) : 77.6  Temp (24hrs), Avg:97.8 F (36.6 C), Min:97.5 F (36.4 C), Max:98.1 F (36.7 C)   Recent Labs Lab 05/19/17 1501  WBC 6.4  CREATININE 1.81*  LATICACIDVEN 0.8    Estimated Creatinine Clearance: 41.7 mL/min (A) (by C-G formula based on SCr of 1.81 mg/dL (H)).    No Known Allergies    Thank you for allowing pharmacy to be a part of this patient's care.  Tobie Lords 05/19/2017 11:47 PM

## 2017-05-19 NOTE — ED Notes (Signed)
Hx bladder ca with bladder removed in 2014. Went through chemo in 2014. Has urostomy bag. Wife states found something on lung and did biopsy of it but unsure if cancerous. Sneaks cigarettes, last one 3 weeks per wife.   cipro started Friday, taken 2 doses a day, last dose taken was this AM.

## 2017-05-19 NOTE — Progress Notes (Signed)
Pre visit review using our clinic review tool, if applicable. No additional management support is needed unless otherwise documented below in the visit note. 

## 2017-05-19 NOTE — ED Provider Notes (Signed)
[facility] Emergency Department Provider Note  ____________________________________________  Time seen: Approximately 4:39 PM  I have reviewed the triage vital signs and the nursing notes.   HISTORY  Chief Complaint Altered Mental Status   HPI Torry Istre is a 71 y.o. male with a history ofbladder cancer and status post urostomy and chemotherapy, bladder resection and prostatectomy who presents for evaluation of confusion and failure to thrive. According to patient's wife, patient's neuro status has been declining for 2 months. He was admitted to hospital 10 days ago and was found to have a urinary tract infection. He was discharged on Ceftin. On Friday he followed up and the UA showed worsening infection. He was switched to Cipro. According to the wife today's day four of Cipro. Patient continues to be very confused, at times he is aggressive, not eating or drinking as much, has been sleeping a lot. No fever or chills. Patient is not complaining of abdominal pain or back pain. Patient is sleepy but easily arousable and at this time denies abdominal pain or back pain. Because of that he feels very tired but has no other medical complaints at this time. According to the wife patient's symptoms have been constant and severe.   Past Medical History:  Diagnosis Date  . Malignant neoplasm of bladder [facility])     Patient Active Problem List   Diagnosis Date Noted  . Complicated UTI (urinary tract infection) 05/05/2017  . Adenopathy   . Lung mass 04/16/2017  . Cholelithiasis with cholecystitis or obstruction 03/29/2016  . Bladder cancer ([place]) 05/25/2015    Past Surgical History:  Procedure Laterality Date  . ELECTROMAGNETIC NAVIGATION BROCHOSCOPY N/A 05/01/2017   Procedure: ELECTROMAGNETIC NAVIGATION BRONCHOSCOPY;  Surgeon: Flora Lipps, MD;  Location: [facility] ORS;  Service: Cardiopulmonary;  Laterality: N/A;  . ERCP N/A 04/09/2016   Procedure: ENDOSCOPIC  RETROGRADE CHOLANGIOPANCREATOGRAPHY (ERCP);  Surgeon: Hulen Luster, MD;  Location: [facility] ENDOSCOPY;  Service: Gastroenterology;  Laterality: N/A;  . Pr Cystectomy,Ileal Conduit/Sigmoid Bladder  05/13/2013   CYSTECTOMY, COMPLETE, WITH URETEROILEAL CONDUIT OR SIGMOID BLADDER, INCLUDING INTESTINE ANASTOMOSIS; Surgeon: Ronnald Nian, MD  . Pr Remove Pelvis Lymph Nodes  05/13/2013   Procedure: PELVIC LYMPHADENECTOMY W/EXT ILIAC (SEPART PROC); Surgeon: Ronnald Nian, MD  . TRANSURETHRAL RESECTION OF BLADDER TUMOR      Prior to Admission medications   Medication Sig Start Date End Date Taking? Authorizing Provider  albuterol (PROVENTIL HFA;VENTOLIN HFA) 108 (90 Base) MCG/ACT inhaler Inhale 2 puffs into the lungs every 4 (four) hours as needed for wheezing or shortness of breath. 04/28/17  Yes Flora Lipps, MD  ciprofloxacin (CIPRO) 500 MG tablet Take 500 mg by mouth 2 (two) times daily.  05/15/17 05/25/17 Yes [provider]  Glycopyrrolate-Formoterol (BEVESPI AEROSPHERE) 9-4.8 MCG/ACT AERO Inhale 2 puffs into the lungs 2 (two) times daily. 04/28/17  Yes Flora Lipps, MD  ibuprofen (ADVIL,MOTRIN) 200 MG tablet Take 400 mg by mouth every 8 (eight) hours as needed for mild pain.    Yes [provider]  Multiple Vitamin (MULTIVITAMIN WITH MINERALS) TABS tablet Take 1 tablet by mouth daily.   Yes [provider]    Allergies Patient has no known allergies.  Family History  Problem Relation Age of Onset  . Bladder Cancer Father   . Prostate cancer Father   . COPD Father   . Stroke Paternal Uncle   . Dementia Mother   . Diabetes Mother     Social History Social History  Substance Use Topics  . Smoking status: Current Every Day Smoker    Packs/day: 0.25    Years: 51.00    Types: Cigarettes  . Smokeless tobacco: Never Used     Comment: 2-3 cigarettes/day  . Alcohol use 0.0 oz/week     Comment: 1 beer every 2 weeks    Review of Systems  Constitutional: Negative for  fever. + confusion, sleepiness Eyes: Negative for visual changes. ENT: Negative for sore throat. Neck: No neck pain  Cardiovascular: Negative for chest pain. Respiratory: Negative for shortness of breath. Gastrointestinal: Negative for abdominal pain, vomiting or diarrhea. Genitourinary: Negative for dysuria. Musculoskeletal: Negative for back pain. Skin: Negative for rash. Neurological: Negative for headaches, weakness or numbness. Psych: No SI or HI  ____________________________________________   PHYSICAL EXAM:  VITAL SIGNS: ED Triage Vitals  Enc Vitals Group     BP 05/19/17 1447 117/69     Pulse Rate 05/19/17 1447 93     Resp 05/19/17 1447 16     Temp 05/19/17 1447 97.5 F (36.4 C)     Temp Source 05/19/17 1447 Oral     SpO2 05/19/17 1447 99 %     Weight 05/19/17 1443 177 lb (80.3 kg)     Height 05/19/17 1443 6' (1.829 m)     Head Circumference --      Peak Flow --      Pain Score 05/19/17 1443 0     Pain Loc --      Pain Edu? --      Excl. in [place]? --     Constitutional: Sleepy but easily arousable, oriented 3, no distress  HEENT:      Head: Normocephalic and atraumatic.         Eyes: Conjunctivae are normal. Sclera is non-icteric.       Mouth/Throat: Mucous membranes are dry.       Neck: Supple with no signs of meningismus. Cardiovascular: Regular rate and rhythm. No murmurs, gallops, or rubs. 2+ symmetrical distal pulses are present in all extremities. No JVD. Respiratory: Normal respiratory effort. Lungs are clear to auscultation bilaterally. No wheezes, crackles, or rhonchi.  Gastrointestinal: Urostomy bag in place with yellow urine. Soft, non tender, and non distended with positive bowel sounds. No rebound or guarding. Musculoskeletal: Nontender with normal range of motion in all extremities. No edema, cyanosis, or erythema of extremities. Neurologic: Normal speech and language. Face is symmetric. Moving all extremities. No gross focal neurologic deficits are  appreciated. Skin: Skin is warm, dry and intact. No rash noted. Psychiatric: Mood and affect are normal. Speech and behavior are normal.  ____________________________________________   LABS (all labs ordered are listed, but only abnormal results are displayed)  Labs Reviewed  COMPREHENSIVE METABOLIC PANEL - Abnormal; Notable for the following:       Result Value   Sodium 131 (*)    Chloride 96 (*)    Glucose, Bld 108 (*)    BUN 25 (*)    Creatinine, Ser 1.81 (*)    GFR calc non Af Amer 36 (*)    GFR calc Af Amer 42 (*)    All other components within normal limits  URINALYSIS, COMPLETE (UACMP) WITH MICROSCOPIC - Abnormal; Notable for the following:    Color, Urine YELLOW (*)    APPearance HAZY (*)    Hgb urine dipstick SMALL (*)    Leukocytes, UA SMALL (*)    Bacteria, UA RARE (*)    Squamous Epithelial / LPF 0-5 (*)  All other components within normal limits  CULTURE, BLOOD (ROUTINE X 2)  CULTURE, BLOOD (ROUTINE X 2)  URINE CULTURE  LACTIC ACID, PLASMA  CBC WITH DIFFERENTIAL/PLATELET  LACTIC ACID, PLASMA   ____________________________________________  EKG  none  ____________________________________________  RADIOLOGY  none  ____________________________________________   PROCEDURES  Procedure(s) performed: None Procedures Critical Care performed:  None ____________________________________________   INITIAL IMPRESSION / ASSESSMENT AND PLAN / ED COURSE  71 y.o. male with a history ofbladder cancer and status post urostomy and chemotherapy, bladder resection and prostatectomy who presents for evaluation of confusion and failure to thrive progressively worse x 2 months in the setting of UTI. Patient looks dry on exam, vitals are stable, he is grossly neurologically intact, his in no distress but very sleepy. Labs show acute kidney injury with a creatinine of 1.81 (baseline is 1.1). Urinalysis is pending. We'll give IV fluids. If UA is positive we'll treat with  IV Rocephin. We'll admit to the hospitalist service.  Clinical Course as of May 19 1928  Tue May 19, 2017  1710 UA showing persistent infection and blood. Patient has kidney stones seen on his PET scan a month ago therefore we'll send patient for CT renal protocol to rule out a ureteral stone which could justify why patient's urinary tract infection has remained persistent in spite of 2 different antibiotics. Patient will be given Rocephin and will be admitted to the hospitalist service.  [CV]    Clinical Course User Index [CV] Alfred Levins, [place], MD    _________________________ 7:29 PM on 05/19/2017 -----------------------------------------  CT with no evidence of kidney stone. Patient will be admitted to hospitalist for AKI and UTI  Pertinent labs & imaging results that were available during my care of the patient were reviewed by me and considered in my medical decision making (see chart for details).    ____________________________________________   FINAL CLINICAL IMPRESSION(S) / ED DIAGNOSES  Final diagnoses:  AKI (acute kidney injury) ([place])  Acute cystitis with hematuria  Encephalopathy      NEW MEDICATIONS STARTED DURING THIS VISIT:  New Prescriptions   No medications on file     Note:  This document was prepared using Dragon voice recognition software and may include unintentional dictation errors.    Rudene Re, MD 05/19/17 [id number]

## 2017-05-19 NOTE — ED Notes (Signed)
Pt went to CT

## 2017-05-19 NOTE — ED Notes (Signed)
May 22nd dx with UTI and admitted to hospital. DC may 25th. Sent home with antibiotics. Wife states sleeps all the time and decreased appetite. Went to [place] on Friday and UTI came back. Currently taking cipro. Pt lying on stretcher, resting, no distress noted.

## 2017-05-19 NOTE — ED Notes (Signed)
This nurse sat down and spoke extensively with family about the delay in having a bed assigned.  Comfort measures offered to family.  Family verbalized understanding and denies any needs at this time.

## 2017-05-19 NOTE — ED Notes (Signed)
Pt urostomy bag had come off and it was full. Wife changed bag but since it came off linen and pt underwear were wet. Linen changed. Pt skin cleaned and brief placed on pt. New bag applied by wife.

## 2017-05-19 NOTE — ED Notes (Signed)
Called pharmacy d/t rocephin being out in pyxis. They stated they would send some to ED

## 2017-05-19 NOTE — ED Notes (Signed)
Updated patient about room being assigned.

## 2017-05-20 DIAGNOSIS — N390 Urinary tract infection, site not specified: Secondary | ICD-10-CM

## 2017-05-20 DIAGNOSIS — N179 Acute kidney failure, unspecified: Secondary | ICD-10-CM

## 2017-05-20 LAB — CBC
HEMATOCRIT: 38.1 % — AB (ref 40.0–52.0)
Hemoglobin: 13.3 g/dL (ref 13.0–18.0)
MCH: 30.6 pg (ref 26.0–34.0)
MCHC: 34.8 g/dL (ref 32.0–36.0)
MCV: 87.9 fL (ref 80.0–100.0)
PLATELETS: 298 10*3/uL (ref 150–440)
RBC: 4.33 MIL/uL — ABNORMAL LOW (ref 4.40–5.90)
RDW: 14.1 % (ref 11.5–14.5)
WBC: 5.2 10*3/uL (ref 3.8–10.6)

## 2017-05-20 LAB — BASIC METABOLIC PANEL
Anion gap: 6 (ref 5–15)
BUN: 21 mg/dL — ABNORMAL HIGH (ref 6–20)
CALCIUM: 9 mg/dL (ref 8.9–10.3)
CO2: 27 mmol/L (ref 22–32)
CREATININE: 1.31 mg/dL — AB (ref 0.61–1.24)
Chloride: 100 mmol/L — ABNORMAL LOW (ref 101–111)
GFR calc Af Amer: >60 mL/min (ref >60)
GFR, EST NON AFRICAN AMERICAN: 54 mL/min — AB (ref >60)
GLUCOSE: 102 mg/dL — AB (ref 65–99)
POTASSIUM: 3.8 mmol/L (ref 3.5–5.1)
SODIUM: 133 mmol/L — AB (ref 135–145)

## 2017-05-20 MED ORDER — ENSURE ENLIVE PO LIQD
237.0000 mL | Freq: Three times a day (TID) | ORAL | Status: DC
  Administered 2017-05-20 – 2017-05-23 (×9): 237 mL via ORAL

## 2017-05-20 NOTE — Progress Notes (Signed)
[facility] NAME: Rodney Day    MR#:  [id number]  DATE OF BIRTH:  Apr 12, 1946  SUBJECTIVE:   Pt. Here due to Altered mental status and suspected UTI. Urinalysis although is not very impressive for UTI. Mental status improved since yesterday. Family is at bedside.  REVIEW OF SYSTEMS:    Review of Systems  Constitutional: Negative for chills and fever.  HENT: Negative for congestion and tinnitus.   Eyes: Negative for blurred vision and double vision.  Respiratory: Negative for cough, shortness of breath and wheezing.   Cardiovascular: Negative for chest pain, orthopnea and PND.  Gastrointestinal: Negative for abdominal pain, diarrhea, nausea and vomiting.  Genitourinary: Negative for dysuria and hematuria.  Neurological: Negative for dizziness, sensory change and focal weakness.  All other systems reviewed and are negative.   Nutrition: Heart Healthy Tolerating Diet: Yes Tolerating PT: Ambulatory   DRUG ALLERGIES:  No Known Allergies  VITALS:  Blood pressure 121/71, pulse 60, temperature 98.2 F (36.8 C), temperature source Oral, resp. rate 16, height 6' (1.829 m), weight 79.4 kg (175 lb 1.6 oz), SpO2 97 %.  PHYSICAL EXAMINATION:   Physical Exam  GENERAL:  70 y.o.-year-old patient lying in bed in no acute distress.  EYES: Pupils equal, round, reactive to light and accommodation. No scleral icterus. Extraocular muscles intact.  HEENT: Head atraumatic, normocephalic. Oropharynx and nasopharynx clear.  NECK:  Supple, no jugular venous distention. No thyroid enlargement, no tenderness.  LUNGS: Normal breath sounds bilaterally, no wheezing, rales, rhonchi. No use of accessory muscles of respiration.  CARDIOVASCULAR: S1, S2 normal. No murmurs, rubs, or gallops.  ABDOMEN: Soft, nontender, nondistended. Bowel sounds present. No organomegaly or mass.  EXTREMITIES: No cyanosis, clubbing or edema b/l.    NEUROLOGIC: Cranial nerves  II through XII are intact. No focal Motor or sensory deficits b/l.   PSYCHIATRIC: The patient is alert and oriented x 2.  SKIN: No obvious rash, lesion, or ulcer.    LABORATORY PANEL:   CBC  Recent Labs Lab 05/20/17 0326  WBC 5.2  HGB 13.3  HCT 38.1*  PLT 298   ------------------------------------------------------------------------------------------------------------------  Chemistries   Recent Labs Lab 05/19/17 1501 05/20/17 0326  NA 131* 133*  K 4.4 3.8  CL 96* 100*  CO2 27 27  GLUCOSE 108* 102*  BUN 25* 21*  CREATININE 1.81* 1.31*  CALCIUM 9.6 9.0  AST 20  --   ALT 21  --   ALKPHOS 74  --   BILITOT 0.8  --    ------------------------------------------------------------------------------------------------------------------  Cardiac Enzymes No results for input(s): TROPONINI in the last 168 hours. ------------------------------------------------------------------------------------------------------------------  RADIOLOGY:  Dg Chest 2 View  Result Date: 05/19/2017 CLINICAL DATA:  Altered mental status EXAM: CHEST  2 VIEW COMPARISON:  May 05, 2017 FINDINGS: Lungs are clear. Heart size and pulmonary vascularity are normal. No adenopathy. No bone lesions. IMPRESSION: No edema or consolidation. Electronically Signed   By: Lowella Grip III M.D.   On: 05/19/2017 15:17   Ct Renal Stone Study  Result Date: 05/19/2017 CLINICAL DATA:  UTI EXAM: CT ABDOMEN AND PELVIS WITHOUT CONTRAST TECHNIQUE: Multidetector CT imaging of the abdomen and pelvis was performed following the standard protocol without IV contrast. COMPARISON:  04/21/2017, 04/14/2017 FINDINGS: Lower chest: Previously noted cavitary lesion in the left lower lobe is not identified. Linear scarring in the left lower lobe. No pleural effusion or acute consolidation. Coronary artery calcifications. Trace pericardial effusion or thickening. Hepatobiliary: Calcified gallstones. No  biliary dilatation. No focal hepatic  abnormality Pancreas: Unremarkable. No pancreatic ductal dilatation or surrounding inflammatory changes. Spleen: Normal in size without focal abnormality. Adrenals/Urinary Tract: Diffuse thickening of the adrenal glands without discrete mass. Atrophic right kidney. No hydronephrosis. Status post cystectomy with right lower quadrant ileal conduit. Stomach/Bowel: Stomach nonenlarged. No dilated small bowel. Large amount of stool in the colon. No colon wall thickening. Normal appendix. Postsurgical changes of right lower quadrant small bowel. Right lower quadrant ileostomy with parastomal hernia containing fat. Vascular/Lymphatic: Focal ectasia of the distal abdominal aorta measuring 2.8 cm. No significantly enlarged pelvic lymph nodes. Reproductive: Status post prostatectomy Other: No free air or free fluid. Moderate fat in the periumbilical region Musculoskeletal: Degenerative changes.  No acute osseus abnormality. IMPRESSION: 1. Status post cystoprostatectomy with right lower quadrant ileal conduit. Kidneys demonstrate no hydronephrosis or calcified stones. The right kidney is atrophic. 2. Large volume of stool in the colon 3. Calcified gallstones 4. Ectasia of the infrarenal abdominal aorta a distally measuring up to 2.8 cm. Ectatic abdominal aorta at risk for aneurysm development. Recommend followup by ultrasound in 5 years. This recommendation follows ACR consensus guidelines: White Paper of the ACR Incidental Findings Committee II on Vascular Findings. J Am Coll Radiol 2013; 10:789-794. 5. Previously noted left lower lobe cavitary lesion no longer visualized 6. Periumbilical and parastomal fat containing hernias. Electronically Signed   By: Donavan Foil M.D.   On: 05/19/2017 17:49     ASSESSMENT AND PLAN:   71 year old male with past medical history of bladder cancer status post ileal conduit who was recently admitted to the hospital for suspected UTI and altered mental status now returns back to the  hospital due to mental status change.  1. AMS/Lethargy - etiology unclear presently.  Unlikely this is a UTI as pt's UA is not that impressive.  - on his last hospitalization pt. Had CT head/MRI Brain which was negative.  - Continue empiric meropenem for now. Await urine cultures. We'll get neurology consult. I suspect patient could have early cognitive decline and possible baseline dementia but will await further neurology input. No other focal metabolic by infectious source noted.  2. Acute kidney injury-secondary to poor by mouth intake and dehydration. Improved with IV fluid hydration. Creatinine close to baseline now.  3. COPD-no acute exacerbation. - cont. Incruse/Ellipta.      All the records are reviewed and case discussed with Care Management/Social Worker. Management plans discussed with the patient, family and they are in agreement.  CODE STATUS: Full code  DVT Prophylaxis: Lovenox  TOTAL TIME TAKING CARE OF THIS PATIENT: 30 minutes.   POSSIBLE D/C IN 1-2 DAYS, DEPENDING ON CLINICAL CONDITION.   Henreitta Leber M.D on 05/20/2017 at 1:09 PM  Between 7am to 6pm - Pager - [phone]  After 6pm go to [email or web address] - [employer] [facility]  Office  [phone]  CC: Primary care physician; Burnard Hawthorne, FNP

## 2017-05-20 NOTE — Progress Notes (Signed)
Initial Nutrition Assessment  DOCUMENTATION CODES:   Severe malnutrition in context of chronic illness  INTERVENTION:  Provide Ensure Enlive po TID, each supplement provides 350 kcal and 20 grams of protein. Patient prefers chocolate. Encouraged wife to continue the Boost High Protein TID at home.  Recommend liberalizing diet from Heart Healthy to Regular.  Encouraged adequate intake of calories and protein at meals. Discussed foods patient enjoys that wife can give him if he refuses a meal due to confusion.   NUTRITION DIAGNOSIS:   Malnutrition (Severe) related to chronic illness (hx bladder cancer, confusion/encephalopathy) as evidenced by 12.3 percent weight loss over 7 months, energy intake < or equal to 75% for > or equal to 1 month, moderate depletion of body fat, severe depletion of body fat.  GOAL:   Patient will meet greater than or equal to 90% of their needs  MONITOR:   PO intake, Supplement acceptance, Labs, Weight trends, I & O's  REASON FOR ASSESSMENT:   Malnutrition Screening Tool    ASSESSMENT:   71 year old male with PMHx of bladder cancer s/p chemotherapy and complete cystectomy in 04/2013, with urostomy, admitted with UTI, acute encephalopathy, acute on chronic kidney failure.   Patient very confused and unable to provide any history. Spoke with wife at bedside. Patient's confusion is a barrier to his intake. He will think he has already eaten and refuse to eat at times. His confusion has been worsening the past two months. Wife reports he also has constipation and has reported some lower abdominal pain. At home she gives him miralax in his coffee daily. Breakfast is usually an egg bacon and cheese sandwich or biscuit with gravy and eggs. Does not typically eat lunch. Dinner is balanced meal with meat and vegetables (such as pot roast or pork). Also snacks during the day on ice cream, sticky buns, peanut butter crackers. Wife reports he can finish most of his  breakfast meal usually but only finishes about 50% of dinner at best. She has been giving him three bottles of chocolate Boost High Protein daily (240 kcal and 20 grams of protein per bottle). Estimated daily caloric intake of approximately 1550 kcal per day at best (74.6% minimum estimated kcal needs).  Wife reports his UBW is 202 lbs. She began noticing weight loss in April. Per chart patient was 199.7 lbs on 11/28/2016 and has lost 24.6 lbs (12.3% body weight) over the past 7 months, which is significant for time frame.   Meal Completion: 100% of breakfast this morning (551 kcal, 29 grams of protein)  Medications reviewed and include: NS @ 100 ml/hr.  Labs reviewed: Sodium 133, Chloride 100, BUN 21, Creatinine 1.31.   Nutrition-Focused physical exam completed. Findings are moderate-severe fat depletion, mild-moderate muscle depletion (patellar region, anterior thigh region, posterior calf region), and no edema. Wife reports patient unable to ambulate well on his own anymore, so some level of muscle wasting in lower body expected with decreased ambulation.   Diet Order:  Diet Heart Room service appropriate? Yes; Fluid consistency: Thin  Skin:  Reviewed, no issues  Last BM:  05/19/2017  Height:   Ht Readings from Last 1 Encounters:  05/19/17 6' (1.829 m)    Weight:   Wt Readings from Last 1 Encounters:  05/19/17 175 lb 1.6 oz (79.4 kg)    Ideal Body Weight:  80.9 kg  BMI:  Body mass index is 23.75 kg/m.  Estimated Nutritional Needs:   Kcal:  2075-2235 (MSJ x 1.3-1.4)  Protein:  95-120 grams (1.2-1.5 grams/kg)  Fluid:  2-2.3 L/day (25-30 ml/kg)  EDUCATION NEEDS:   No education needs identified at this time  Willey Blade, MS, RD, LDN Pager: [phone] After Hours Pager: [phone]

## 2017-05-20 NOTE — Consult Note (Signed)
Urology Consult  I have been asked to see the patient by Dr. Jannifer [name], for evaluation and management of "recurrent UTI"/ history of bladder cancer status post cystoprostatectomy.  Chief Complaint: Confusion  History of Present Illness: Rodney Day is a 71 y.o. year old with a history of T3 N0 bladder cancer status post cystoprostatectomy on 04/2013 at [facility] readmitted overnight with persistent confusion attributed to possible UTI. He is also admitted for the same issue 10 days ago.  He was ultimately discharged on oral antibiotics. He was seen in urgent care for follow-up and hold that his infection failed to resolve and in a boxer change. He returned back to the emergency room accompanied  by his wife yesterday with continued and ongoing neurological symptoms.  Multiple previous urinalysis reviewed including from yesterday. The show urines consistent with having ureteroscopy and urostomy bag without significant bacteria, white blood cell clumps, or nitrates. No urine cultures have been positive other than for mixed flora which is anticipated.  He did undergo a noncontrast CT scan yesterday which showed no perinephric stranding, stones, or any other significant findings. He does have chronic right renal atrophy.  The patient's wife today provides additional history. She reports that since March, his mental status has been declining. His mood is been labile at times. Since about May, she did not trazodone to be alone by himself. His symptoms appear to be rapidly progressing. She does have follow-up with neurology in July.  MRI of the brain on 05/08/2017 by Dr. Doy Mince was unremarkable.    Past Medical History:  Diagnosis Date  . Malignant neoplasm of bladder [facility])     Past Surgical History:  Procedure Laterality Date  . ELECTROMAGNETIC NAVIGATION BROCHOSCOPY N/A 05/01/2017   Procedure: ELECTROMAGNETIC NAVIGATION BRONCHOSCOPY;  Surgeon: Flora Lipps, MD;  Location: [facility] ORS;  Service:  Cardiopulmonary;  Laterality: N/A;  . ERCP N/A 04/09/2016   Procedure: ENDOSCOPIC RETROGRADE CHOLANGIOPANCREATOGRAPHY (ERCP);  Surgeon: Hulen Luster, MD;  Location: [facility] ENDOSCOPY;  Service: Gastroenterology;  Laterality: N/A;  . Pr Cystectomy,Ileal Conduit/Sigmoid Bladder  05/13/2013   CYSTECTOMY, COMPLETE, WITH URETEROILEAL CONDUIT OR SIGMOID BLADDER, INCLUDING INTESTINE ANASTOMOSIS; Surgeon: Ronnald Nian, MD  . Pr Remove Pelvis Lymph Nodes  05/13/2013   Procedure: PELVIC LYMPHADENECTOMY W/EXT ILIAC (SEPART PROC); Surgeon: Ronnald Nian, MD  . TRANSURETHRAL RESECTION OF BLADDER TUMOR      Home Medications:  Current Meds  Medication Sig  . albuterol (PROVENTIL HFA;VENTOLIN HFA) 108 (90 Base) MCG/ACT inhaler Inhale 2 puffs into the lungs every 4 (four) hours as needed for wheezing or shortness of breath.  . ciprofloxacin (CIPRO) 500 MG tablet Take 500 mg by mouth 2 (two) times daily.   . Glycopyrrolate-Formoterol (BEVESPI AEROSPHERE) 9-4.8 MCG/ACT AERO Inhale 2 puffs into the lungs 2 (two) times daily.  Marland Kitchen ibuprofen (ADVIL,MOTRIN) 200 MG tablet Take 400 mg by mouth every 8 (eight) hours as needed for mild pain.   . Multiple Vitamin (MULTIVITAMIN WITH MINERALS) TABS tablet Take 1 tablet by mouth daily.    Allergies: No Known Allergies  Family History  Problem Relation Age of Onset  . Bladder Cancer Father   . Prostate cancer Father   . COPD Father   . Stroke Paternal Uncle   . Dementia Mother   . Diabetes Mother     Social History:  reports that he has been smoking Cigarettes.  He has a 12.75 pack-year smoking history. He has never used smokeless tobacco. He reports that he drinks  alcohol. He reports that he does not use drugs.  ROS: Due to the patient's mental status, a full review systems was unable to be performed.  Physical Exam:  Vital signs in last 24 hours: Temp:  [97.5 F (36.4 C)-98.2 F (36.8 C)] 98.2 F (36.8 C) (06/06 0825) Pulse Rate:  [60-95] 60 (06/06  0825) Resp:  [16-23] 16 (06/06 0825) BP: (98-135)/(50-81) 121/71 (06/06 0825) SpO2:  [95 %-100 %] 97 % (06/06 0825) Weight:  [175 lb 1.6 oz (79.4 kg)-177 lb (80.3 kg)] 175 lb 1.6 oz (79.4 kg) (06/05 2300) Constitutional:No acute distress.  Oriented to self and location but not time and unable to answer other questions. Wife at bedside and provides additional history today. HEENT: [place] AT, moist mucus membranes.  Trachea midline, no masses Cardiovascular: No clubbing, cyanosis, or edema. Respiratory: Normal respiratory effort, lungs clear bilaterally GI: Abdomen is soft, nontender, nondistended, no abdominal masses.  Well-healed midline scar. Urostomy right lower quadrant, pink healthy mucosa with bag in place draining clear yellow urine, scant mucus. GU: No CVA tenderness Skin: No rashes, bruises or suspicious lesions Neurologic: Grossly intact, no focal deficits, moving all 4 extremities Psychiatric: Normal mood and affect   Laboratory Data:   Recent Labs  05/19/17 1501 05/20/17 0326  WBC 6.4 5.2  HGB 14.6 13.3  HCT 41.3 38.1*    Recent Labs  05/19/17 1501 05/20/17 0326  NA 131* 133*  K 4.4 3.8  CL 96* 100*  CO2 27 27  GLUCOSE 108* 102*  BUN 25* 21*  CREATININE 1.81* 1.31*  CALCIUM 9.6 9.0   No results for input(s): LABPT, INR in the last 72 hours. No results for input(s): LABURIN in the last 72 hours. Results for orders placed or performed during the hospital encounter of 05/19/17  Culture, blood (Routine x 2)     Status: None (Preliminary result)   Collection Time: 05/19/17  3:01 PM  Result Value Ref Range Status   Specimen Description BLOOD  RIGHT AC  Final   Special Requests BOTTLES DRAWN AEROBIC AND ANAEROBIC BCHV  Final   Culture NO GROWTH < 24 HOURS  Final   Report Status PENDING  Incomplete  Culture, blood (Routine x 2)     Status: None (Preliminary result)   Collection Time: 05/19/17  3:01 PM  Result Value Ref Range Status   Specimen Description BLOOD LEFT  AC  Final   Special Requests BOTTLES DRAWN AEROBIC AND ANAEROBIC BCAV  Final   Culture NO GROWTH < 24 HOURS  Final   Report Status PENDING  Incomplete     Radiologic Imaging: Dg Chest 2 View  Result Date: 05/19/2017 CLINICAL DATA:  Altered mental status EXAM: CHEST  2 VIEW COMPARISON:  May 05, 2017 FINDINGS: Lungs are clear. Heart size and pulmonary vascularity are normal. No adenopathy. No bone lesions. IMPRESSION: No edema or consolidation. Electronically Signed   By: Lowella Grip III M.D.   On: 05/19/2017 15:17   Ct Renal Stone Study  Result Date: 05/19/2017 CLINICAL DATA:  UTI EXAM: CT ABDOMEN AND PELVIS WITHOUT CONTRAST TECHNIQUE: Multidetector CT imaging of the abdomen and pelvis was performed following the standard protocol without IV contrast. COMPARISON:  04/21/2017, 04/14/2017 FINDINGS: Lower chest: Previously noted cavitary lesion in the left lower lobe is not identified. Linear scarring in the left lower lobe. No pleural effusion or acute consolidation. Coronary artery calcifications. Trace pericardial effusion or thickening. Hepatobiliary: Calcified gallstones. No biliary dilatation. No focal hepatic abnormality Pancreas: Unremarkable. No pancreatic ductal  dilatation or surrounding inflammatory changes. Spleen: Normal in size without focal abnormality. Adrenals/Urinary Tract: Diffuse thickening of the adrenal glands without discrete mass. Atrophic right kidney. No hydronephrosis. Status post cystectomy with right lower quadrant ileal conduit. Stomach/Bowel: Stomach nonenlarged. No dilated small bowel. Large amount of stool in the colon. No colon wall thickening. Normal appendix. Postsurgical changes of right lower quadrant small bowel. Right lower quadrant ileostomy with parastomal hernia containing fat. Vascular/Lymphatic: Focal ectasia of the distal abdominal aorta measuring 2.8 cm. No significantly enlarged pelvic lymph nodes. Reproductive: Status post prostatectomy Other: No free  air or free fluid. Moderate fat in the periumbilical region Musculoskeletal: Degenerative changes.  No acute osseus abnormality. IMPRESSION: 1. Status post cystoprostatectomy with right lower quadrant ileal conduit. Kidneys demonstrate no hydronephrosis or calcified stones. The right kidney is atrophic. 2. Large volume of stool in the colon 3. Calcified gallstones 4. Ectasia of the infrarenal abdominal aorta a distally measuring up to 2.8 cm. Ectatic abdominal aorta at risk for aneurysm development. Recommend followup by ultrasound in 5 years. This recommendation follows ACR consensus guidelines: White Paper of the ACR Incidental Findings Committee II on Vascular Findings. J Am Coll Radiol 2013; 10:789-794. 5. Previously noted left lower lobe cavitary lesion no longer visualized 6. Periumbilical and parastomal fat containing hernias. Electronically Signed   By: Donavan Foil M.D.   On: 05/19/2017 17:49    Assessment/plan:  1) altered mental status/ possible dementia- after careful review of multiple previous urinalyses and urine cultures, do not suspect the patient ever had a urinary tract infection and that his mental status issues are unrelated to "recurrent UTIs" based on his relatively unremarkable urine/negative cultures, and expected appearance of urine collected from the urostomy bag in the setting of ileal conduit.  In addition to this, he has no other signs or symptoms of infection including no fevers, leukocytosis, perinephric stranding, etc. I discussed this directly with Dr. Verdell Carmine today, advised to explore other possible underlying diagnoses.  2) history of bladder cancer- CT scan reviewed today, no evidence of lymphadenopathy or recurrence. Most recent surveillance in 2016 negative, negative cytology. I recommended nonurgent follow-up with his surgeon, Dr. Sherral Hammers at [facility].  3) chronic right renal atrophy- stable  4) acute kidney injury- improving with IV hydration, likely prerenal   Urology  will sign off. Please page with any questions or concerns.  05/20/2017, 12:17 PM  Hollice Espy,  MD

## 2017-05-21 ENCOUNTER — Ambulatory Visit: Payer: Medicare Other

## 2017-05-21 ENCOUNTER — Inpatient Hospital Stay: Payer: Medicare Other

## 2017-05-21 DIAGNOSIS — G9340 Encephalopathy, unspecified: Secondary | ICD-10-CM

## 2017-05-21 LAB — URINE CULTURE: Culture: NO GROWTH

## 2017-05-21 MED ORDER — SODIUM CHLORIDE 0.9 % IV SOLN
INTRAVENOUS | Status: DC
Start: 2017-05-21 — End: 2017-05-23
  Administered 2017-05-21 – 2017-05-23 (×6): via INTRAVENOUS

## 2017-05-21 NOTE — Progress Notes (Signed)
[name] [place] NAME: Rodney Day    MR#:  [id number]  DATE OF BIRTH:  July 26, 1946  SUBJECTIVE:   Pt. Here due to Altered mental status and suspected UTI. UTI has been ruled out. Still remains confused and sleepy most of the times.  Son at bedside.   REVIEW OF SYSTEMS:    Review of Systems  Constitutional: Negative for chills and fever.  HENT: Negative for congestion and tinnitus.   Eyes: Negative for blurred vision and double vision.  Respiratory: Negative for cough, shortness of breath and wheezing.   Cardiovascular: Negative for chest pain, orthopnea and PND.  Gastrointestinal: Negative for abdominal pain, diarrhea, nausea and vomiting.  Genitourinary: Negative for dysuria and hematuria.  Neurological: Negative for dizziness, sensory change and focal weakness.  All other systems reviewed and are negative.   Nutrition: Heart Healthy Tolerating Diet: Yes Tolerating PT: Ambulatory   DRUG ALLERGIES:  No Known Allergies  VITALS:  Blood pressure 106/63, pulse 79, temperature 98.4 F (36.9 C), temperature source Oral, resp. rate 18, height 6' (1.829 m), weight 79.4 kg (175 lb 1.6 oz), SpO2 96 %.  PHYSICAL EXAMINATION:   Physical Exam  GENERAL:  71 y.o.-year-old patient lying in bed in no acute distress.  EYES: Pupils equal, round, reactive to light and accommodation. No scleral icterus. Extraocular muscles intact.  HEENT: Head atraumatic, normocephalic. Oropharynx and nasopharynx clear.  NECK:  Supple, no jugular venous distention. No thyroid enlargement, no tenderness.  LUNGS: Normal breath sounds bilaterally, no wheezing, rales, rhonchi. No use of accessory muscles of respiration.  CARDIOVASCULAR: S1, S2 normal. No murmurs, rubs, or gallops.  ABDOMEN: Soft, nontender, nondistended. Bowel sounds present. No organomegaly or mass.  EXTREMITIES: No cyanosis, clubbing or edema b/l.    NEUROLOGIC: Cranial nerves II through XII are  intact. No focal Motor or sensory deficits b/l.   PSYCHIATRIC: The patient is alert and oriented x 1.  SKIN: No obvious rash, lesion, or ulcer.    LABORATORY PANEL:   CBC  Recent Labs Lab 05/20/17 0326  WBC 5.2  HGB 13.3  HCT 38.1*  PLT 298   ------------------------------------------------------------------------------------------------------------------  Chemistries   Recent Labs Lab 05/19/17 1501 05/20/17 0326  NA 131* 133*  K 4.4 3.8  CL 96* 100*  CO2 27 27  GLUCOSE 108* 102*  BUN 25* 21*  CREATININE 1.81* 1.31*  CALCIUM 9.6 9.0  AST 20  --   ALT 21  --   ALKPHOS 74  --   BILITOT 0.8  --    ------------------------------------------------------------------------------------------------------------------  Cardiac Enzymes No results for input(s): TROPONINI in the last 168 hours. ------------------------------------------------------------------------------------------------------------------  RADIOLOGY:  Dg Chest 2 View  Result Date: 05/19/2017 CLINICAL DATA:  Altered mental status EXAM: CHEST  2 VIEW COMPARISON:  May 05, 2017 FINDINGS: Lungs are clear. Heart size and pulmonary vascularity are normal. No adenopathy. No bone lesions. IMPRESSION: No edema or consolidation. Electronically Signed   By: Lowella Grip III M.D.   On: 05/19/2017 15:17   Ct Renal Stone Study  Result Date: 05/19/2017 CLINICAL DATA:  UTI EXAM: CT ABDOMEN AND PELVIS WITHOUT CONTRAST TECHNIQUE: Multidetector CT imaging of the abdomen and pelvis was performed following the standard protocol without IV contrast. COMPARISON:  04/21/2017, 04/14/2017 FINDINGS: Lower chest: Previously noted cavitary lesion in the left lower lobe is not identified. Linear scarring in the left lower lobe. No pleural effusion or acute consolidation. Coronary artery calcifications. Trace pericardial effusion or thickening. Hepatobiliary: Calcified  gallstones. No biliary dilatation. No focal hepatic abnormality  Pancreas: Unremarkable. No pancreatic ductal dilatation or surrounding inflammatory changes. Spleen: Normal in size without focal abnormality. Adrenals/Urinary Tract: Diffuse thickening of the adrenal glands without discrete mass. Atrophic right kidney. No hydronephrosis. Status post cystectomy with right lower quadrant ileal conduit. Stomach/Bowel: Stomach nonenlarged. No dilated small bowel. Large amount of stool in the colon. No colon wall thickening. Normal appendix. Postsurgical changes of right lower quadrant small bowel. Right lower quadrant ileostomy with parastomal hernia containing fat. Vascular/Lymphatic: Focal ectasia of the distal abdominal aorta measuring 2.8 cm. No significantly enlarged pelvic lymph nodes. Reproductive: Status post prostatectomy Other: No free air or free fluid. Moderate fat in the periumbilical region Musculoskeletal: Degenerative changes.  No acute osseus abnormality. IMPRESSION: 1. Status post cystoprostatectomy with right lower quadrant ileal conduit. Kidneys demonstrate no hydronephrosis or calcified stones. The right kidney is atrophic. 2. Large volume of stool in the colon 3. Calcified gallstones 4. Ectasia of the infrarenal abdominal aorta a distally measuring up to 2.8 cm. Ectatic abdominal aorta at risk for aneurysm development. Recommend followup by ultrasound in 5 years. This recommendation follows ACR consensus guidelines: White Paper of the ACR Incidental Findings Committee II on Vascular Findings. J Am Coll Radiol 2013; 10:789-794. 5. Previously noted left lower lobe cavitary lesion no longer visualized 6. Periumbilical and parastomal fat containing hernias. Electronically Signed   By: Donavan Foil M.D.   On: 05/19/2017 17:49     ASSESSMENT AND PLAN:   71 year old male with past medical history of bladder cancer status post ileal conduit who was recently admitted to the hospital for suspected UTI and altered mental status now returns back to the hospital due to  mental status change.  1. AMS/Lethargy - etiology unclear presently.  Unlikely this is a UTI as pt's UA is not that impressive.  - on his last hospitalization pt. Had CT head/MRI Brain which was negative.  -We'll DC IV antibiotics and urine cultures are negative. Appreciate neurology input discussed with Dr. Doy Mince and plan for EEG and possible LP if family agrees.  - will plan for outpatient follow up with [facility] and Dr. Doy Mince to help getting pt. And earlier appointment.   2. Acute kidney injury-secondary to poor by mouth intake and dehydration.  - cont. IV fluids and Cr. Improving.   3. COPD-no acute exacerbation. - cont. Incruse/Ellipta.   Discussed plan of care with patient's son at bedside and also with neurology. Greater than 50% of time spent in coordinating care.  All the records are reviewed and case discussed with Care Management/Social Worker. Management plans discussed with the patient, family and they are in agreement.  CODE STATUS: Full code  DVT Prophylaxis: Lovenox  TOTAL TIME TAKING CARE OF THIS PATIENT: 30 minutes.   POSSIBLE D/C IN 1-2 DAYS, DEPENDING ON CLINICAL CONDITION.   Henreitta Leber M.D on 05/21/2017 at 1:46 PM  Between 7am to 6pm - Pager - [phone]  After 6pm go to [email or web address] - [employer] [facility]  Office  [phone]  CC: Primary care physician; Burnard Hawthorne, FNP

## 2017-05-21 NOTE — Consult Note (Signed)
Reason for Consult:AMS Referring Physician: Verdell Carmine  CC: AMS  HPI: Rodney Day is an 71 y.o. male admitted 5/24 with AMS and presumed UTI.  Was treated and felt by wife to possibly be getting better.  Since returning home the patient has really not gotten better.  Saw his urologist on the day prior to admission who did not feel the patient had a UTI now or in the past and wanted the patient further evaluated.  Per wife patient has had no further decline since discharge but has not improve either.  He stumbles when he walks, sleeps a lot, has decreased appetite, weight loss, asks the same questions repeatedly and has decreased interest in previous activities and hygiene.  Is not incontinent.  On previous admission had a normal MRI of the brain, heavy metal screen was negative, B1, B12, RPR, TSH, cryoglobulin and copper are normal.  Was scheduled to see neurology on an outpatient basis but this is not scheduled until July.    Past Medical History:  Diagnosis Date  . Malignant neoplasm of bladder [facility])     Past Surgical History:  Procedure Laterality Date  . ELECTROMAGNETIC NAVIGATION BROCHOSCOPY N/A 05/01/2017   Procedure: ELECTROMAGNETIC NAVIGATION BRONCHOSCOPY;  Surgeon: Flora Lipps, MD;  Location: [facility] ORS;  Service: Cardiopulmonary;  Laterality: N/A;  . ERCP N/A 04/09/2016   Procedure: ENDOSCOPIC RETROGRADE CHOLANGIOPANCREATOGRAPHY (ERCP);  Surgeon: Hulen Luster, MD;  Location: [facility] ENDOSCOPY;  Service: Gastroenterology;  Laterality: N/A;  . Pr Cystectomy,Ileal Conduit/Sigmoid Bladder  05/13/2013   CYSTECTOMY, COMPLETE, WITH URETEROILEAL CONDUIT OR SIGMOID BLADDER, INCLUDING INTESTINE ANASTOMOSIS; Surgeon: Ronnald Nian, MD  . Pr Remove Pelvis Lymph Nodes  05/13/2013   Procedure: PELVIC LYMPHADENECTOMY W/EXT ILIAC (SEPART PROC); Surgeon: Ronnald Nian, MD  . TRANSURETHRAL RESECTION OF BLADDER TUMOR      Family History  Problem Relation Age of Onset  . Bladder Cancer Father   .  Prostate cancer Father   . COPD Father   . Stroke Paternal Uncle   . Dementia Mother   . Diabetes Mother     Social History:  reports that he has been smoking Cigarettes.  He has a 12.75 pack-year smoking history. He has never used smokeless tobacco. He reports that he drinks alcohol. He reports that he does not use drugs.  No Known Allergies  Medications:  I have reviewed the patient's current medications. Prior to Admission:  Prescriptions Prior to Admission  Medication Sig Dispense Refill Last Dose  . albuterol (PROVENTIL HFA;VENTOLIN HFA) 108 (90 Base) MCG/ACT inhaler Inhale 2 puffs into the lungs every 4 (four) hours as needed for wheezing or shortness of breath. 1 Inhaler 6 prn at prn  . ciprofloxacin (CIPRO) 500 MG tablet Take 500 mg by mouth 2 (two) times daily.    05/19/2017 at am  . Glycopyrrolate-Formoterol (BEVESPI AEROSPHERE) 9-4.8 MCG/ACT AERO Inhale 2 puffs into the lungs 2 (two) times daily. 10.7 g 5 Past Week at Unknown time  . ibuprofen (ADVIL,MOTRIN) 200 MG tablet Take 400 mg by mouth every 8 (eight) hours as needed for mild pain.    prn at prn  . Multiple Vitamin (MULTIVITAMIN WITH MINERALS) TABS tablet Take 1 tablet by mouth daily.   Past Month at Unknown time   Scheduled: . arformoterol  15 mcg Nebulization BID  . enoxaparin (LOVENOX) injection  40 mg Subcutaneous Q24H  . feeding supplement (ENSURE ENLIVE)  237 mL Oral TID BM  . umeclidinium bromide  1 puff Inhalation Daily  ROS: History obtained from wife  General ROS: as noted in HPI Psychological ROS: memory difficulties Ophthalmic ROS: negative for - blurry vision, double vision, eye pain or loss of vision ENT ROS: negative for - epistaxis, nasal discharge, oral lesions, sore throat, tinnitus or vertigo Allergy and Immunology ROS: negative for - hives or itchy/watery eyes Hematological and Lymphatic ROS: negative for - bleeding problems, bruising or swollen lymph nodes Endocrine ROS: negative for -  galactorrhea, hair pattern changes, polydipsia/polyuria or temperature intolerance Respiratory ROS: negative for - cough, hemoptysis, shortness of breath or wheezing Cardiovascular ROS: negative for - chest pain, dyspnea on exertion, edema or irregular heartbeat Gastrointestinal ROS: negative for - abdominal pain, diarrhea, hematemesis, nausea/vomiting or stool incontinence Genito-Urinary ROS: negative for - dysuria, hematuria, incontinence or urinary frequency/urgency Musculoskeletal ROS: negative for - joint swelling or muscular weakness Neurological ROS: as noted in HPI Dermatological ROS: negative for rash and skin lesion changes  Physical Examination: Blood pressure 106/63, pulse 79, temperature 98.4 F (36.9 C), temperature source Oral, resp. rate 18, height 6' (1.829 m), weight 79.4 kg (175 lb 1.6 oz), SpO2 96 %.  HEENT-  Normocephalic, no lesions, without obvious abnormality.  Normal external eye and conjunctiva.  Normal TM's bilaterally.  Normal auditory canals and external ears. Normal external nose, mucus membranes and septum.  Normal pharynx. Cardiovascular- S1, S2 normal, pulses palpable throughout   Lungs- chest clear, no wheezing, rales, normal symmetric air entry Abdomen- soft, non-tender; bowel sounds normal; no masses,  no organomegaly Extremities- no edema Lymph-no adenopathy palpable Musculoskeletal-no joint tenderness, deformity or swelling Skin-warm and dry, no hyperpigmentation, vitiligo, or suspicious lesions  Neurological Examination   Mental Status: Lethargic.  Oriented to name, year and family members in the room.  Surprised that it was June, thought it was February.  Speech fluent without evidence of aphasia.  Able to follow 3 step commands but requires some reinforcement. Cranial Nerves: II: Discs flat bilaterally; Visual fields grossly normal, pupils equal, round, reactive to light and accommodation III,IV, VI: ptosis not present, extra-ocular motions intact  bilaterally V,VII: smile symmetric, facial light touch sensation normal bilaterally VIII: hearing normal bilaterally IX,X: gag reflex present XI: bilateral shoulder shrug XII: midline tongue extension Motor: Right : Upper extremity   5/5    Left:     Upper extremity   5/5  Lower extremity   5/5     Lower extremity   5/5 Tone and bulk:normal tone throughout; no atrophy noted Sensory: Pinprick and light touch intact throughout, bilaterally.  Absent vibratory sensation to the sternum Deep Tendon Reflexes: 2+ in the upper extremities and absent in the lower extremities Plantars: Right: downgoing   Left: mute Cerebellar: Normal finger-to-nose and normal heel-to-shin testing bilaterally Gait: not tested due to safety concerns    Laboratory Studies:   Basic Metabolic Panel:  Recent Labs Lab 05/19/17 1501 05/20/17 0326  NA 131* 133*  K 4.4 3.8  CL 96* 100*  CO2 27 27  GLUCOSE 108* 102*  BUN 25* 21*  CREATININE 1.81* 1.31*  CALCIUM 9.6 9.0    Liver Function Tests:  Recent Labs Lab 05/19/17 1501  AST 20  ALT 21  ALKPHOS 74  BILITOT 0.8  PROT 7.7  ALBUMIN 4.2   No results for input(s): LIPASE, AMYLASE in the last 168 hours. No results for input(s): AMMONIA in the last 168 hours.  CBC:  Recent Labs Lab 05/19/17 1501 05/20/17 0326  WBC 6.4 5.2  NEUTROABS 3.8  --   HGB  14.6 13.3  HCT 41.3 38.1*  MCV 88.8 87.9  PLT 361 298    Cardiac Enzymes: No results for input(s): CKTOTAL, CKMB, CKMBINDEX, TROPONINI in the last 168 hours.  BNP: Invalid input(s): POCBNP  CBG: No results for input(s): GLUCAP in the last 168 hours.  Microbiology: Results for orders placed or performed during the hospital encounter of 05/19/17  Culture, blood (Routine x 2)     Status: None (Preliminary result)   Collection Time: 05/19/17  3:01 PM  Result Value Ref Range Status   Specimen Description BLOOD  RIGHT AC  Final   Special Requests BOTTLES DRAWN AEROBIC AND ANAEROBIC [place]   Final   Culture NO GROWTH 2 DAYS  Final   Report Status PENDING  Incomplete  Culture, blood (Routine x 2)     Status: None (Preliminary result)   Collection Time: 05/19/17  3:01 PM  Result Value Ref Range Status   Specimen Description BLOOD LEFT AC  Final   Special Requests BOTTLES DRAWN AEROBIC AND ANAEROBIC BCAV  Final   Culture NO GROWTH 2 DAYS  Final   Report Status PENDING  Incomplete  Urine culture     Status: None   Collection Time: 05/19/17  4:54 PM  Result Value Ref Range Status   Specimen Description URINE, RANDOM  Final   Special Requests NONE  Final   Culture   Final    NO GROWTH Performed at [facility], [place] [address] [place], [place] [address]    Report Status 05/21/2017 FINAL  Final    Coagulation Studies: No results for input(s): LABPROT, INR in the last 72 hours.  Urinalysis:  Recent Labs Lab 05/19/17 1634  COLORURINE YELLOW*  LABSPEC 1.013  PHURINE 6.0  GLUCOSEU NEGATIVE  HGBUR SMALL*  BILIRUBINUR NEGATIVE  KETONESUR NEGATIVE  PROTEINUR NEGATIVE  NITRITE NEGATIVE  LEUKOCYTESUR SMALL*    Lipid Panel:  No results found for: CHOL, TRIG, HDL, CHOLHDL, VLDL, LDLCALC  HgbA1C: No results found for: HGBA1C  Urine Drug Screen:  No results found for: LABOPIA, COCAINSCRNUR, LABBENZ, AMPHETMU, THCU, LABBARB  Alcohol Level: No results for input(s): ETH in the last 168 hours.   Imaging: Dg Chest 2 View  Result Date: 05/19/2017 CLINICAL DATA:  Altered mental status EXAM: CHEST  2 VIEW COMPARISON:  May 05, 2017 FINDINGS: Lungs are clear. Heart size and pulmonary vascularity are normal. No adenopathy. No bone lesions. IMPRESSION: No edema or consolidation. Electronically Signed   By: Lowella Grip III M.D.   On: 05/19/2017 15:17   Ct Renal Stone Study  Result Date: 05/19/2017 CLINICAL DATA:  UTI EXAM: CT ABDOMEN AND PELVIS WITHOUT CONTRAST TECHNIQUE: Multidetector CT imaging of the abdomen and pelvis was performed following the standard protocol  without IV contrast. COMPARISON:  04/21/2017, 04/14/2017 FINDINGS: Lower chest: Previously noted cavitary lesion in the left lower lobe is not identified. Linear scarring in the left lower lobe. No pleural effusion or acute consolidation. Coronary artery calcifications. Trace pericardial effusion or thickening. Hepatobiliary: Calcified gallstones. No biliary dilatation. No focal hepatic abnormality Pancreas: Unremarkable. No pancreatic ductal dilatation or surrounding inflammatory changes. Spleen: Normal in size without focal abnormality. Adrenals/Urinary Tract: Diffuse thickening of the adrenal glands without discrete mass. Atrophic right kidney. No hydronephrosis. Status post cystectomy with right lower quadrant ileal conduit. Stomach/Bowel: Stomach nonenlarged. No dilated small bowel. Large amount of stool in the colon. No colon wall thickening. Normal appendix. Postsurgical changes of right lower quadrant small bowel. Right lower quadrant ileostomy with parastomal  hernia containing fat. Vascular/Lymphatic: Focal ectasia of the distal abdominal aorta measuring 2.8 cm. No significantly enlarged pelvic lymph nodes. Reproductive: Status post prostatectomy Other: No free air or free fluid. Moderate fat in the periumbilical region Musculoskeletal: Degenerative changes.  No acute osseus abnormality. IMPRESSION: 1. Status post cystoprostatectomy with right lower quadrant ileal conduit. Kidneys demonstrate no hydronephrosis or calcified stones. The right kidney is atrophic. 2. Large volume of stool in the colon 3. Calcified gallstones 4. Ectasia of the infrarenal abdominal aorta a distally measuring up to 2.8 cm. Ectatic abdominal aorta at risk for aneurysm development. Recommend followup by ultrasound in 5 years. This recommendation follows ACR consensus guidelines: White Paper of the ACR Incidental Findings Committee II on Vascular Findings. J Am Coll Radiol 2013; 10:789-794. 5. Previously noted left lower lobe  cavitary lesion no longer visualized 6. Periumbilical and parastomal fat containing hernias. Electronically Signed   By: Donavan Foil M.D.   On: 05/19/2017 17:49     Assessment/Plan: 71 year old male with altered mental status.  Etiology unclear.  Patient did have some mild memory issues prior to this decline but was otherwise functioning normally.  Work up to date has been unremarkable.  Lab work does reveal some mild hyponatremia.  Patient also with AKI and what appears to be dehydration by examination.  In a patient with underlying mild cognitive issues this may be a factor.  Will rule out other possibilities as well.    Recommendations: 1.  EEG 2.  Have discussed possibillty of LP with family and they are discussing.   3.  Outpatient neurology evaluation moved up and patient to be seen in 1-2 weeks.    Alexis Goodell, MD Neurology [phone] 05/21/2017, 3:06 PM

## 2017-05-21 NOTE — Progress Notes (Signed)
PT Cancellation Note  Patient Details Name: Rodney Day MRN: [id number] DOB: May 08, 1946   Cancelled Treatment:    Reason Eval/Treat Not Completed: Other (comment): Chart reviewed. Evaluation was attempted this PM. Pt out of room for EEG. PT will re-attempt evaluation tomorrow.   Donaciano Eva, PT, SPT  [place] 05/21/2017, 4:12 PM

## 2017-05-22 ENCOUNTER — Ambulatory Visit: Payer: Medicare Other | Admitting: Oncology

## 2017-05-22 ENCOUNTER — Other Ambulatory Visit: Payer: Medicare Other

## 2017-05-22 LAB — BASIC METABOLIC PANEL
ANION GAP: 7 (ref 5–15)
BUN: 22 mg/dL — ABNORMAL HIGH (ref 6–20)
CHLORIDE: 103 mmol/L (ref 101–111)
CO2: 26 mmol/L (ref 22–32)
Calcium: 8.8 mg/dL — ABNORMAL LOW (ref 8.9–10.3)
Creatinine, Ser: 1.23 mg/dL (ref 0.61–1.24)
GFR calc Af Amer: >60 mL/min (ref >60)
GFR, EST NON AFRICAN AMERICAN: 58 mL/min — AB (ref >60)
GLUCOSE: 109 mg/dL — AB (ref 65–99)
POTASSIUM: 4.1 mmol/L (ref 3.5–5.1)
Sodium: 136 mmol/L (ref 135–145)

## 2017-05-22 LAB — AMMONIA: AMMONIA: 23 umol/L (ref 9–35)

## 2017-05-22 LAB — APTT: APTT: 34 s (ref 24–36)

## 2017-05-22 LAB — PROTIME-INR
INR: 1.01
Prothrombin Time: 13.3 seconds (ref 11.4–15.2)

## 2017-05-22 MED ORDER — METHYLPHENIDATE HCL 5 MG PO TABS
5.0000 mg | ORAL_TABLET | Freq: Two times a day (BID) | ORAL | Status: DC
  Administered 2017-05-22 – 2017-05-23 (×3): 5 mg via ORAL
  Filled 2017-05-22 (×3): qty 1

## 2017-05-22 NOTE — Evaluation (Signed)
Physical Therapy Evaluation Patient Details Name: Rodney Day MRN: [id number] DOB: [birth year] Today's Date: 05/22/2017   History of Present Illness  71 y/o male admitted on 05/19/2017 with complaints of confusion. Pt was previously admitted 10 days ago for a complicated UTI, which was treated with antibiotics before he was dc home. Pt's symptoms worsened and he has fallen 3x in the last week. Pt's wife states his mental status has been declining since March. UTI tests were negative this admission, so doctors are suspecting early onset dementia (tests pending). PMH includes bladder cancer, encephalopathy, acute on chronic kidney failure, and AKI).     Clinical Impression  Pt is a pleasant 71 year old admitted to ED for confusion and repeat falls. Pt was lethargic upon arrival and not oriented to the situation nor could he accurately state his birth date. Pt was oriented to his name and place. Pt performs bed mobility/transfers with supervision for pt safety and he ambulates with a RW and min guard. Pt able to ambulate 157ft this session with no LOB noted. Pt moderately unsteady on his feet, but he follows commands for safety and proper sequencing well. Would benefit from skilled PT to address above deficits and promote optimal return to PLOF.      Follow Up Recommendations Home health PT (Pt wife able to care for him at dc.)    Equipment Recommendations  None recommended by PT    Recommendations for Other Services       Precautions / Restrictions Precautions Precautions: Fall Restrictions Weight Bearing Restrictions: No      Mobility  Bed Mobility Overal bed mobility: Needs Assistance Bed Mobility: Supine to Sit     Supine to sit: Supervision     General bed mobility comments: Pt able to perform without physical assistance or cueing. Supervision for pt safety give dizziness at EOB.   Transfers Overall transfer level: Needs assistance Equipment used: None Transfers: Sit  to/from Stand Sit to Stand: Supervision         General transfer comment: Pt unsteady upon standing with posterior sway noted with UE movements once standing. Pt did not require verbal cueing for proper sequencing. Cueing for standing still once standing to prevent dizziness and reduce fall risk.  Ambulation/Gait Ambulation/Gait assistance: Min guard Ambulation Distance (Feet): 5 Feet Assistive device:  (Pt held IV pole while PT donned extra gown when standing.) Gait Pattern/deviations: Trunk flexed;Step-to pattern;Decreased step length - right;Decreased step length - left;Wide base of support     General Gait Details: Pt ambulated 70ft from EOB to recliner with min guard for safety. Pt unsteady and reached for furniture/IV pole to steady himself.   Stairs            Wheelchair Mobility    Modified Rankin (Stroke Patients Only)       Balance Overall balance assessment: Needs assistance Sitting-balance support: Feet supported   Sitting balance - Comments: Pt able to sit without UE assist without LOB. Pt able to reach with UE's cross-bodied outside his BOS without LOB with feet supported.   Standing balance support: Bilateral upper extremity supported (both hands on RW)   Standing balance comment: Pt tended to lose his balance posteriorly when bring his UE's in front of him (touching his face). However, pt could recover his balance from this disturbance without physical assistance. Pt had increased sway standing with mild unsteadiness noted.  Pertinent Vitals/Pain Pain Assessment: 0-10 Pain Score: 2  Pain Descriptors / Indicators:  (Pt states his body is sore when initially using UE's/LE's.) Pain Intervention(s): Monitored during session;Limited activity within patient's tolerance    Home Living Family/patient expects to be discharged to:: Private residence Living Arrangements: Spouse/significant other Available Help at  Discharge: Family Type of Home: House Home Access: Stairs to enter;Ramped entrance (Pt can enter home with either steps or ramped entrance.) Entrance Stairs-Rails: Can reach both Entrance Stairs-Number of Steps: 5 Home Layout: Able to live on main level with bedroom/bathroom;Two level Home Equipment: Walker - 2 wheels;Cane - single point;Bedside commode Additional Comments: Pt's wife states she is comfortable caring for him if dc home. PT educated her on purchasing a gait belt and using it for safety and fall prevention.    Prior Function Level of Independence: Independent with assistive device(s)               Hand Dominance        Extremity/Trunk Assessment   Upper Extremity Assessment Upper Extremity Assessment: Overall WFL for tasks assessed (grossly 5/5 for elbow flex/ext and grip)    Lower Extremity Assessment Lower Extremity Assessment: Overall WFL for tasks assessed (grossly 5/5 for DF/PF, knee flex/ext, and hip flex)       Communication   Communication: No difficulties  Cognition Arousal/Alertness: Lethargic Behavior During Therapy: Impulsive (Pt moved quickly, but followed commands for safety well) Overall Cognitive Status: Impaired/Different from baseline Area of Impairment: Orientation;Awareness                 Orientation Level:  (Pt able to state name/place. Unable to state bday/situation.)             General Comments: Pt very lethargic upon arrival to room. He struggled to keep his eyes open, but this improved as pt was mobilized. Pt followed commands very well and needed cueing for safety awareness. Pt's answers were short and often inaccurate (see above). Pt's wife able to provide accurate and thorough history.       General Comments      Exercises Other Exercises Other Exercises: Ambulated 185ft with RW and chair follow. Pt required one seated break while ambulating. SA O2 remained 98% after ambulation with mild SOB. Pt stated he felt  better after walking (less soreness/pain). Good speed noted initially prior to fatigue. Verbal cueing to keep walker closer to him and to use safe technique turning R/L.   Assessment/Plan    PT Assessment Patient needs continued PT services  PT Problem List Decreased activity tolerance;Decreased balance;Decreased cognition       PT Treatment Interventions Gait training;Stair training;Therapeutic exercise;Balance training;Cognitive remediation;Patient/family education    PT Goals (Current goals can be found in the Care Plan section)  Acute Rehab PT Goals Patient Stated Goal: to go home PT Goal Formulation: With patient/family Time For Goal Achievement: 06/05/17 Potential to Achieve Goals: Good Additional Goals Additional Goal #1: Will perform bed mobility/transfers with mod I and safety awareness to prevent fall at home.    Frequency Min 2X/week   Barriers to discharge        Co-evaluation               AM-PAC PT "6 Clicks" Daily Activity  Outcome Measure Difficulty turning over in bed (including adjusting bedclothes, sheets and blankets)?: None Difficulty moving from lying on back to sitting on the side of the bed? : None Difficulty sitting down on and standing up from a chair  with arms (e.g., wheelchair, bedside commode, etc,.)?: None Help needed moving to and from a bed to chair (including a wheelchair)?: None Help needed walking in hospital room?: A Little Help needed climbing 3-5 steps with a railing? : A Little 6 Click Score: 22    End of Session Equipment Utilized During Treatment: Gait belt Activity Tolerance: Patient limited by fatigue Patient left: in chair;with call bell/phone within reach;with chair alarm set;with family/visitor present Nurse Communication: Mobility status PT Visit Diagnosis: Muscle weakness (generalized) (M62.81);Repeated falls (R29.6);Other abnormalities of gait and mobility (R26.89);History of falling (Z91.81)    Time: 6440-3474 PT  Time Calculation (min) (ACUTE ONLY): 29 min   Charges:   PT Evaluation $PT Eval Low Complexity: 1 Procedure PT Treatments $Gait Training: 8-22 mins   PT G Codes:        Donaciano Eva, PT, SPT  Marni Griffon 05/22/2017, 3:17 PM

## 2017-05-22 NOTE — Care Management Note (Signed)
Case Management Note  Patient Details  Name: Rodney Day MRN: [id number] Date of Birth: [birth year]  Subjective/Objective:  Admitted to this facility with the diagnosis of urinary tract infection.      Lives with wife, Meredith Mody [phone]). Last seen Dr. Nena Polio 05/19/17. Prescriptions are filled at [facility] in [place]. No home Health. No skilled facility. Rolling walker, bedside commode, and cane in the home. Self feed. Needs assistance with baths and dressing. Decreased appetite. Lost 15 pounds since May. Diagnosed with bladder Cancer October 2013. Surgery at [facility] 2014. Finished Chemotherapy 2014.              Action/Plan:  Physical therapy evaluation pending. EEG completed. Neurology and Urology in progress.   Expected Discharge Date:                  Expected Discharge Plan:     In-House Referral:     Discharge planning Services   pending  Post Acute Care Choice:    Choice offered to:     DME Arranged:    DME Agency:     HH Arranged:    HH Agency:     Status of Service:   in progress  If discussed at [employer] of Stay Meetings, dates discussed:    Additional Comments:  Shelbie Ammons, RN MSN CCM Care Management [phone] 05/22/2017, 9:12 AM

## 2017-05-22 NOTE — NC FL2 (Signed)
[place] LEVEL OF CARE SCREENING TOOL     IDENTIFICATION  Patient Name: Rodney Day Birthdate: [birth year] Sex: male Admission Date (Current Location): 05/19/2017  [place] and [place] Number:  Engineering geologist and Address:  [facility], [address] [place], [place] [address]      Provider Number: [id number]  Attending Physician Name and Address:  Henreitta Leber, MD  Relative Name and Phone Number:       Current Level of Care: SNF Recommended Level of Care: [place] Prior Approval Number:    Date Approved/Denied:   PASRR Number: [id number]  Discharge Plan: SNF    Current Diagnoses: Patient Active Problem List   Diagnosis Date Noted  . AKI (acute kidney injury) ([place])   . Encephalopathy 05/19/2017  . Acute on chronic kidney failure ([place]) 05/19/2017  . Complicated UTI (urinary tract infection) 05/05/2017  . Adenopathy   . Lung mass 04/16/2017  . Cholelithiasis with cholecystitis or obstruction 03/29/2016  . Bladder cancer ([place]) 05/25/2015    Orientation RESPIRATION BLADDER Height & Weight     Self, Time, Situation, Place  Normal Continent Weight: 175 lb 1.6 oz (79.4 kg) Height:  6' (182.9 cm)  BEHAVIORAL SYMPTOMS/MOOD NEUROLOGICAL BOWEL NUTRITION STATUS      Continent Diet (Regular Diet)  AMBULATORY STATUS COMMUNICATION OF NEEDS Skin   Limited Assist Verbally Normal                       Personal Care Assistance Level of Assistance  Bathing, Feeding, Dressing Bathing Assistance: Limited assistance Feeding assistance: Independent Dressing Assistance: Limited assistance     Functional Limitations Info  Sight, Hearing, Speech Sight Info: Adequate Hearing Info: Adequate Speech Info: Adequate    SPECIAL CARE FACTORS FREQUENCY  PT (By licensed PT)     PT Frequency: 5              Contractures Contractures Info: Not present    Additional Factors Info  Code Status,  Allergies Code Status Info: Full Code Allergies Info: No Know Allergies           Current Medications (05/22/2017):  This is the current hospital active medication list Current Facility-Administered Medications  Medication Dose Route Frequency Provider Last Rate Last Dose  . 0.9 %  sodium chloride infusion   Intravenous Continuous Henreitta Leber, MD 125 mL/hr at 05/22/17 8921    . acetaminophen (TYLENOL) tablet 650 mg  650 mg Oral Q6H PRN Lance Coon, MD       Or  . acetaminophen (TYLENOL) suppository 650 mg  650 mg Rectal Q6H PRN Lance Coon, MD      . arformoterol [facility]) nebulizer solution 15 mcg  15 mcg Nebulization BID Lance Coon, MD   15 mcg at 05/22/17 [phone]  . enoxaparin (LOVENOX) injection 40 mg  40 mg Subcutaneous Q24H Lance Coon, MD   40 mg at 05/21/17 2327  . feeding supplement (ENSURE ENLIVE) (ENSURE ENLIVE) liquid 237 mL  237 mL Oral TID BM Henreitta Leber, MD   237 mL at 05/22/17 0833  . ondansetron (ZOFRAN) tablet 4 mg  4 mg Oral Q6H PRN Lance Coon, MD       Or  . ondansetron [facility]) injection 4 mg  4 mg Intravenous Q6H PRN Lance Coon, MD      . oxyCODONE (Oxy IR/ROXICODONE) immediate release tablet 5 mg  5 mg Oral Q4H PRN Lance Coon, MD      .  umeclidinium bromide (INCRUSE ELLIPTA) 62.5 MCG/INH 1 puff  1 puff Inhalation Daily Lance Coon, MD   1 puff at 05/22/17 8325     Discharge Medications: Please see discharge summary for a list of discharge medications.  Relevant Imaging Results:  Relevant Lab Results:   Additional Information SSN:  [id number]  Darden Dates, LCSW

## 2017-05-22 NOTE — Progress Notes (Signed)
Subjective: Patient unchanged.    Objective: Current vital signs: BP 113/62 (BP Location: Right Arm)   Pulse 72   Temp 98.1 F (36.7 C) (Oral)   Resp 20   Ht 6' (1.829 m)   Wt 79.4 kg (175 lb 1.6 oz)   SpO2 98%   BMI 23.75 kg/m  Vital signs in last 24 hours: Temp:  [98 F (36.7 C)-98.4 F (36.9 C)] 98.1 F (36.7 C) (06/08 1209) Pulse Rate:  [68-79] 72 (06/08 1209) Resp:  [14-20] 20 (06/08 1209) BP: (87-113)/(47-66) 113/62 (06/08 1209) SpO2:  [95 %-99 %] 98 % (06/08 1209)  Intake/Output from previous day: 06/07 0701 - 06/08 0700 In: 1653 [P.O.:480; I.V.:936] Out: 1250 [Urine:1250] Intake/Output this shift: Total I/O In: 1432.9 [P.O.:360; I.V.:1072.9] Out: 250 [Urine:250] Nutritional status: Diet regular Room service appropriate? Yes; Fluid consistency: Thin  Neurologic Exam: Mental Status: Lethargic.  Oriented to name, year and family members in the room.  Surprised that it was June, thought it was February.  Speech fluent without evidence of aphasia.  Able to follow 3 step commands but requires some reinforcement. Cranial Nerves: II: Discs flat bilaterally; Visual fields grossly normal, pupils equal, round, reactive to light and accommodation III,IV, VI: ptosis not present, extra-ocular motions intact bilaterally V,VII: smile symmetric, facial light touch sensation normal bilaterally VIII: hearing normal bilaterally IX,X: gag reflex present XI: bilateral shoulder shrug XII: midline tongue extension Motor: Right :  Upper extremity   5/5                                      Left:     Upper extremity   5/5             Lower extremity   5/5                                                  Lower extremity   5/5 Tone and bulk:normal tone throughout; no atrophy noted Sensory: Pinprick and light touch intact throughout, bilaterally. Absent vibratory sensation to the sternum Deep Tendon Reflexes: 2+ in the upper extremities and absent in the lower extremities   Lab  Results: Basic Metabolic Panel:  Recent Labs Lab 05/19/17 1501 05/20/17 0326 05/22/17 0401  NA 131* 133* 136  K 4.4 3.8 4.1  CL 96* 100* 103  CO2 27 27 26   GLUCOSE 108* 102* 109*  BUN 25* 21* 22*  CREATININE 1.81* 1.31* 1.23  CALCIUM 9.6 9.0 8.8*    Liver Function Tests:  Recent Labs Lab 05/19/17 1501  AST 20  ALT 21  ALKPHOS 74  BILITOT 0.8  PROT 7.7  ALBUMIN 4.2   No results for input(s): LIPASE, AMYLASE in the last 168 hours.  Recent Labs Lab 05/22/17 1013  AMMONIA 23    CBC:  Recent Labs Lab 05/19/17 1501 05/20/17 0326  WBC 6.4 5.2  NEUTROABS 3.8  --   HGB 14.6 13.3  HCT 41.3 38.1*  MCV 88.8 87.9  PLT 361 298    Cardiac Enzymes: No results for input(s): CKTOTAL, CKMB, CKMBINDEX, TROPONINI in the last 168 hours.  Lipid Panel: No results for input(s): CHOL, TRIG, HDL, CHOLHDL, VLDL, LDLCALC in the last 168 hours.  CBG: No results for input(s): GLUCAP in the last 168  hours.  Microbiology: Results for orders placed or performed during the hospital encounter of 05/19/17  Culture, blood (Routine x 2)     Status: None (Preliminary result)   Collection Time: 05/19/17  3:01 PM  Result Value Ref Range Status   Specimen Description BLOOD  RIGHT AC  Final   Special Requests BOTTLES DRAWN AEROBIC AND ANAEROBIC [place]  Final   Culture NO GROWTH 3 DAYS  Final   Report Status PENDING  Incomplete  Culture, blood (Routine x 2)     Status: None (Preliminary result)   Collection Time: 05/19/17  3:01 PM  Result Value Ref Range Status   Specimen Description BLOOD LEFT AC  Final   Special Requests BOTTLES DRAWN AEROBIC AND ANAEROBIC BCAV  Final   Culture NO GROWTH 3 DAYS  Final   Report Status PENDING  Incomplete  Urine culture     Status: None   Collection Time: 05/19/17  4:54 PM  Result Value Ref Range Status   Specimen Description URINE, RANDOM  Final   Special Requests NONE  Final   Culture   Final    NO GROWTH Performed at [facility],  [place] [address] [place], [place] [address]    Report Status 05/21/2017 FINAL  Final    Coagulation Studies:  Recent Labs  05/22/17 1100  LABPROT 13.3  INR 1.01    Imaging: No results found.  Medications:  I have reviewed the patient's current medications. Scheduled: . arformoterol  15 mcg Nebulization BID  . feeding supplement (ENSURE ENLIVE)  237 mL Oral TID BM  . methylphenidate  5 mg Oral BID  . umeclidinium bromide  1 puff Inhalation Daily    Assessment/Plan: Patient stable and unchanged.  EEG shows some triphasic activity but otherwise unremarkable.  Renal function improved.  Discussed condition with family.  Consent obtained for LP.  Will also start atimulants to see if it will help with alertness and appetite.    Recommendations: 1.  Ritalin 5mg  BID 2.  LP in AM.  Lovenox to be held  Case discussed with Dr. Verdell Carmine   LOS: 3 days   Alexis Goodell, MD Neurology [phone] 05/22/2017  1:13 PM

## 2017-05-22 NOTE — Care Management Important Message (Signed)
Important Message  Patient Details  Name: Rodney Day MRN: [id number] Date of Birth: [birth year]   Medicare Important Message Given:  Yes    Shelbie Ammons, RN 05/22/2017, 8:58 AM

## 2017-05-22 NOTE — Care Management (Signed)
Physical therapy evaluation completed. Discussed agencies with wife at the bedside. [place]. Will update Floydene Flock, [place] representative updated.  Lumbar puncture will be scheduled for Saturday. Shelbie Ammons RN MSN CCM Care Management [phone]

## 2017-05-22 NOTE — Progress Notes (Signed)
Rodney Day at [place] NAME: Rodney Day    MR#:  [id number]  DATE OF BIRTH:  30-Apr-1946  SUBJECTIVE:   Pt. Here due to Altered mental status and suspected UTI. UTI has been ruled out. Continues to be very sleepy. Wife at bedside. EEG yesterday did not show any evidence of seizures. Plan for lumbar puncture tomorrow.  REVIEW OF SYSTEMS:    Review of Systems  Constitutional: Negative for chills and fever.  HENT: Negative for congestion and tinnitus.   Eyes: Negative for blurred vision and double vision.  Respiratory: Negative for cough, shortness of breath and wheezing.   Cardiovascular: Negative for chest pain, orthopnea and PND.  Gastrointestinal: Negative for abdominal pain, diarrhea, nausea and vomiting.  Genitourinary: Negative for dysuria and hematuria.  Neurological: Negative for dizziness, sensory change and focal weakness.  All other systems reviewed and are negative.   Nutrition: Heart Healthy Tolerating Diet: Yes Tolerating PT: Eval noted.    DRUG ALLERGIES:  No Known Allergies  VITALS:  Blood pressure 113/62, pulse 72, temperature 98.1 F (36.7 C), temperature source Oral, resp. rate 20, height 6' (1.829 m), weight 79.4 kg (175 lb 1.6 oz), SpO2 98 %.  PHYSICAL EXAMINATION:   Physical Exam  GENERAL:  71 y.o.-year-old patient lying in bed lethargic and sleepy.  EYES: Pupils equal, round, reactive to light and accommodation. No scleral icterus. Extraocular muscles intact.  HEENT: Head atraumatic, normocephalic. Oropharynx and nasopharynx clear.  NECK:  Supple, no jugular venous distention. No thyroid enlargement, no tenderness.  LUNGS: Normal breath sounds bilaterally, no wheezing, rales, rhonchi. No use of accessory muscles of respiration.  CARDIOVASCULAR: S1, S2 normal. No murmurs, rubs, or gallops.  ABDOMEN: Soft, nontender, nondistended. Bowel sounds present. No organomegaly or mass.  EXTREMITIES: No cyanosis, clubbing  or edema b/l.    NEUROLOGIC: Cranial nerves II through XII are intact. No focal Motor or sensory deficits b/l. Globally weak.    PSYCHIATRIC: The patient is alert and oriented x 1.  SKIN: No obvious rash, lesion, or ulcer.    LABORATORY PANEL:   CBC  Recent Labs Lab 05/20/17 0326  WBC 5.2  HGB 13.3  HCT 38.1*  PLT 298   ------------------------------------------------------------------------------------------------------------------  Chemistries   Recent Labs Lab 05/19/17 1501  05/22/17 0401  NA 131*  < > 136  K 4.4  < > 4.1  CL 96*  < > 103  CO2 27  < > 26  GLUCOSE 108*  < > 109*  BUN 25*  < > 22*  CREATININE 1.81*  < > 1.23  CALCIUM 9.6  < > 8.8*  AST 20  --   --   ALT 21  --   --   ALKPHOS 74  --   --   BILITOT 0.8  --   --   < > = values in this interval not displayed. ------------------------------------------------------------------------------------------------------------------  Cardiac Enzymes No results for input(s): TROPONINI in the last 168 hours. ------------------------------------------------------------------------------------------------------------------  RADIOLOGY:  No results found.   ASSESSMENT AND PLAN:   71 year old male with past medical history of bladder cancer status post ileal conduit who was recently admitted to the hospital for suspected UTI and altered mental status now returns back to the hospital due to mental status change.  1. AMS/Lethargy - etiology unclear presently.  Unlikely this is a UTI as it's been ruled out. Off IV abx.   - on his last hospitalization pt. Had CT head/MRI Brain which was negative.  -  Appreciate neurology input discussed with Dr. Doy Day and EEG is (-) for seizures. Plan for LP tomorrow.  - will plan for outpatient follow up with [facility] and Dr. Doy Day to help getting pt. And earlier appointment.  - started on low dose Ritalin by Neuro today.   2. Acute kidney injury-secondary to poor by  mouth intake and dehydration.  - improving w/ IV fluids.   3. COPD-no acute exacerbation. - cont. Incruse/Ellipta.   Pt. Recommending Home with Home health.   Discussed plan of care with patient's wife at bedside and also with neurology.   All the records are reviewed and case discussed with Care Management/Social Worker. Management plans discussed with the patient, family and they are in agreement.  CODE STATUS: Full code  DVT Prophylaxis: Lovenox  TOTAL TIME TAKING CARE OF THIS PATIENT: 30 minutes.   POSSIBLE D/C IN 1-2 DAYS, DEPENDING ON CLINICAL CONDITION.   Rodney Day M.D on 05/22/2017 at 1:01 PM  Between 7am to 6pm - Pager - [phone]  After 6pm go to [email or web address] - Rodney Day [facility]  Rodney  Day  CC: Primary care physician; Rodney Hawthorne, FNP

## 2017-05-23 LAB — PROTEIN AND GLUCOSE, CSF
Glucose, CSF: 56 mg/dL (ref 40–70)
TOTAL PROTEIN, CSF: 92 mg/dL — AB (ref 15–45)

## 2017-05-23 LAB — CSF CELL COUNT WITH DIFFERENTIAL
Eosinophils, CSF: 0 %
Lymphs, CSF: 0 %
Monocyte-Macrophage-Spinal Fluid: 0 %
Other Cells, CSF: 0
RBC COUNT CSF: 11 /mm3 — AB (ref 0–3)
SEGMENTED NEUTROPHILS-CSF: 0 %
Tube #: 3
WBC CSF: 0 /mm3 (ref 0–5)

## 2017-05-23 MED ORDER — AMPHETAMINE-DEXTROAMPHETAMINE 10 MG PO TABS
10.0000 mg | ORAL_TABLET | Freq: Two times a day (BID) | ORAL | 0 refills | Status: DC
Start: 2017-05-23 — End: 2017-05-23

## 2017-05-23 MED ORDER — ENSURE ENLIVE PO LIQD: 237.0000 mL | Freq: Three times a day (TID) | ORAL | 0 refills | Status: DC

## 2017-05-23 MED ORDER — METHYLPHENIDATE HCL 10 MG PO TABS: 10.0000 mg | ORAL_TABLET | Freq: Two times a day (BID) | ORAL | 0 refills | Status: DC

## 2017-05-23 NOTE — Procedures (Signed)
LP Procedure Note:  Patient has been seen and examined.  Chart has been reviewed.  LP is being performed to rule out autoimmune degenerative disorder.  Procedure has been explained to patient/family including risks and benefits.  Consent has been signed by patient/family and witnessed. A time out was performed. Wife reports that she feels he is having some benefit from the Ritalin and was a little more awake on yesterday.  Was still confused overnight.    Blood pressure 119/68, pulse 66, temperature 98.3 F (36.8 C), temperature source Oral, resp. rate 20, height 6' (1.829 m), weight 79.4 kg (175 lb 1.6 oz), SpO2 99 %.   Current Facility-Administered Medications:  .  0.9 %  sodium chloride infusion, , Intravenous, Continuous, Wieting, Richard, MD, Last Rate: 40 mL/hr at 05/23/17 0803 .  acetaminophen (TYLENOL) tablet 650 mg, 650 mg, Oral, Q6H PRN **OR** acetaminophen (TYLENOL) suppository 650 mg, 650 mg, Rectal, Q6H PRN, Lance Coon, MD .  arformoterol [facility]) nebulizer solution 15 mcg, 15 mcg, Nebulization, BID, Lance Coon, MD, 15 mcg at 05/23/17 0758 .  feeding supplement (ENSURE ENLIVE) (ENSURE ENLIVE) liquid 237 mL, 237 mL, Oral, TID BM, Sainani, Belia Heman, MD, 237 mL at 05/23/17 1024 .  methylphenidate (RITALIN) tablet 5 mg, 5 mg, Oral, BID, Alexis Goodell, MD, 5 mg at 05/23/17 1024 .  ondansetron (ZOFRAN) tablet 4 mg, 4 mg, Oral, Q6H PRN **OR** ondansetron (ZOFRAN) injection 4 mg, 4 mg, Intravenous, Q6H PRN, Lance Coon, MD .  oxyCODONE (Oxy IR/ROXICODONE) immediate release tablet 5 mg, 5 mg, Oral, Q4H PRN, Lance Coon, MD .  umeclidinium bromide (INCRUSE ELLIPTA) 62.5 MCG/INH 1 puff, 1 puff, Inhalation, Daily, Lance Coon, MD, 1 puff at 05/23/17 1000   Recent Labs  05/22/17 1100  INR 1.01    MR brain (05/08/17): Normal MRI of the brain for age. No intracranial or calvarial metastatic disease.  Patient was placed in the lateral decub/sitting position.  Area was cleaned  with betadine and anesthetized with lidocaine.  Under sterile conditions 20G LP needle was placed at approximately L3-4 without difficulty.  Opening pressure was documented at 14.  Approximately 12cc of clear and colorless fluid was obtained and sent for studies.  No complications were noted.    Patient to continue Ritalin at current dose.  To change timing to 0800 and 1400 daily.  To see Dr. Jaynee Eagles on an outpatient basis where CSF results can be followed up.    Alexis Goodell, MD Neurology [phone] 05/23/2017  11:51 AM

## 2017-05-23 NOTE — Care Management Note (Signed)
Case Management Note  Patient Details  Name: Nox Talent MRN: [id number] Date of Birth: August 26, 1946  Subjective/Objective:    Mr Krenz had already chosen [place] during discharge planning with his weekday case manager. A referral was called to Melene Muller at [facility] with a referral for HH-PT, OT, RN,, Aide, SW. Someone from [facility] will call Mr Wassink to set up an appointment within 24-48 hours.                 Action/Plan:   Expected Discharge Date:  05/23/17               Expected Discharge Plan:   05/23/17  In-House Referral:     Discharge planning Services     Post Acute Care Choice:   Yes Choice offered to:   Patient  DME Arranged:   NA DME Agency:   NA  HH Arranged:   PT, OT, RN, Aide, [place] Agency:    [facility] Status of Service:   Completed.  If discussed at [place] of Stay Meetings, dates discussed:    Additional Comments:  Denean Pavon A, RN 05/23/2017, 1:12 PM

## 2017-05-23 NOTE — Discharge Summary (Signed)
[place] [facility] [place] NAME: Rodney Day    MR#:  [id number]  [place] OF BIRTH:  02/16/1946  DATE OF ADMISSION:  05/19/2017 ADMITTING PHYSICIAN: Lance Coon, MD  DATE OF DISCHARGE: 05/23/2017  PRIMARY CARE PHYSICIAN: Burnard Hawthorne, FNP    ADMISSION DIAGNOSIS:  Encephalopathy [G93.40] Acute cystitis with hematuria [N30.01] AKI (acute kidney injury) ([place]) [N17.9]  DISCHARGE DIAGNOSIS:  Acute encephalopathy  SECONDARY DIAGNOSIS:   Past Medical History:  Diagnosis Date  . Malignant neoplasm of bladder ([place])     HOSPITAL COURSE:   1. Acute encephalopathy and lethargy. Patient seen in consultation by neurology. Lumbar puncture was performed and results are still pending at this point. Will need follow-up with [facility] neurology as outpatient. The patient was started on low-dose Ritalin to try to improve waking status. At this point it is not believed to be secondary to infection. Patient has had problems with memory in the past and could be progressive dementia and/or delirium. The patient's wife would like to take him home with home health. The patient does wake up and does answer some questions but falls back asleep easily. Previous MRI of the brain was negative. EEG shows triphasic activity but otherwise unremarkable.  Again lumbar puncture results need to be followed up as outpatient. 2. Acute kidney injury on chronic kidney disease stage III. Patient improved with IV fluid hydration. Patient at high risk for dehydration again with his mental status. 3. History of COPD. Continue usual inhalers 4. History of tobacco abuse but has not smoked the last month 5. History of bladder cancer 6. Weakness. Physical therapy recommended home with home health.  DISCHARGE CONDITIONS:   Fair  CONSULTS OBTAINED:  Treatment Team:  Hollice Espy, MD Catarina Hartshorn, MD Alexis Goodell, MD  DRUG ALLERGIES:  No Known Allergies  DISCHARGE  MEDICATIONS:   Current Discharge Medication List    START taking these medications   Details  feeding supplement, ENSURE ENLIVE, (ENSURE ENLIVE) LIQD Take 237 mLs by mouth 3 (three) times daily between meals. Qty: 90 Bottle, Refills: 0    methylphenidate (RITALIN) 10 MG tablet Take 1 tablet (10 mg total) by mouth 2 (two) times daily with breakfast and lunch. Qty: 60 tablet, Refills: 0      CONTINUE these medications which have NOT CHANGED   Details  albuterol (PROVENTIL HFA;VENTOLIN HFA) 108 (90 Base) MCG/ACT inhaler Inhale 2 puffs into the lungs every 4 (four) hours as needed for wheezing or shortness of breath. Qty: 1 Inhaler, Refills: 6    Glycopyrrolate-Formoterol (BEVESPI AEROSPHERE) 9-4.8 MCG/ACT AERO Inhale 2 puffs into the lungs 2 (two) times daily. Qty: 10.7 g, Refills: 5    ibuprofen (ADVIL,MOTRIN) 200 MG tablet Take 400 mg by mouth every 8 (eight) hours as needed for mild pain.    Associated Diagnoses: Malignant neoplasm of urinary bladder, unspecified site (HCC)    Multiple Vitamin (MULTIVITAMIN WITH MINERALS) TABS tablet Take 1 tablet by mouth daily.      STOP taking these medications     ciprofloxacin (CIPRO) 500 MG tablet          DISCHARGE INSTRUCTIONS:   Follow-up PMD one week Follow-up [place] neurology 2 weeks  If you experience worsening of your admission symptoms, develop shortness of breath, life threatening emergency, suicidal or homicidal thoughts you must seek medical attention immediately by calling 911 or calling your MD immediately  if symptoms less severe.  You Must read complete instructions/literature along with all the  possible adverse reactions/side effects for all the Medicines you take and that have been prescribed to you. Take any new Medicines after you have completely understood and accept all the possible adverse reactions/side effects.   Please note  You were cared for by a hospitalist during your hospital stay. If you have any  questions about your discharge medications or the care you received while you were in the hospital after you are discharged, you can call the unit and asked to speak with the hospitalist on call if the hospitalist that took care of you is not available. Once you are discharged, your primary care physician will handle any further medical issues. Please note that NO REFILLS for any discharge medications will be authorized once you are discharged, as it is imperative that you return to your primary care physician (or establish a relationship with a primary care physician if you do not have one) for your aftercare needs so that they can reassess your need for medications and monitor your lab values.    Today   CHIEF COMPLAINT:   Chief Complaint  Patient presents with  . Altered Mental Status    HISTORY OF PRESENT ILLNESS:  Rodney Day  is a 71 y.o. male brought in with altered mental status.   VITAL SIGNS:  Blood pressure 119/68, pulse 66, temperature 98.3 F (36.8 C), temperature source Oral, resp. rate 20, height 6' (1.829 m), weight 79.4 kg (175 lb 1.6 oz), SpO2 99 %.   PHYSICAL EXAMINATION:  GENERAL:  71 y.o.-year-old patient lying in the bed with no acute distress.  EYES: Pupils equal, round, reactive to light and accommodation. No scleral icterus. Extraocular muscles intact.  HEENT: Head atraumatic, normocephalic. Oropharynx and nasopharynx clear.  NECK:  Supple, no jugular venous distention. No thyroid enlargement, no tenderness.  LUNGS: Normal breath sounds bilaterally, no wheezing, rales,rhonchi or crepitation. No use of accessory muscles of respiration.  CARDIOVASCULAR: S1, S2 normal. No murmurs, rubs, or gallops.  ABDOMEN: Soft, non-tender, non-distended. Bowel sounds present. No organomegaly or mass.  EXTREMITIES: Trace edema, no cyanosis, or clubbing.  NEUROLOGIC: Cranial nerves II through XII are intact. Muscle strength 5/5 in all extremities. Sensation intact.  PSYCHIATRIC:  The patient is alert and answers questions appropriately when I woke him up.  SKIN: No obvious rash, lesion, or ulcer.   DATA REVIEW:   CBC  Recent Labs Lab 05/20/17 0326  WBC 5.2  HGB 13.3  HCT 38.1*  PLT 298    Chemistries   Recent Labs Lab 05/19/17 1501  05/22/17 0401  NA 131*  < > 136  K 4.4  < > 4.1  CL 96*  < > 103  CO2 27  < > 26  GLUCOSE 108*  < > 109*  BUN 25*  < > 22*  CREATININE 1.81*  < > 1.23  CALCIUM 9.6  < > 8.8*  AST 20  --   --   ALT 21  --   --   ALKPHOS 74  --   --   BILITOT 0.8  --   --   < > = values in this interval not displayed.   Microbiology Results  Results for orders placed or performed during the hospital encounter of 05/19/17  Culture, blood (Routine x 2)     Status: None (Preliminary result)   Collection Time: 05/19/17  3:01 PM  Result Value Ref Range Status   Specimen Description BLOOD  RIGHT [facility]  Final   Special Requests BOTTLES DRAWN  AEROBIC AND ANAEROBIC [facility]  Final   Culture NO GROWTH 4 DAYS  Final   Report Status PENDING  Incomplete  Culture, blood (Routine x 2)     Status: None (Preliminary result)   Collection Time: 05/19/17  3:01 PM  Result Value Ref Range Status   Specimen Description BLOOD LEFT AC  Final   Special Requests BOTTLES DRAWN AEROBIC AND ANAEROBIC BCAV  Final   Culture NO GROWTH 4 DAYS  Final   Report Status PENDING  Incomplete  Urine culture     Status: None   Collection Time: 05/19/17  4:54 PM  Result Value Ref Range Status   Specimen Description URINE, RANDOM  Final   Special Requests NONE  Final   Culture   Final    NO GROWTH Performed at [facility], [place] [address] [place], [place] [address]    Report Status 05/21/2017 FINAL  Final     Management plans discussed with the patient, family and they are in agreement.  CODE STATUS:     Code Status Orders        Start     Ordered   05/19/17 2301  Full code  Continuous     05/19/17 2300    Code Status History    Date Active Date  Inactive Code Status Order ID Comments User Context   05/05/2017 10:49 AM 05/08/2017  7:45 PM Full Code [id number]  Loletha Grayer, MD ED   03/29/2016 11:24 AM 03/31/2016  7:25 PM Full Code [id number]  Hubbard Robinson, MD ED    Advance Directive Documentation     Most Recent Value  Type of Advance Directive  Healthcare Power of Attorney  Pre-existing out of facility DNR order (yellow form or pink MOST form)  -  "MOST" Form in Place?  -      TOTAL TIME TAKING CARE OF THIS PATIENT: 35 minutes.    Loletha Grayer M.D on 05/23/2017 at 2:05 PM  Between 7am to 6pm - Pager - [phone]  After 6pm go to [email or web address] - password [email or web address]  [employer] Office  [phone]  CC: Primary care physician; Burnard Hawthorne, FNP

## 2017-05-23 NOTE — Progress Notes (Signed)
MD order received in [facility] to discharge pt home today with home health PT, OT, RN, Aide and SW; Care Management previously established home health services with [facility]; verbally reviewed AVS with pt and pt's spouse, gave Rxs to pt's spouse; no questions voiced at this time; pt discharged via wheelchair by nursing to the visitor's entrance

## 2017-05-23 NOTE — Discharge Instructions (Signed)
[facility], Occupational Therapy, RN, Aide and Education officer, museum

## 2017-05-23 NOTE — Clinical Social Work Note (Signed)
CSW saw via chart review that PT has recommended home health. CSW is signing off. Please consult should the level of care needs escalate or any CSW related needs arise.  Santiago Bumpers, MSW, Latanya Presser [phone]

## 2017-05-24 LAB — CULTURE, BLOOD (ROUTINE X 2)
CULTURE: NO GROWTH
Culture: NO GROWTH

## 2017-05-25 ENCOUNTER — Telehealth: Payer: Self-pay | Admitting: Family

## 2017-05-25 LAB — IGG CSF INDEX
ALBUMIN CSF-MCNC: 50 mg/dL — AB (ref 11–48)
CSF IgG Index: UNDETERMINED
IGG CSF: 27.7 mg/dL — AB (ref 0.0–8.6)
IGG/ALB RATIO, CSF: 0.55 — AB (ref 0.00–0.25)

## 2017-05-25 LAB — VDRL, CSF: VDRL Quant, CSF: NONREACTIVE

## 2017-05-25 NOTE — Telephone Encounter (Signed)
noted 

## 2017-05-25 NOTE — Progress Notes (Signed)
Subjective:    Patient ID: Rodney Day, male    DOB: 01-17-1946, 71 y.o.   MRN: [id number]  CC: Rodney Day is a 71 y.o. male who presents today for follow up.   HPI: Patient admitted to [facility] for acute encephalopathy 6/5 and discharged 4 days later 6/9. DC and call made 6/11.  Admission was also notable for acute cystitis with hematuria, acute kidney injury.  He was consulted by neurology and lumbar puncture performed. He will follow-up with [facility] neurology. He was started on low-dose Ritalin try to improve waking status. It is not believed to be secondary to infection and suspected to be progressive dementia and/or delirium. Home health MRI of the brain was negative. EEG performed.  Lumbar puncture results mailed to you and will need to be followed by [place] neurology  Started on ensure  Neurology 06/09/17, Dr Jaynee Eagles, and to Urology on 06/15/17  Wife and son here today. Concern with falls and 'stumbling more often.' Wife reports for past year that she has noticed stumbling. Also note he reaches for things that are not there. Becoming less verbal.  Ritalin is helping keep him more awake; he is sleeping at night. Ritalin started at 5mg  and increased to 10mg .    Had seen Dr Mortimer Fries 04/2017 left lung nodule. H/o smoking; wife canceled testing as 'so much going on.' HISTORY:  Past Medical History:  Diagnosis Date  . Malignant neoplasm of bladder [facility])    Past Surgical History:  Procedure Laterality Date  . ELECTROMAGNETIC NAVIGATION BROCHOSCOPY N/A 05/01/2017   Procedure: ELECTROMAGNETIC NAVIGATION BRONCHOSCOPY;  Surgeon: Flora Lipps, MD;  Location: [facility] ORS;  Service: Cardiopulmonary;  Laterality: N/A;  . ERCP N/A 04/09/2016   Procedure: ENDOSCOPIC RETROGRADE CHOLANGIOPANCREATOGRAPHY (ERCP);  Surgeon: Hulen Luster, MD;  Location: [facility] ENDOSCOPY;  Service: Gastroenterology;  Laterality: N/A;  . Pr Cystectomy,Ileal Conduit/Sigmoid Bladder  05/13/2013   CYSTECTOMY,  COMPLETE, WITH URETEROILEAL CONDUIT OR SIGMOID BLADDER, INCLUDING INTESTINE ANASTOMOSIS; Surgeon: Ronnald Nian, MD  . Pr Remove Pelvis Lymph Nodes  05/13/2013   Procedure: PELVIC LYMPHADENECTOMY W/EXT ILIAC (SEPART PROC); Surgeon: Ronnald Nian, MD  . TRANSURETHRAL RESECTION OF BLADDER TUMOR     Family History  Problem Relation Age of Onset  . Bladder Cancer Father   . Prostate cancer Father   . COPD Father   . Stroke Paternal Uncle   . Dementia Mother   . Diabetes Mother     Allergies: Patient has no known allergies. Current Outpatient Prescriptions on File Prior to Visit  Medication Sig Dispense Refill  . albuterol (PROVENTIL HFA;VENTOLIN HFA) 108 (90 Base) MCG/ACT inhaler Inhale 2 puffs into the lungs every 4 (four) hours as needed for wheezing or shortness of breath. 1 Inhaler 6  . feeding supplement, ENSURE ENLIVE, (ENSURE ENLIVE) LIQD Take 237 mLs by mouth 3 (three) times daily between meals. 90 Bottle 0  . Glycopyrrolate-Formoterol (BEVESPI AEROSPHERE) 9-4.8 MCG/ACT AERO Inhale 2 puffs into the lungs 2 (two) times daily. 10.7 g 5  . ibuprofen (ADVIL,MOTRIN) 200 MG tablet Take 400 mg by mouth every 8 (eight) hours as needed for mild pain.     . methylphenidate (RITALIN) 10 MG tablet Take 1 tablet (10 mg total) by mouth 2 (two) times daily with breakfast and lunch. 60 tablet 0  . Multiple Vitamin (MULTIVITAMIN WITH MINERALS) TABS tablet Take 1 tablet by mouth daily.     No current facility-administered medications on file prior to visit.     Social History  Substance Use Topics  . Smoking status: Current Every Day Smoker    Packs/day: 0.25    Years: 51.00    Types: Cigarettes  . Smokeless tobacco: Never Used     Comment: 2-3 cigarettes/day  . Alcohol use 0.0 oz/week     Comment: 1 beer every 2 weeks    Review of Systems  Constitutional: Negative for chills and fever.  Respiratory: Negative for cough.   Cardiovascular: Negative for chest pain and palpitations.    Gastrointestinal: Negative for nausea and vomiting.  Neurological: Positive for weakness. Negative for syncope.  Psychiatric/Behavioral: Positive for confusion.      Objective:    BP 106/60   Pulse 87   Temp 97.6 F (36.4 C) (Oral)   Ht 6' (1.829 m)   SpO2 99%  BP Readings from Last 3 Encounters:  05/26/17 106/60  05/23/17 119/68  05/19/17 98/62   Wt Readings from Last 3 Encounters:  05/19/17 175 lb 1.6 oz (79.4 kg)  05/19/17 177 lb (80.3 kg)  05/05/17 185 lb (83.9 kg)    Physical Exam  Constitutional: He appears well-developed and well-nourished.  Cardiovascular: Regular rhythm and normal heart sounds.   Pulmonary/Chest: Effort normal and breath sounds normal. No respiratory distress. He has no wheezes. He has no rhonchi. He has no rales.  Neurological: He is alert.  Skin: Skin is warm and dry.  Psychiatric: He has a normal mood and affect. His speech is normal and behavior is normal.  Poor eye contact.  Doesn't answer questions  Vitals reviewed.      Assessment & Plan:   Problem List Items Addressed This Visit      Nervous and Auditory   Encephalopathy - Primary    Reviewed hospitalization and  answered questions from caregivers. Doing well on ritalin. Patient is seeing neurology in 2 weeks. Reviewed hospital course- normal ammonia, negative for syphilis. At this time, it appears etiology encephalopathy may be more related to Alzheimer's dementia however advised family that they can further discuss possibility of this diagnosis with Dr. Jaynee Eagles. Sent note so she can review LP results. Advised family to follow up with these results with her as well. Emphasized home health  And making a plan as disease progresses- assisted living facility list given to family. Will follow.                  Relevant Orders   Basic metabolic panel     Genitourinary   Bladder cancer [facility])    Pending appt with Dr Vikki Ports      AKI (acute kidney injury) [facility])    Pending bmp         Other   Lung mass    Reviewed kasa's note from 04/2017; advised spouse to follow up with him regarding lung nodule and pulmonary testing.           I am having Mr. Mossa maintain his ibuprofen, multivitamin with minerals, albuterol, Glycopyrrolate-Formoterol, methylphenidate, and feeding supplement (ENSURE ENLIVE).   No orders of the defined types were placed in this encounter.   Return precautions given.   Risks, benefits, and alternatives of the medications and treatment plan prescribed today were discussed, and patient expressed understanding.   Education regarding symptom management and diagnosis given to patient on AVS.  Continue to follow with Burnard Hawthorne, FNP for routine health maintenance.   Bella Kennedy and I agreed with plan.   Mable Paris, FNP

## 2017-05-25 NOTE — Telephone Encounter (Signed)
Transition Care Management Follow-up Telephone Call  How have you been since you were released from the hospital? Patient wife DPR stated that he is about the same, Patient wife said he is eating better and sleeping better. But still really unaware at times and weak.   Do you understand why you were in the hospital? Yes, sometimes he can tell you but others he cannot answer.   Do you understand the discharge instrcutions? Yes  Items Reviewed:  Medications reviewed: YEs  Allergies reviewed: Yes  Dietary changes reviewed: Yes  Referrals reviewed: Yes, to neurology on 06/09/17 and to Urology on 06/15/17   Functional Questionnaire:  Activities of Daily Living (ADLs):   He states they are independent in the following: Patient cannot perform ADLS due to weakness. States they require assistance with the following: Patient is unable to stand alone, cannot bathe or dress himself, patient DPR ( wife) stated Home health is suppose to call today to schedule PT and Skill nursing but patient has not heard from home health at this time.  Any transportation issues/concerns?: NO  Any patient concerns? Yes, concerning new test results.patient DPR saw something on My CHart that leads her to think patient may have MS?   Confirmed importance and date/time of follow-up visits scheduledyes   Confirmed with patient if condition begins to worsen call PCP or go to the ER.  Patient was given the Call-a-Nurse line [phone]

## 2017-05-26 ENCOUNTER — Other Ambulatory Visit: Payer: Self-pay | Admitting: Family

## 2017-05-26 ENCOUNTER — Encounter: Payer: Self-pay | Admitting: Family

## 2017-05-26 ENCOUNTER — Ambulatory Visit (INDEPENDENT_AMBULATORY_CARE_PROVIDER_SITE_OTHER): Payer: Medicare Other | Admitting: Family

## 2017-05-26 VITALS — BP 106/60 | HR 87 | Temp 97.6°F | Ht 72.0 in

## 2017-05-26 DIAGNOSIS — N390 Urinary tract infection, site not specified: Secondary | ICD-10-CM | POA: Diagnosis not present

## 2017-05-26 DIAGNOSIS — C679 Malignant neoplasm of bladder, unspecified: Secondary | ICD-10-CM

## 2017-05-26 DIAGNOSIS — R918 Other nonspecific abnormal finding of lung field: Secondary | ICD-10-CM

## 2017-05-26 DIAGNOSIS — Z936 Other artificial openings of urinary tract status: Secondary | ICD-10-CM | POA: Diagnosis not present

## 2017-05-26 DIAGNOSIS — G9340 Encephalopathy, unspecified: Secondary | ICD-10-CM | POA: Diagnosis not present

## 2017-05-26 DIAGNOSIS — N189 Chronic kidney disease, unspecified: Secondary | ICD-10-CM | POA: Diagnosis not present

## 2017-05-26 DIAGNOSIS — Z9181 History of falling: Secondary | ICD-10-CM | POA: Diagnosis not present

## 2017-05-26 DIAGNOSIS — E871 Hypo-osmolality and hyponatremia: Secondary | ICD-10-CM

## 2017-05-26 DIAGNOSIS — F17210 Nicotine dependence, cigarettes, uncomplicated: Secondary | ICD-10-CM | POA: Diagnosis not present

## 2017-05-26 DIAGNOSIS — N179 Acute kidney failure, unspecified: Secondary | ICD-10-CM

## 2017-05-26 LAB — BASIC METABOLIC PANEL
BUN: 15 mg/dL (ref 6–23)
CO2: 30 mEq/L (ref 19–32)
CREATININE: 1.15 mg/dL (ref 0.40–1.50)
Calcium: 10 mg/dL (ref 8.4–10.5)
Chloride: 95 mEq/L — ABNORMAL LOW (ref 96–112)
GFR: 66.82 mL/min (ref >60.00)
Glucose, Bld: 117 mg/dL — ABNORMAL HIGH (ref 70–99)
Potassium: 4.2 mEq/L (ref 3.5–5.1)
Sodium: 130 mEq/L — ABNORMAL LOW (ref 135–145)

## 2017-05-26 LAB — MISC LABCORP TEST (SEND OUT): Labcorp test code: 6981

## 2017-05-26 NOTE — Assessment & Plan Note (Signed)
Reviewed kasa's note from 04/2017; advised spouse to follow up with him regarding lung nodule and pulmonary testing.

## 2017-05-26 NOTE — Assessment & Plan Note (Signed)
Pending appt with Dr Vikki Ports

## 2017-05-26 NOTE — Patient Instructions (Signed)
Labs today  Home health- discuss with them social work.  Start to look at assisted living facilities - they will review financials with you.   Ensure you do follow up with Kasa at some point

## 2017-05-26 NOTE — Assessment & Plan Note (Signed)
Pending bmp

## 2017-05-26 NOTE — Progress Notes (Signed)
BMBPre visit review using our clinic review tool, if applicable. No additional management support is needed unless otherwise documented below in the visit note.

## 2017-05-26 NOTE — Assessment & Plan Note (Addendum)
Reviewed hospitalization and  answered questions from caregivers. Doing well on ritalin. Patient is seeing neurology in 2 weeks. Reviewed hospital course- normal ammonia, negative for syphilis. At this time, it appears etiology encephalopathy may be more related to Alzheimer's dementia however advised family that they can further discuss possibility of this diagnosis with Dr. Jaynee Eagles. Sent note so she can review LP results. Advised family to follow up with these results with her as well. Emphasized home health  And making a plan as disease progresses- assisted living facility list given to family. Will follow.

## 2017-05-27 DIAGNOSIS — G9340 Encephalopathy, unspecified: Secondary | ICD-10-CM | POA: Diagnosis not present

## 2017-05-27 DIAGNOSIS — C679 Malignant neoplasm of bladder, unspecified: Secondary | ICD-10-CM | POA: Diagnosis not present

## 2017-05-27 DIAGNOSIS — Z936 Other artificial openings of urinary tract status: Secondary | ICD-10-CM | POA: Diagnosis not present

## 2017-05-27 DIAGNOSIS — N189 Chronic kidney disease, unspecified: Secondary | ICD-10-CM | POA: Diagnosis not present

## 2017-05-27 DIAGNOSIS — N390 Urinary tract infection, site not specified: Secondary | ICD-10-CM | POA: Diagnosis not present

## 2017-05-27 DIAGNOSIS — Z9181 History of falling: Secondary | ICD-10-CM | POA: Diagnosis not present

## 2017-05-27 LAB — CSF CULTURE: CULTURE: NO GROWTH

## 2017-05-27 LAB — CSF CULTURE W GRAM STAIN: Gram Stain: NONE SEEN

## 2017-05-28 DIAGNOSIS — Z9181 History of falling: Secondary | ICD-10-CM | POA: Diagnosis not present

## 2017-05-28 DIAGNOSIS — Z936 Other artificial openings of urinary tract status: Secondary | ICD-10-CM | POA: Diagnosis not present

## 2017-05-28 DIAGNOSIS — N390 Urinary tract infection, site not specified: Secondary | ICD-10-CM | POA: Diagnosis not present

## 2017-05-28 DIAGNOSIS — G9340 Encephalopathy, unspecified: Secondary | ICD-10-CM | POA: Diagnosis not present

## 2017-05-28 DIAGNOSIS — C679 Malignant neoplasm of bladder, unspecified: Secondary | ICD-10-CM | POA: Diagnosis not present

## 2017-05-28 DIAGNOSIS — N189 Chronic kidney disease, unspecified: Secondary | ICD-10-CM | POA: Diagnosis not present

## 2017-05-29 ENCOUNTER — Other Ambulatory Visit: Payer: Medicare Other

## 2017-05-29 ENCOUNTER — Other Ambulatory Visit (INDEPENDENT_AMBULATORY_CARE_PROVIDER_SITE_OTHER): Payer: Medicare Other

## 2017-05-29 DIAGNOSIS — N189 Chronic kidney disease, unspecified: Secondary | ICD-10-CM | POA: Diagnosis not present

## 2017-05-29 DIAGNOSIS — E871 Hypo-osmolality and hyponatremia: Secondary | ICD-10-CM | POA: Diagnosis not present

## 2017-05-29 DIAGNOSIS — N390 Urinary tract infection, site not specified: Secondary | ICD-10-CM | POA: Diagnosis not present

## 2017-05-29 DIAGNOSIS — G9340 Encephalopathy, unspecified: Secondary | ICD-10-CM | POA: Diagnosis not present

## 2017-05-29 DIAGNOSIS — Z936 Other artificial openings of urinary tract status: Secondary | ICD-10-CM | POA: Diagnosis not present

## 2017-05-29 DIAGNOSIS — C679 Malignant neoplasm of bladder, unspecified: Secondary | ICD-10-CM | POA: Diagnosis not present

## 2017-05-29 DIAGNOSIS — Z9181 History of falling: Secondary | ICD-10-CM | POA: Diagnosis not present

## 2017-05-29 LAB — BASIC METABOLIC PANEL WITH GFR
BUN: 22 mg/dL (ref 6–23)
CO2: 26 meq/L (ref 19–32)
Calcium: 9.5 mg/dL (ref 8.4–10.5)
Chloride: 94 meq/L — ABNORMAL LOW (ref 96–112)
Creatinine, Ser: 1.37 mg/dL (ref 0.40–1.50)
GFR: 54.59 mL/min — ABNORMAL LOW (ref >60.00)
Glucose, Bld: 99 mg/dL (ref 70–99)
Potassium: 4 meq/L (ref 3.5–5.1)
Sodium: 131 meq/L — ABNORMAL LOW (ref 135–145)

## 2017-05-29 LAB — OLIGOCLONAL BANDS, CSF + SERM

## 2017-06-01 ENCOUNTER — Other Ambulatory Visit: Payer: Self-pay | Admitting: Family

## 2017-06-01 DIAGNOSIS — E871 Hypo-osmolality and hyponatremia: Secondary | ICD-10-CM

## 2017-06-01 DIAGNOSIS — Z936 Other artificial openings of urinary tract status: Secondary | ICD-10-CM | POA: Diagnosis not present

## 2017-06-01 DIAGNOSIS — C679 Malignant neoplasm of bladder, unspecified: Secondary | ICD-10-CM | POA: Diagnosis not present

## 2017-06-01 DIAGNOSIS — G9340 Encephalopathy, unspecified: Secondary | ICD-10-CM | POA: Diagnosis not present

## 2017-06-01 DIAGNOSIS — N189 Chronic kidney disease, unspecified: Secondary | ICD-10-CM | POA: Diagnosis not present

## 2017-06-01 DIAGNOSIS — N390 Urinary tract infection, site not specified: Secondary | ICD-10-CM | POA: Diagnosis not present

## 2017-06-01 DIAGNOSIS — Z9181 History of falling: Secondary | ICD-10-CM | POA: Diagnosis not present

## 2017-06-02 DIAGNOSIS — N189 Chronic kidney disease, unspecified: Secondary | ICD-10-CM | POA: Diagnosis not present

## 2017-06-02 DIAGNOSIS — Z936 Other artificial openings of urinary tract status: Secondary | ICD-10-CM | POA: Diagnosis not present

## 2017-06-02 DIAGNOSIS — C679 Malignant neoplasm of bladder, unspecified: Secondary | ICD-10-CM | POA: Diagnosis not present

## 2017-06-02 DIAGNOSIS — G9340 Encephalopathy, unspecified: Secondary | ICD-10-CM | POA: Diagnosis not present

## 2017-06-02 DIAGNOSIS — N390 Urinary tract infection, site not specified: Secondary | ICD-10-CM | POA: Diagnosis not present

## 2017-06-02 DIAGNOSIS — Z9181 History of falling: Secondary | ICD-10-CM | POA: Diagnosis not present

## 2017-06-03 ENCOUNTER — Telehealth: Payer: Self-pay | Admitting: *Deleted

## 2017-06-03 ENCOUNTER — Other Ambulatory Visit (INDEPENDENT_AMBULATORY_CARE_PROVIDER_SITE_OTHER): Payer: Medicare Other

## 2017-06-03 DIAGNOSIS — N189 Chronic kidney disease, unspecified: Secondary | ICD-10-CM | POA: Diagnosis not present

## 2017-06-03 DIAGNOSIS — N390 Urinary tract infection, site not specified: Secondary | ICD-10-CM | POA: Diagnosis not present

## 2017-06-03 DIAGNOSIS — G9340 Encephalopathy, unspecified: Secondary | ICD-10-CM | POA: Diagnosis not present

## 2017-06-03 DIAGNOSIS — Z9181 History of falling: Secondary | ICD-10-CM | POA: Diagnosis not present

## 2017-06-03 DIAGNOSIS — E871 Hypo-osmolality and hyponatremia: Secondary | ICD-10-CM | POA: Diagnosis not present

## 2017-06-03 DIAGNOSIS — C679 Malignant neoplasm of bladder, unspecified: Secondary | ICD-10-CM | POA: Diagnosis not present

## 2017-06-03 DIAGNOSIS — Z936 Other artificial openings of urinary tract status: Secondary | ICD-10-CM | POA: Diagnosis not present

## 2017-06-03 NOTE — Telephone Encounter (Signed)
Called pt to set up follow up CT scan and follow up appt with Dr. Grayland Ormond in the next 1-2 weeks. spoke with pt's wife and she stated that she will call back to schedule appt due to the pt seeing multiple doctors at this time. Contact info given to pt's wife and encouraged to call back when ready to schedule follow up appts with Dr. Grayland Ormond to follow up on lung abnormality. Pt's wife verbalized understanding.

## 2017-06-04 ENCOUNTER — Encounter: Payer: Self-pay | Admitting: Neurology

## 2017-06-04 ENCOUNTER — Telehealth: Payer: Self-pay | Admitting: *Deleted

## 2017-06-04 ENCOUNTER — Emergency Department (HOSPITAL_COMMUNITY): Payer: Medicare Other

## 2017-06-04 ENCOUNTER — Emergency Department (HOSPITAL_COMMUNITY)
Admission: EM | Admit: 2017-06-04 | Discharge: 2017-06-05 | Disposition: A | Discharge status: Home / Self Care | Payer: Medicare Other | Attending: Emergency Medicine | Admitting: Emergency Medicine

## 2017-06-04 ENCOUNTER — Encounter (HOSPITAL_COMMUNITY): Payer: Self-pay

## 2017-06-04 ENCOUNTER — Ambulatory Visit (INDEPENDENT_AMBULATORY_CARE_PROVIDER_SITE_OTHER): Payer: Medicare Other | Admitting: Neurology

## 2017-06-04 VITALS — BP 107/70 | HR 68 | Ht 72.0 in | Wt 170.0 lb

## 2017-06-04 DIAGNOSIS — R410 Disorientation, unspecified: Secondary | ICD-10-CM | POA: Diagnosis not present

## 2017-06-04 DIAGNOSIS — Z79899 Other long term (current) drug therapy: Secondary | ICD-10-CM | POA: Diagnosis not present

## 2017-06-04 DIAGNOSIS — R569 Unspecified convulsions: Secondary | ICD-10-CM

## 2017-06-04 DIAGNOSIS — Z87891 Personal history of nicotine dependence: Secondary | ICD-10-CM | POA: Diagnosis not present

## 2017-06-04 DIAGNOSIS — R5382 Chronic fatigue, unspecified: Secondary | ICD-10-CM | POA: Diagnosis not present

## 2017-06-04 DIAGNOSIS — G9340 Encephalopathy, unspecified: Secondary | ICD-10-CM | POA: Diagnosis not present

## 2017-06-04 DIAGNOSIS — Z8551 Personal history of malignant neoplasm of bladder: Secondary | ICD-10-CM | POA: Insufficient documentation

## 2017-06-04 LAB — CBC WITH DIFFERENTIAL/PLATELET
BASOS ABS: 0 10*3/uL (ref 0.0–0.1)
Basophils Relative: 0 %
EOS ABS: 0.1 10*3/uL (ref 0.0–0.7)
EOS PCT: 2 %
HCT: 34.8 % — ABNORMAL LOW (ref 39.0–52.0)
Hemoglobin: 11.8 g/dL — ABNORMAL LOW (ref 13.0–17.0)
LYMPHS PCT: 28 %
Lymphs Abs: 1.5 10*3/uL (ref 0.7–4.0)
MCH: 30.2 pg (ref 26.0–34.0)
MCHC: 33.9 g/dL (ref 30.0–36.0)
MCV: 89 fL (ref 78.0–100.0)
Monocytes Absolute: 0.7 10*3/uL (ref 0.1–1.0)
Monocytes Relative: 13 %
Neutro Abs: 2.9 10*3/uL (ref 1.7–7.7)
Neutrophils Relative %: 57 %
PLATELETS: 247 10*3/uL (ref 150–400)
RBC: 3.91 MIL/uL — AB (ref 4.22–5.81)
RDW: 13.7 % (ref 11.5–15.5)
WBC: 5.2 10*3/uL (ref 4.0–10.5)

## 2017-06-04 LAB — COMPREHENSIVE METABOLIC PANEL
ALBUMIN: 3.5 g/dL (ref 3.5–5.0)
ALT: 16 U/L — AB (ref 17–63)
AST: 15 U/L (ref 15–41)
Alkaline Phosphatase: 67 U/L (ref 38–126)
Anion gap: 7 (ref 5–15)
BUN: 20 mg/dL (ref 6–20)
CO2: 27 mmol/L (ref 22–32)
CREATININE: 1.34 mg/dL — AB (ref 0.61–1.24)
Calcium: 8.9 mg/dL (ref 8.9–10.3)
Chloride: 94 mmol/L — ABNORMAL LOW (ref 101–111)
GFR calc Af Amer: >60 mL/min (ref >60)
GFR calc non Af Amer: 52 mL/min — ABNORMAL LOW (ref >60)
Glucose, Bld: 107 mg/dL — ABNORMAL HIGH (ref 65–99)
Potassium: 3.9 mmol/L (ref 3.5–5.1)
SODIUM: 128 mmol/L — AB (ref 135–145)
Total Bilirubin: 0.5 mg/dL (ref 0.3–1.2)
Total Protein: 6.1 g/dL — ABNORMAL LOW (ref 6.5–8.1)

## 2017-06-04 LAB — CBG MONITORING, ED: Glucose-Capillary: 103 mg/dL — ABNORMAL HIGH (ref 65–99)

## 2017-06-04 LAB — CORTISOL-AM, BLOOD: Cortisol - AM: 18 ug/dL

## 2017-06-04 LAB — I-STAT TROPONIN, ED: Troponin i, poc: 0 ng/mL (ref 0.00–0.08)

## 2017-06-04 NOTE — Telephone Encounter (Signed)
Called [facility]. Spoke with Judson Roch. Scheduled routine lab pick up for lock box. Confirmation number: [id number].

## 2017-06-04 NOTE — Discharge Instructions (Signed)
As discussed, follow up with Dr. Jannifer [name] tomorrow. [facility]osp[facility] will be contacting you tomorrow to discuss services available.   Return if he experiences new concerning symptoms in the meantime.

## 2017-06-04 NOTE — ED Notes (Signed)
[facility] called for Pt 

## 2017-06-04 NOTE — ED Provider Notes (Signed)
[facility] DEPT Provider Note   CSN: [id number] Arrival date & time: 06/04/17  2018     History   Chief Complaint Chief Complaint  Patient presents with  . Seizures    HPI Rodney Day is a 71 y.o. male presenting via EMS after an episode of seizure-like activity at home. Family members explained that he was using his walker and over the commode when he suddenly became really stiff grasping his walker and began to have jerking movements in his entire body and throwing his head back. His wife explains that he has been having altered mental status since March and was evaluated today by a neurologist who ordered multiple blood tests. She explains that he starts talking nonsense and does not obey commands very well and this is his baseline. She reports a history of testicular cancer and urostomy bag. No history of seizure or similar episodes. Denies any recent illness, fever, headache, nausea, vomiting, diarrhea or other symptoms.  HPI  Past Medical History:  Diagnosis Date  . Malignant neoplasm of bladder [facility])     Patient Active Problem List   Diagnosis Date Noted  . AKI (acute kidney injury) ([place])   . Encephalopathy 05/19/2017  . Acute on chronic kidney failure ([place]) 05/19/2017  . Complicated UTI (urinary tract infection) 05/05/2017  . Adenopathy   . Lung mass 04/16/2017  . Cholelithiasis with cholecystitis or obstruction 03/29/2016  . Bladder cancer ([place]) 05/25/2015    Past Surgical History:  Procedure Laterality Date  . ELECTROMAGNETIC NAVIGATION BROCHOSCOPY N/A 05/01/2017   Procedure: ELECTROMAGNETIC NAVIGATION BRONCHOSCOPY;  Surgeon: Flora Lipps, MD;  Location: [facility] ORS;  Service: Cardiopulmonary;  Laterality: N/A;  . ERCP N/A 04/09/2016   Procedure: ENDOSCOPIC RETROGRADE CHOLANGIOPANCREATOGRAPHY (ERCP);  Surgeon: Hulen Luster, MD;  Location: [facility] ENDOSCOPY;  Service: Gastroenterology;  Laterality: N/A;  . Pr Cystectomy,Ileal Conduit/Sigmoid Bladder  05/13/2013   CYSTECTOMY, COMPLETE, WITH URETEROILEAL CONDUIT OR SIGMOID BLADDER, INCLUDING INTESTINE ANASTOMOSIS; Surgeon: Ronnald Nian, MD  . Pr Remove Pelvis Lymph Nodes  05/13/2013   Procedure: PELVIC LYMPHADENECTOMY W/EXT ILIAC (SEPART PROC); Surgeon: Ronnald Nian, MD  . TRANSURETHRAL RESECTION OF BLADDER TUMOR         Home Medications    Prior to Admission medications   Medication Sig Start Date End Date Taking? Authorizing Provider  feeding supplement, ENSURE ENLIVE, (ENSURE ENLIVE) LIQD Take 237 mLs by mouth 3 (three) times daily between meals. Patient taking differently: Take 237 mLs by mouth 2 (two) times daily. Drinks boost 05/23/17  Yes Wieting, Richard, MD  Glycopyrrolate-Formoterol (BEVESPI AEROSPHERE) 9-4.8 MCG/ACT AERO Inhale 2 puffs into the lungs 2 (two) times daily. 04/28/17  Yes Flora Lipps, MD  methylphenidate (RITALIN) 10 MG tablet Take 1 tablet (10 mg total) by mouth 2 (two) times daily with breakfast and lunch. 05/23/17 05/23/18 Yes Wieting, Richard, MD  Multiple Vitamin (MULTIVITAMIN WITH MINERALS) TABS tablet Take 1 tablet by mouth daily.   Yes [provider]  albuterol (PROVENTIL HFA;VENTOLIN HFA) 108 (90 Base) MCG/ACT inhaler Inhale 2 puffs into the lungs every 4 (four) hours as needed for wheezing or shortness of breath. 04/28/17   Flora Lipps, MD  ibuprofen (ADVIL,MOTRIN) 200 MG tablet Take 400 mg by mouth every 8 (eight) hours as needed for mild pain.     [provider]    Family History Family History  Problem Relation Age of Onset  . Bladder Cancer Father   . Prostate cancer Father   . COPD Father   .  Stroke Paternal Uncle   . Dementia Mother   . Diabetes Mother     Social History Social History  Substance Use Topics  . Smoking status: Former Smoker    Packs/day: 0.25    Years: 51.00    Types: Cigarettes  . Smokeless tobacco: Never Used     Comment: 06/04/17 - not smoked in two months  . Alcohol use 0.0 oz/week     Comment: 1 beer every  2 weeks     Allergies   Patient has no known allergies.   Review of Systems Review of Systems  Constitutional: Negative for chills and fever.  HENT: Negative for ear pain, sore throat and trouble swallowing.   Eyes: Negative for pain and visual disturbance.  Respiratory: Negative for cough, chest tightness, shortness of breath, wheezing and stridor.   Cardiovascular: Negative for chest pain, palpitations and leg swelling.  Gastrointestinal: Negative for abdominal distention, abdominal pain, diarrhea, nausea and vomiting.  Genitourinary: Negative for dysuria, flank pain and hematuria.  Musculoskeletal: Negative for arthralgias, back pain, myalgias, neck pain and neck stiffness.  Skin: Negative for color change, pallor and rash.  Neurological: Positive for seizures. Negative for dizziness, syncope, facial asymmetry, light-headedness and headaches.     Physical Exam Updated Vital Signs BP 113/82   Pulse 69   Temp 98.7 F (37.1 C) (Oral)   Resp 17   Ht 6' (1.829 m)   Wt 79.4 kg (175 lb)   SpO2 95%   BMI 23.73 kg/m   Physical Exam  Constitutional: He appears well-developed and well-nourished. No distress.  Afebrile, nontoxic-appearing, lying comfortably in bed in no acute distress.  HENT:  Head: Normocephalic and atraumatic.  Eyes: Conjunctivae are normal.  Right pupil is 4 mm and left pupil is 3 mm. Both are reactive and round  Neck: Normal range of motion. Neck supple.  Cardiovascular: Normal rate, regular rhythm, normal heart sounds and intact distal pulses.   No murmur heard. Pulmonary/Chest: Effort normal and breath sounds normal. No stridor. No respiratory distress. He has no wheezes. He has no rales.  Abdominal: Soft. He exhibits no distension and no mass. There is no tenderness. There is no guarding.  Musculoskeletal: Normal range of motion. He exhibits no edema, tenderness or deformity.  Neurological: He is alert.  Patient is alert and oriented to person place.  Cannot recall events or how he was brought here today.  Patient doesn't respond consistently to commands and exhibits poor effort when attempting to evaluate lower extremity strength. He is moving all extremities and has strong grips bilaterally.    Skin: Skin is warm and dry. No rash noted. He is not diaphoretic. No erythema. No pallor.  Psychiatric: He has a normal mood and affect.  Nursing note and vitals reviewed.    ED Treatments / Results  Labs (all labs ordered are listed, but only abnormal results are displayed) Labs Reviewed  CBC WITH DIFFERENTIAL/PLATELET - Abnormal; Notable for the following:       Result Value   RBC 3.91 (*)    Hemoglobin 11.8 (*)    HCT 34.8 (*)    All other components within normal limits  COMPREHENSIVE METABOLIC PANEL - Abnormal; Notable for the following:    Sodium 128 (*)    Chloride 94 (*)    Glucose, Bld 107 (*)    Creatinine, Ser 1.34 (*)    Total Protein 6.1 (*)    ALT 16 (*)    GFR calc non Af Amer 52 (*)  All other components within normal limits  CBG MONITORING, ED - Abnormal; Notable for the following:    Glucose-Capillary 103 (*)    All other components within normal limits  URINALYSIS, ROUTINE W REFLEX MICROSCOPIC  I-STAT TROPOININ, ED    EKG  EKG Interpretation None       Radiology Dg Chest 2 View  Result Date: 06/04/2017 CLINICAL DATA:  Chronic confusion and acute onset of seizure like activity. Initial encounter. EXAM: CHEST  2 VIEW COMPARISON:  None. FINDINGS: The lungs are well-aerated and clear. There is no evidence of focal opacification, pleural effusion or pneumothorax. The heart is normal in size; the mediastinal contour is within normal limits. No acute osseous abnormalities are seen. IMPRESSION: No acute cardiopulmonary process seen. Electronically Signed   By: Garald Balding M.D.   On: 06/04/2017 21:37   Ct Head Wo Contrast  Result Date: 06/04/2017 CLINICAL DATA:  Altered level of consciousness. Confusion for  months. EXAM: CT HEAD WITHOUT CONTRAST TECHNIQUE: Contiguous axial images were obtained from the base of the skull through the vertex without intravenous contrast. COMPARISON:  None. FINDINGS: Brain: Mild generalized atrophy, normal for age. No evidence of acute infarction, hemorrhage, hydrocephalus, extra-axial collection or mass lesion/mass effect. Vascular: Atherosclerosis of skullbase vasculature without hyperdense vessel or abnormal calcification. Skull: Normal. Negative for fracture or focal lesion. Sinuses/Orbits: Paranasal sinuses and mastoid air cells are clear. The visualized orbits are unremarkable. Other: None. IMPRESSION: No acute intracranial abnormality or explanation for altered level of consciousness. Electronically Signed   By: Jeb Levering M.D.   On: 06/04/2017 22:03    Procedures Procedures (including critical care time)  Medications Ordered in ED Medications - No data to display   Initial Impression / Assessment and Plan / ED Course  I have reviewed the triage vital signs and the nursing notes.  Pertinent labs & imaging results that were available during my care of the patient were reviewed by me and considered in my medical decision making (see chart for details).    Patient presenting with history of altered mental status since March and new episodes of  seizure-like activity at home today. He has been evaluated by neurology with last visit earlier today.  Wife reports that everything else right now is at baseline for him. He is a poor historian and difficult to get him to follow commands which she also states is now baseline. She reports that he recently regained his appetite. Her denies any pain. He is afebrile and nontoxic appearing. No meningeal signs.   Gross neuro exam is normal. Unequal pupils both are reactive and round.  Ordered labs and CT head.  Will reassess. On reassessment, patient was sleeping comfortably and wife reports this has been his new  baseline. CT without acute findings to explain seizure. Labs otherwise unremarkable. [facility] Dr. Jannifer [name] is following him and ordered labs today to evaluate for inflammatory systemic ilnesses.  Dr. Venora Maples and I had a long discussion with patient's spouse regarding palliative care/hospice help. His condition has seen a rapid decline since March and etiology of his dementing illness remains unclear. Dr. Venora Maples will send note to his neurologist.   Will contact palliative care/hospice and have them visit them at home tomorrow.  Wife is agreeable to this plan. Will call transport per family request. Discussed strict return precautions and advised to return to the emergency department if experiencing any new or worsening symptoms. Instructions were understood and patient agreed with discharge plan. Final Clinical Impressions(s) / ED  Diagnoses   Final diagnoses:  Seizure-like activity [facility])    New Prescriptions New Prescriptions   No medications on file     Dossie Der 06/05/17 0006    Jola Schmidt, MD 06/05/17 [phone]

## 2017-06-04 NOTE — Progress Notes (Signed)
Reason for visit: Progressive dementia  Referring physician: [facility]  Rodney Day is a 71 y.o. male  History of present illness:  Rodney Day is a 71 year old right-handed white male with a history of a rapidly progressive dementing illness that began in March 2018. Prior to this, his cognitive status was normal. The patient began having some increasing problems with confusion that came on over several weeks. He had a decrease in appetite and increasing drowsiness. He has lost 30 pounds of weight since March 2018. He has been sleeping during the day and night. He will jerk and twitch during his sleep. He reports dizziness with standing, he has had a staggering gait, he has been found to have orthostatic hypotension. The patient has occasional complaints some tingling in the feet, no focal weakness was noted. The patient does not have slurred speech or difficulty swallowing. He does not report any headaches or visual changes. He has had some problems with hearing. The patient does have hallucinations. The patient has been placed on Ritalin to help keep him awake, but there are concerns about a reduction in appetite on this medication. The patient was admitted to the hospital on 2 occasions, the first on 05/05/2017 with some mild dehydration. The patient went back in the hospital with increased confusion on 05/19/2017. Blood work has shown a normal ammonia level, normal B12 level, RPR is negative. The patient has undergone a lumbar puncture that shows elevated spinal fluid protein, glucose was normal. 0 white blood cells were noted. The patient had 12 oligoclonal bands that were not present in the serum. The patient has gotten to the point where he is unable to operate a motor vehicle, he requires assistance with bathing and dressing, he does not consistently recognize his family members.  The patient has a history of bladder cancer, he has a urostomy bag. He is currently walking with a  walker, he has required a walker over the last one month. He is sent to this office for further evaluation.  Past Medical History:  Diagnosis Date  . Malignant neoplasm of bladder [facility])     Past Surgical History:  Procedure Laterality Date  . ELECTROMAGNETIC NAVIGATION BROCHOSCOPY N/A 05/01/2017   Procedure: ELECTROMAGNETIC NAVIGATION BRONCHOSCOPY;  Surgeon: Flora Lipps, MD;  Location: [facility] ORS;  Service: Cardiopulmonary;  Laterality: N/A;  . ERCP N/A 04/09/2016   Procedure: ENDOSCOPIC RETROGRADE CHOLANGIOPANCREATOGRAPHY (ERCP);  Surgeon: Hulen Luster, MD;  Location: [facility] ENDOSCOPY;  Service: Gastroenterology;  Laterality: N/A;  . Pr Cystectomy,Ileal Conduit/Sigmoid Bladder  05/13/2013   CYSTECTOMY, COMPLETE, WITH URETEROILEAL CONDUIT OR SIGMOID BLADDER, INCLUDING INTESTINE ANASTOMOSIS; Surgeon: Ronnald Nian, MD  . Pr Remove Pelvis Lymph Nodes  05/13/2013   Procedure: PELVIC LYMPHADENECTOMY W/EXT ILIAC (SEPART PROC); Surgeon: Ronnald Nian, MD  . TRANSURETHRAL RESECTION OF BLADDER TUMOR      Family History  Problem Relation Age of Onset  . Bladder Cancer Father   . Prostate cancer Father   . COPD Father   . Stroke Paternal Uncle   . Dementia Mother   . Diabetes Mother     Social history:  reports that he has quit smoking. His smoking use included Cigarettes. He has a 12.75 pack-year smoking history. He has never used smokeless tobacco. He reports that he drinks alcohol. He reports that he does not use drugs.  Medications:  Prior to Admission medications   Medication Sig Start Date End Date Taking? Authorizing Provider  albuterol (PROVENTIL HFA;VENTOLIN HFA) 108 (90  Base) MCG/ACT inhaler Inhale 2 puffs into the lungs every 4 (four) hours as needed for wheezing or shortness of breath. 04/28/17  Yes Kasa, Maretta Bees, MD  feeding supplement, ENSURE ENLIVE, (ENSURE ENLIVE) LIQD Take 237 mLs by mouth 3 (three) times daily between meals. 05/23/17  Yes Wieting, Richard, MD    Glycopyrrolate-Formoterol (BEVESPI AEROSPHERE) 9-4.8 MCG/ACT AERO Inhale 2 puffs into the lungs 2 (two) times daily. 04/28/17  Yes Flora Lipps, MD  ibuprofen (ADVIL,MOTRIN) 200 MG tablet Take 400 mg by mouth every 8 (eight) hours as needed for mild pain.    Yes [provider]  methylphenidate (RITALIN) 10 MG tablet Take 1 tablet (10 mg total) by mouth 2 (two) times daily with breakfast and lunch. 05/23/17 05/23/18 Yes Wieting, Richard, MD  Multiple Vitamin (MULTIVITAMIN WITH MINERALS) TABS tablet Take 1 tablet by mouth daily.   Yes [provider]     No Known Allergies  ROS:  Out of a complete 14 system review of symptoms, the patient complains only of the following symptoms, and all other reviewed systems are negative.  Weight loss, fatigue Hearing loss Loss of vision Shortness of breath Constipation Memory loss, confusion, weakness, dizziness 2 much sleep, decreased energy, change in appetite, disinterest in activities, hallucinations Sleepiness, snoring, restlessness   Blood pressure 107/70, pulse 68, height 6' (1.829 m), weight 170 lb (77.1 kg).   Blood pressure, right arm, sitting is 98/62. Blood pressure, right arm, standing is 92/60.  Physical Exam  General: The patient is alert and cooperative at the time of the examination.  Eyes: Pupils are equal, round, and reactive to light. Discs are flat bilaterally.  Neck: The neck is supple, no carotid bruits are noted.  Respiratory: The respiratory examination is clear.  Cardiovascular: The cardiovascular examination reveals a regular rate and rhythm, no obvious murmurs or rubs are noted.  Skin: Extremities are without significant edema.  Neurologic Exam  Mental status: The patient is alert and oriented x 2 at the time of the examination (not oriented to date). The Mini-Mental Status Examination done today shows a total score of 19/30..  Cranial nerves: Facial symmetry is present. There is good sensation  of the face to pinprick and soft touch bilaterally. The strength of the facial muscles and the muscles to head turning and shoulder shrug are normal bilaterally. Speech is well enunciated, no aphasia or dysarthria is noted. Extraocular movements are full. Visual fields are full. The tongue is midline, and the patient has symmetric elevation of the soft palate. No obvious hearing deficits are noted.  Motor: The motor testing reveals 5 over 5 strength of all 4 extremities. Good symmetric motor tone is noted throughout.  Sensory: Sensory testing is intact to pinprick, soft touch, and vibration sensation, with exception of decreased pinprick sensation and vibration sensation on the left leg. Position sense is decreased on all 4 extremities. No evidence of extinction is noted.  Coordination: Cerebellar testing reveals good finger-nose-finger and heel-to-shin bilaterally. Some apraxia with the use of the extremities is noted. No asterixis is seen.  Gait and station: Gait is wide-based, unsteady. Tandem gait was not attempted. Romberg is positive. No drift is seen.  Reflexes: Deep tendon reflexes are symmetric and normal bilaterally. Toes are downgoing bilaterally.   MRI brain 05/08/17:  IMPRESSION: Normal MRI of the brain for age. No intracranial or calvarial metastatic disease.  * MRI scan images were reviewed online. I agree with the written report.   EEG 05/21/17:  IMPRESSION: This is an  abnormal electroencephalogram due to the presence of infrequent triphasic discharges.  This finding is consistent with an encephalopathy of nonspecific etiology although most commonly attributed to a hepatic encephalopathy.  Clinical correlation recommended.      Assessment/Plan:  1. Rapidly progressive dementia   2. Renal insufficiency   3. Hypotension   4. History of bladder cancer   The patient has developed a rapidly progressive dementing illness. An EEG study done in the hospital showed  triphasic waves. The spinal fluid shows an elevated protein and oligoclonal banding. The patient will be sent for further blood work evaluation. Entities such as CJD usually do not produce oligoclonal banding, but subacute sclerosing panencephalitis does. We will also need to look for a paraneoplastic syndrome, lupus, sarcoidosis, NMDA receptor antibody encephalitis, or an encephalopathy associated with thyroglobulin antibodies. Depending upon the results of the above testing, a trial on prednisone may be done. Will follow-up in 3-4 weeks.   Jill Alexanders MD 06/04/2017 7:37 AM  [facility] [address] [place], [place] [address]  Phone [phone] Fax [phone]

## 2017-06-04 NOTE — ED Triage Notes (Signed)
PT seen neurologist today pt has been confused since march and been having a decrease in mental status. Pt had increased protein in the CSF, but activity and mental status was normal at baseline. Around 1800 today pt was ambulating to the bathroom with family when he started to have seizure like activity with head rolled back and arm started to shake. Pt family called ems.

## 2017-06-05 ENCOUNTER — Telehealth: Payer: Self-pay | Admitting: Neurology

## 2017-06-05 DIAGNOSIS — N390 Urinary tract infection, site not specified: Secondary | ICD-10-CM | POA: Diagnosis not present

## 2017-06-05 DIAGNOSIS — N189 Chronic kidney disease, unspecified: Secondary | ICD-10-CM | POA: Diagnosis not present

## 2017-06-05 DIAGNOSIS — G9340 Encephalopathy, unspecified: Secondary | ICD-10-CM | POA: Diagnosis not present

## 2017-06-05 DIAGNOSIS — R4182 Altered mental status, unspecified: Secondary | ICD-10-CM | POA: Diagnosis not present

## 2017-06-05 DIAGNOSIS — R569 Unspecified convulsions: Secondary | ICD-10-CM | POA: Diagnosis not present

## 2017-06-05 DIAGNOSIS — C679 Malignant neoplasm of bladder, unspecified: Secondary | ICD-10-CM | POA: Diagnosis not present

## 2017-06-05 DIAGNOSIS — Z9181 History of falling: Secondary | ICD-10-CM | POA: Diagnosis not present

## 2017-06-05 DIAGNOSIS — Z936 Other artificial openings of urinary tract status: Secondary | ICD-10-CM | POA: Diagnosis not present

## 2017-06-05 LAB — PROTEIN ELECTROPHORESIS, SERUM
ALBUMIN ELP: 3.6 g/dL — AB (ref 3.8–4.8)
ALPHA-1-GLOBULIN: 0.5 g/dL — AB (ref 0.2–0.3)
Alpha-2-Globulin: 0.8 g/dL (ref 0.5–0.9)
Beta 2: 0.3 g/dL (ref 0.2–0.5)
Beta Globulin: 0.4 g/dL (ref 0.4–0.6)
Gamma Globulin: 0.7 g/dL — ABNORMAL LOW (ref 0.8–1.7)
Total Protein, Serum Electrophoresis: 6.3 g/dL (ref 6.1–8.1)

## 2017-06-05 MED ORDER — PREDNISONE 20 MG PO TABS
60.0000 mg | ORAL_TABLET | Freq: Every day | ORAL | 3 refills | Status: AC
Start: 2017-06-05 — End: ?

## 2017-06-05 MED ORDER — LEVETIRACETAM 250 MG PO TABS: 250.0000 mg | ORAL_TABLET | Freq: Two times a day (BID) | ORAL | 3 refills | Status: AC

## 2017-06-05 NOTE — Telephone Encounter (Signed)
I called Rodney Day from hospice, I will function as the attending physician, the orders for hospice will be activated.

## 2017-06-05 NOTE — Telephone Encounter (Signed)
I called the wife. The patient had what sounds like a generalized seizure yesterday when going to the bathroom. He does have low blood pressure, but the episodes started with stiffening first rather than slumping suggesting that it was a primary seizure event.  The sodium level in the emergency room was 128, down from 132. The patient is drinking Gatorade.  I will start the patient on low-dose Keppra, I will get him started on prednisone taking 60 mg daily until we get the blood work back.  The patient was given a referral to hospice, I think that this is appropriate. I suspect that the current process is likely to have a very high mortality rate.

## 2017-06-05 NOTE — Telephone Encounter (Signed)
Amy/[facility] [phone] called said the pt is requesting Dr Jannifer [name] be the attending physician. Please call to confirm

## 2017-06-05 NOTE — ED Notes (Signed)
Pt verbalized understanding discharge instructions and denies any further needs or questions at this time. VS stable, 

## 2017-06-08 ENCOUNTER — Telehealth: Payer: Self-pay | Admitting: Neurology

## 2017-06-08 DIAGNOSIS — G9340 Encephalopathy, unspecified: Secondary | ICD-10-CM | POA: Diagnosis not present

## 2017-06-08 DIAGNOSIS — C679 Malignant neoplasm of bladder, unspecified: Secondary | ICD-10-CM | POA: Diagnosis not present

## 2017-06-08 DIAGNOSIS — Z9181 History of falling: Secondary | ICD-10-CM | POA: Diagnosis not present

## 2017-06-08 DIAGNOSIS — N390 Urinary tract infection, site not specified: Secondary | ICD-10-CM | POA: Diagnosis not present

## 2017-06-08 DIAGNOSIS — N189 Chronic kidney disease, unspecified: Secondary | ICD-10-CM | POA: Diagnosis not present

## 2017-06-08 DIAGNOSIS — Z936 Other artificial openings of urinary tract status: Secondary | ICD-10-CM | POA: Diagnosis not present

## 2017-06-08 MED ORDER — ALPRAZOLAM 0.5 MG PO TABS: 0.5000 mg | ORAL_TABLET | Freq: Every evening | ORAL | 3 refills | Status: DC | PRN

## 2017-06-08 NOTE — Telephone Encounter (Signed)
Faxed printed/signed rx alprazolam to pt pharmacy. Fax: [phone]. Received confirmation.

## 2017-06-08 NOTE — Telephone Encounter (Signed)
I got a call concerning this patient, the family does not wish to enter into hospice, a palliative care consultation may be more reasonable at this point

## 2017-06-08 NOTE — Telephone Encounter (Signed)
I called the family. The patient is having agitation at night. We will give alprazolam 0.5 mg at night, if this worsens agitation, they are to contact our office.

## 2017-06-08 NOTE — Addendum Note (Signed)
Addended by: Kathrynn Ducking on: 06/08/2017 09:13 AM   Modules accepted: Orders

## 2017-06-08 NOTE — Telephone Encounter (Signed)
Pt wife calling stating pt is very hard to handle at night.  Pt wife asking for a prescription be called in for pt to take at night to help him sleep, please call

## 2017-06-09 ENCOUNTER — Telehealth: Payer: Self-pay | Admitting: *Deleted

## 2017-06-09 DIAGNOSIS — Z936 Other artificial openings of urinary tract status: Secondary | ICD-10-CM | POA: Diagnosis not present

## 2017-06-09 DIAGNOSIS — C679 Malignant neoplasm of bladder, unspecified: Secondary | ICD-10-CM | POA: Diagnosis not present

## 2017-06-09 DIAGNOSIS — G9340 Encephalopathy, unspecified: Secondary | ICD-10-CM | POA: Diagnosis not present

## 2017-06-09 DIAGNOSIS — N390 Urinary tract infection, site not specified: Secondary | ICD-10-CM | POA: Diagnosis not present

## 2017-06-09 DIAGNOSIS — Z9181 History of falling: Secondary | ICD-10-CM | POA: Diagnosis not present

## 2017-06-09 DIAGNOSIS — N189 Chronic kidney disease, unspecified: Secondary | ICD-10-CM | POA: Diagnosis not present

## 2017-06-09 NOTE — Telephone Encounter (Signed)
Give verbal Rodney Day

## 2017-06-09 NOTE — Telephone Encounter (Signed)
Rodney Day was given verbal order from Dr. Derrel Nip.

## 2017-06-09 NOTE — Telephone Encounter (Signed)
Please advise 

## 2017-06-09 NOTE — Telephone Encounter (Signed)
Sherri from [facility] reported :Pt had a fall earlier this week, pt has an abrasion to his left forearm. [facility] has requested orders  to treat this with a foam dressing.  Contact Sherri [phone]

## 2017-06-10 ENCOUNTER — Telehealth: Payer: Self-pay | Admitting: *Deleted

## 2017-06-10 ENCOUNTER — Telehealth: Payer: Self-pay | Admitting: Neurology

## 2017-06-10 ENCOUNTER — Encounter (HOSPITAL_COMMUNITY): Payer: Self-pay

## 2017-06-10 ENCOUNTER — Ambulatory Visit: Payer: Self-pay | Admitting: Neurology

## 2017-06-10 ENCOUNTER — Emergency Department (HOSPITAL_COMMUNITY): Payer: Medicare Other

## 2017-06-10 ENCOUNTER — Inpatient Hospital Stay (HOSPITAL_COMMUNITY)
Admission: EM | Admit: 2017-06-10 | Discharge: 2017-06-14 | DRG: 884 | Disposition: A | Discharge status: Skilled Nursing Facility | Payer: Medicare Other | Attending: Internal Medicine | Admitting: Internal Medicine

## 2017-06-10 DIAGNOSIS — C679 Malignant neoplasm of bladder, unspecified: Secondary | ICD-10-CM | POA: Diagnosis present

## 2017-06-10 DIAGNOSIS — Z66 Do not resuscitate: Secondary | ICD-10-CM | POA: Diagnosis present

## 2017-06-10 DIAGNOSIS — G3183 Dementia with Lewy bodies: Secondary | ICD-10-CM | POA: Diagnosis present

## 2017-06-10 DIAGNOSIS — F0390 Unspecified dementia without behavioral disturbance: Principal | ICD-10-CM | POA: Diagnosis present

## 2017-06-10 DIAGNOSIS — F0280 Dementia in other diseases classified elsewhere without behavioral disturbance: Secondary | ICD-10-CM | POA: Diagnosis not present

## 2017-06-10 DIAGNOSIS — R1310 Dysphagia, unspecified: Secondary | ICD-10-CM | POA: Diagnosis not present

## 2017-06-10 DIAGNOSIS — Z515 Encounter for palliative care: Secondary | ICD-10-CM | POA: Diagnosis present

## 2017-06-10 DIAGNOSIS — R4781 Slurred speech: Secondary | ICD-10-CM | POA: Diagnosis not present

## 2017-06-10 DIAGNOSIS — W19XXXA Unspecified fall, initial encounter: Secondary | ICD-10-CM

## 2017-06-10 DIAGNOSIS — E871 Hypo-osmolality and hyponatremia: Secondary | ICD-10-CM | POA: Diagnosis present

## 2017-06-10 DIAGNOSIS — G9340 Encephalopathy, unspecified: Secondary | ICD-10-CM | POA: Diagnosis not present

## 2017-06-10 DIAGNOSIS — I6789 Other cerebrovascular disease: Secondary | ICD-10-CM | POA: Diagnosis not present

## 2017-06-10 DIAGNOSIS — R4182 Altered mental status, unspecified: Secondary | ICD-10-CM | POA: Diagnosis not present

## 2017-06-10 DIAGNOSIS — Z8551 Personal history of malignant neoplasm of bladder: Secondary | ICD-10-CM

## 2017-06-10 DIAGNOSIS — S0083XA Contusion of other part of head, initial encounter: Secondary | ICD-10-CM

## 2017-06-10 DIAGNOSIS — Z79899 Other long term (current) drug therapy: Secondary | ICD-10-CM

## 2017-06-10 DIAGNOSIS — R569 Unspecified convulsions: Secondary | ICD-10-CM | POA: Diagnosis not present

## 2017-06-10 DIAGNOSIS — S0990XA Unspecified injury of head, initial encounter: Secondary | ICD-10-CM | POA: Diagnosis not present

## 2017-06-10 HISTORY — DX: Encephalopathy, unspecified: G93.40

## 2017-06-10 HISTORY — DX: Hypo-osmolality and hyponatremia: E87.1

## 2017-06-10 LAB — COMPREHENSIVE METABOLIC PANEL
ALT: 92 U/L — ABNORMAL HIGH (ref 17–63)
AST: 32 U/L (ref 15–41)
Albumin: 3.4 g/dL — ABNORMAL LOW (ref 3.5–5.0)
Alkaline Phosphatase: 58 U/L (ref 38–126)
Anion gap: 10 (ref 5–15)
BUN: 22 mg/dL — ABNORMAL HIGH (ref 6–20)
CO2: 22 mmol/L (ref 22–32)
Calcium: 9.3 mg/dL (ref 8.9–10.3)
Chloride: 101 mmol/L (ref 101–111)
Creatinine, Ser: 1.17 mg/dL (ref 0.61–1.24)
GFR calc Af Amer: >60 mL/min (ref >60)
GFR calc non Af Amer: >60 mL/min (ref >60)
Glucose, Bld: 94 mg/dL (ref 65–99)
Potassium: 3.5 mmol/L (ref 3.5–5.1)
Sodium: 133 mmol/L — ABNORMAL LOW (ref 135–145)
Total Bilirubin: 0.5 mg/dL (ref 0.3–1.2)
Total Protein: 5.8 g/dL — ABNORMAL LOW (ref 6.5–8.1)

## 2017-06-10 LAB — CBC
HCT: 39.3 % (ref 39.0–52.0)
Hemoglobin: 13.5 g/dL (ref 13.0–17.0)
MCH: 31.3 pg (ref 26.0–34.0)
MCHC: 34.4 g/dL (ref 30.0–36.0)
MCV: 91.2 fL (ref 78.0–100.0)
Platelets: 228 10*3/uL (ref 150–400)
RBC: 4.31 MIL/uL (ref 4.22–5.81)
RDW: 14 % (ref 11.5–15.5)
WBC: 8.6 10*3/uL (ref 4.0–10.5)

## 2017-06-10 LAB — URINALYSIS, ROUTINE W REFLEX MICROSCOPIC
Bilirubin Urine: NEGATIVE
Glucose, UA: NEGATIVE mg/dL
Ketones, ur: NEGATIVE mg/dL
Nitrite: NEGATIVE
Protein, ur: NEGATIVE mg/dL
Specific Gravity, Urine: 1.014 (ref 1.005–1.030)
pH: 6 (ref 5.0–8.0)

## 2017-06-10 LAB — CBG MONITORING, ED: Glucose-Capillary: 83 mg/dL (ref 65–99)

## 2017-06-10 MED ORDER — ACETAMINOPHEN 325 MG PO TABS: 650.0000 mg | ORAL_TABLET | Freq: Four times a day (QID) | ORAL | Status: DC | PRN

## 2017-06-10 MED ORDER — ALPRAZOLAM 0.5 MG PO TABS: 0.5000 mg | ORAL_TABLET | Freq: Every evening | ORAL | Status: DC | PRN

## 2017-06-10 MED ORDER — BOOST HIGH PROTEIN PO LIQD
1.0000 | Freq: Three times a day (TID) | ORAL | Status: DC
  Administered 2017-06-10 – 2017-06-14 (×9): 237 mL via ORAL
  Filled 2017-06-10 (×15): qty 237

## 2017-06-10 MED ORDER — ACETAMINOPHEN 650 MG RE SUPP: 650.0000 mg | Freq: Four times a day (QID) | RECTAL | Status: DC | PRN

## 2017-06-10 MED ORDER — ONDANSETRON HCL 4 MG/2ML IJ SOLN: 4.0000 mg | Freq: Four times a day (QID) | INTRAMUSCULAR | Status: DC | PRN

## 2017-06-10 MED ORDER — ALBUTEROL SULFATE HFA 108 (90 BASE) MCG/ACT IN AERS: 2.0000 | INHALATION_SPRAY | RESPIRATORY_TRACT | Status: DC | PRN

## 2017-06-10 MED ORDER — ENOXAPARIN SODIUM 40 MG/0.4ML ~~LOC~~ SOLN
40.0000 mg | SUBCUTANEOUS | Status: DC
  Administered 2017-06-10 – 2017-06-13 (×4): 40 mg via SUBCUTANEOUS
  Filled 2017-06-10 (×4): qty 0.4

## 2017-06-10 MED ORDER — ALBUTEROL SULFATE (2.5 MG/3ML) 0.083% IN NEBU: 2.5000 mg | INHALATION_SOLUTION | RESPIRATORY_TRACT | Status: DC | PRN

## 2017-06-10 MED ORDER — LEVETIRACETAM 250 MG PO TABS
250.0000 mg | ORAL_TABLET | Freq: Two times a day (BID) | ORAL | Status: DC
  Administered 2017-06-10 – 2017-06-14 (×7): 250 mg via ORAL
  Filled 2017-06-10 (×8): qty 1

## 2017-06-10 MED ORDER — CEPHALEXIN 250 MG PO CAPS
500.0000 mg | ORAL_CAPSULE | Freq: Once | ORAL | Status: AC
  Administered 2017-06-10: 500 mg via ORAL
  Filled 2017-06-10: qty 2

## 2017-06-10 MED ORDER — TRAZODONE HCL 100 MG PO TABS: 100.0000 mg | ORAL_TABLET | Freq: Every evening | ORAL | Status: DC | PRN

## 2017-06-10 MED ORDER — HALOPERIDOL LACTATE 5 MG/ML IJ SOLN
5.0000 mg | Freq: Four times a day (QID) | INTRAMUSCULAR | Status: DC | PRN
  Administered 2017-06-10: 5 mg via INTRAMUSCULAR
  Filled 2017-06-10: qty 1

## 2017-06-10 MED ORDER — HYDROXYZINE HCL 50 MG/ML IM SOLN: 100.0000 mg | Freq: Four times a day (QID) | INTRAMUSCULAR | Status: DC | PRN

## 2017-06-10 MED ORDER — ONDANSETRON HCL 4 MG PO TABS: 4.0000 mg | ORAL_TABLET | Freq: Four times a day (QID) | ORAL | Status: DC | PRN

## 2017-06-10 MED ORDER — LORAZEPAM 2 MG/ML IJ SOLN
1.0000 mg | Freq: Once | INTRAMUSCULAR | Status: AC
  Administered 2017-06-10: 1 mg via INTRAVENOUS
  Filled 2017-06-10: qty 1

## 2017-06-10 MED ORDER — SODIUM CHLORIDE 0.9 % IV SOLN
INTRAVENOUS | Status: AC
  Administered 2017-06-11: 01:00:00 via INTRAVENOUS

## 2017-06-10 NOTE — Telephone Encounter (Signed)
Pt wife calling because pt is in hospital due to multiple falls.  Pt wife states they are working with a Education officer, museum to try and get pt into [facility] nursing home (close by to home) pt wife said this is something they are needing help with right away.  Pt wife asking for any help that Dr. Jannifer [name] can give in accommodating this request

## 2017-06-10 NOTE — Progress Notes (Signed)
Daytime ED CM handoff concerning patient placement. Patient presented with fall at home, family feels patient will benefit from rehab and requesting placement.  FL2 has been completed.  PT eval done this evening recommendations for SNF. OT eval  Pending, and  will be done tomorrow.

## 2017-06-10 NOTE — ED Notes (Signed)
Phlebotomy at the bedside  

## 2017-06-10 NOTE — ED Notes (Signed)
MD Kohut at the bedside.  

## 2017-06-10 NOTE — Care Management Note (Signed)
Case Management Note  Patient Details  Name: Dugan Vanhoesen MRN: [id number] Date of Birth: 04-06-1946  Subjective/Objective:                  Daytime ED CM handoff concerning patient placement. Patient presented with fall at home, family feels patient will benefit from rehab and requesting placement.  FL2 has been completed, patient has had a 3 day qualifying per ED CSW..  PT eval done this evening recommendations for SNF. OT eval  Pending, and will be done tomorrow.  Hospitalist was consulted to evaluate patient. Decision made to bring patient into for observation stay. CM met with patient and family to discuss plan for patient to come under observations, CM explained Medicare Observation guideline status family agreeable verbalized understanding teach back done. Unit CSW will follow up with placement  Action/Plan:   Expected Discharge Date:                  Expected Discharge Plan:  Skilled Nursing Facility  In-House Referral:  Clinical Social Work  Discharge planning Services     Post Acute Care Choice:    Choice offered to:     DME Arranged:    DME Agency:     HH Arranged:    [place] Agency:     Status of Service:  Completed, signed off  If discussed at [employer], dates discussed:    Additional CommentsLaurena Slimmer, RN 06/10/2017, 8:51 PM

## 2017-06-10 NOTE — H&P (Signed)
History and Physical    Rodney Day ZRA:[id number] DOB: [birth year] DOA: 06/10/2017  PCP: Burnard Hawthorne, FNP Patient coming from: home  Chief Complaint: falls/confusion  HPI: Rodney Day is a 71 y.o. male with medical history significant bladder cancer status post cystectomy, complete with ureteroileal conduit, hypotension, delayed diagnosed rapidly progressive illness presents to emergency Department chief complaint of frequent falls and continued confusion. Family reports inability to continue to provide care.  Information is obtained from the wife and the son who are at the bedside. They state the last 6 weeks patient has gotten progressively more confused and weak leading to falls. Wife reports that during the day he is confused but cooperative and will attempt to follow directions but at night he becomes agitated combative wants to wander. Wife reports patient will not bear weight and this morning he was not strong enough to sit up straight on the commode and he fell off. No reports of any fever nausea vomiting diarrhea. She denies chest pain palpitation shortness of breath. He denies headache dizziness. Wife does report patient has frequent coughing when drinking thin liquids. States he will feed himself when presented with food but at this point is unable to make his once a needs known   ED Course: Emergency department he's afebrile hemodynamically stable and not hypoxic. Orthostatic vital signs positive. Family unable to provide care at home.  Review of Systems: As per HPI otherwise all other systems reviewed and are negative.   Ambulatory Status: Lives with his wife.  Past Medical History:  Diagnosis Date  . Encephalopathy   . Hyponatremia   . Malignant neoplasm of bladder [facility])     Past Surgical History:  Procedure Laterality Date  . ELECTROMAGNETIC NAVIGATION BROCHOSCOPY N/A 05/01/2017   Procedure: ELECTROMAGNETIC NAVIGATION BRONCHOSCOPY;  Surgeon: Flora Lipps, MD;  Location: [facility] ORS;  Service: Cardiopulmonary;  Laterality: N/A;  . ERCP N/A 04/09/2016   Procedure: ENDOSCOPIC RETROGRADE CHOLANGIOPANCREATOGRAPHY (ERCP);  Surgeon: Hulen Luster, MD;  Location: [facility] ENDOSCOPY;  Service: Gastroenterology;  Laterality: N/A;  . Pr Cystectomy,Ileal Conduit/Sigmoid Bladder  05/13/2013   CYSTECTOMY, COMPLETE, WITH URETEROILEAL CONDUIT OR SIGMOID BLADDER, INCLUDING INTESTINE ANASTOMOSIS; Surgeon: Ronnald Nian, MD  . Pr Remove Pelvis Lymph Nodes  05/13/2013   Procedure: PELVIC LYMPHADENECTOMY W/EXT ILIAC (SEPART PROC); Surgeon: Ronnald Nian, MD  . TRANSURETHRAL RESECTION OF BLADDER TUMOR      Social History   Social History  . Marital status: Married    Spouse name: N/A  . Number of children: 4  . Years of education: 9th   Occupational History  . Retired    Social History Main Topics  . Smoking status: Former Smoker    Packs/day: 0.25    Years: 51.00    Types: Cigarettes  . Smokeless tobacco: Never Used     Comment: 06/04/17 - not smoked in two months  . Alcohol use 0.0 oz/week     Comment: 1 beer every 2 weeks  . Drug use: No  . Sexual activity: Not on file   Other Topics Concern  . Not on file   Social History Narrative   Right-handed.   2 cups of caffeine per day.   Lives at home with his wife.    No Known Allergies  Family History  Problem Relation Age of Onset  . Bladder Cancer Father   . Prostate cancer Father   . COPD Father   . Stroke Paternal Uncle   . Dementia Mother   .  Diabetes Mother     Prior to Admission medications   Medication Sig Start Date End Date Taking? Authorizing Provider  ALPRAZolam Duanne Moron) 0.5 MG tablet Take 1 tablet (0.5 mg total) by mouth at bedtime as needed for anxiety. 06/08/17  Yes Kathrynn Ducking, MD  feeding supplement (BOOST HIGH PROTEIN) LIQD Take 1 Container by mouth 3 (three) times daily between meals.   Yes [provider]  Glycopyrrolate-Formoterol (BEVESPI AEROSPHERE)  9-4.8 MCG/ACT AERO Inhale 2 puffs into the lungs 2 (two) times daily. 04/28/17  Yes Flora Lipps, MD  levETIRAcetam (KEPPRA) 250 MG tablet Take 1 tablet (250 mg total) by mouth 2 (two) times daily. 06/05/17  Yes Kathrynn Ducking, MD  Multiple Vitamin (MULTIVITAMIN WITH MINERALS) TABS tablet Take 1 tablet by mouth daily.   Yes [provider]  predniSONE (DELTASONE) 20 MG tablet Take 3 tablets (60 mg total) by mouth daily. 06/05/17  Yes Kathrynn Ducking, MD  albuterol (PROVENTIL HFA;VENTOLIN HFA) 108 (90 Base) MCG/ACT inhaler Inhale 2 puffs into the lungs every 4 (four) hours as needed for wheezing or shortness of breath. 04/28/17   Flora Lipps, MD  ibuprofen (ADVIL,MOTRIN) 200 MG tablet Take 400 mg by mouth every 8 (eight) hours as needed for mild pain.     [provider]    Physical Exam: Vitals:   06/10/17 1630 06/10/17 1645 06/10/17 1700 06/10/17 1715  BP: 132/78 129/76 122/82 119/77  Pulse:      Resp: (!) 24  (!) 26 (!) 22  Temp:      TempSrc:      SpO2:      Weight:      Height:         General:  Appears calm and comfortable, frail and thin Eyes:  PERRL, EOMI, normal lids, iris ENT:  grossly normal hearing, lips & tongue, his membranes of his mouth are somewhat dry but pink Neck:  no LAD, masses or thyromegaly Cardiovascular:  RRR, no m/r/g. No LE edema.  Respiratory:  CTA bilaterally, no w/r/r. Normal respiratory effort. Abdomen:  soft, ntnd, positive throughout no guarding or rebounding Skin:  no rash or induration seen on limited exam Musculoskeletal:  grossly normal tone BUE/BLE, good ROM, no bony abnormality Psychiatric:  grossly normal mood and affect, speech fluent and appropriate, AOx3 Neurologic:  Alert oriented to self only, unable to follow simple commands, somewhat restless unable to make once a needs known  Labs on Admission: I have personally reviewed following labs and imaging studies  CBC:  Recent Labs Lab 06/04/17 2102 06/10/17 0830    WBC 5.2 8.6  NEUTROABS 2.9  --   HGB 11.8* 13.5  HCT 34.8* 39.3  MCV 89.0 91.2  PLT 247 893   Basic Metabolic Panel:  Recent Labs Lab 06/04/17 2102 06/10/17 0830  NA 128* 133*  K 3.9 3.5  CL 94* 101  CO2 27 22  GLUCOSE 107* 94  BUN 20 22*  CREATININE 1.34* 1.17  CALCIUM 8.9 9.3   GFR: Estimated Creatinine Clearance: 64.5 mL/min (by C-G formula based on SCr of 1.17 mg/dL). Liver Function Tests:  Recent Labs Lab 06/04/17 0837 06/04/17 2102 06/10/17 0830  AST  --  15 32  ALT  --  16* 92*  ALKPHOS  --  67 58  BILITOT  --  0.5 0.5  PROT 7.1 6.1* 5.8*  ALBUMIN  --  3.5 3.4*   No results for input(s): LIPASE, AMYLASE in the last 168 hours. No results for  input(s): AMMONIA in the last 168 hours. Coagulation Profile: No results for input(s): INR, PROTIME in the last 168 hours. Cardiac Enzymes: No results for input(s): CKTOTAL, CKMB, CKMBINDEX, TROPONINI in the last 168 hours. BNP (last 3 results) No results for input(s): PROBNP in the last 8760 hours. HbA1C: No results for input(s): HGBA1C in the last 72 hours. CBG:  Recent Labs Lab 06/04/17 2119 06/10/17 0843  GLUCAP 103* 83   Lipid Profile: No results for input(s): CHOL, HDL, LDLCALC, TRIG, CHOLHDL, LDLDIRECT in the last 72 hours. Thyroid Function Tests: No results for input(s): TSH, T4TOTAL, FREET4, T3FREE, THYROIDAB in the last 72 hours. Anemia Panel: No results for input(s): VITAMINB12, FOLATE, FERRITIN, TIBC, IRON, RETICCTPCT in the last 72 hours. Urine analysis:    Component Value Date/Time   COLORURINE YELLOW 06/10/2017 1113   APPEARANCEUR HAZY (A) 06/10/2017 1113   APPEARANCEUR Turbid 05/02/2013 2204   LABSPEC 1.014 06/10/2017 1113   LABSPEC 1.013 05/02/2013 2204   PHURINE 6.0 06/10/2017 1113   GLUCOSEU NEGATIVE 06/10/2017 1113   GLUCOSEU Negative 05/02/2013 2204   HGBUR SMALL (A) 06/10/2017 1113   BILIRUBINUR NEGATIVE 06/10/2017 1113   BILIRUBINUR Negative 05/02/2013 [place] 06/10/2017 1113   PROTEINUR NEGATIVE 06/10/2017 1113   NITRITE NEGATIVE 06/10/2017 1113   LEUKOCYTESUR TRACE (A) 06/10/2017 1113   LEUKOCYTESUR 3+ 05/02/2013 2204    Creatinine Clearance: Estimated Creatinine Clearance: 64.5 mL/min (by C-G formula based on SCr of 1.17 mg/dL).  Sepsis Labs: @LABRCNTIP (procalcitonin:4,lacticidven:4) )No results found for this or any previous visit (from the past 240 hour(s)).   Radiological Exams on Admission: Ct Head Wo Contrast  Result Date: 06/10/2017 CLINICAL DATA:  Status post fall.  Disoriented. EXAM: CT HEAD WITHOUT CONTRAST TECHNIQUE: Contiguous axial images were obtained from the base of the skull through the vertex without intravenous contrast. COMPARISON:  None. FINDINGS: Brain: No evidence of acute infarction, hemorrhage, extra-axial collection, ventriculomegaly, or mass effect. Generalized cerebral atrophy. Periventricular white matter low attenuation likely secondary to microangiopathy. Vascular: Cerebrovascular atherosclerotic calcifications are noted. Skull: Negative for fracture or focal lesion. Sinuses/Orbits: Visualized portions of the orbits are unremarkable. Visualized portions of the paranasal sinuses and mastoid air cells are unremarkable. Other: None. IMPRESSION: 1. No acute intracranial pathology. 2. Chronic microvascular disease and cerebral atrophy. Electronically Signed   By: Kathreen Devoid   On: 06/10/2017 12:44    EKG: Independently reviewed. Sinus rhythm Ventricular premature complex  Assessment/Plan Principal Problem:   Encephalopathy Active Problems:   Bladder cancer (HCC)   Hyponatremia   Dysphagia   #1. Encephalopathy. Etiology unclear. Urinalysis consistent with urine collected from urostomy bag. Note evaluated by urology to weeks ago who opined not likely patient ever had a urinary tract infection and that his mental status issues are unrelated to "recurrent UTIs ". (see urology note dated 05/20/2017) No other  signs of infection no metabolic derangement. Has been hospitalized and worked up in the recent past. Normal ammonia level, negative for syphilis, B-12 within the limits of normal. According to neurology progress note EEG done last hospitalization showed triphasic waves. LP was spinal fluid revealed elevated protein and oligoclonal banding. Neurology evaluated last week and opined patient with rapidly progressive dementia. Further blood work done considering entities such as CJD paraneoplastic syndrome, lupus, sarcoidosis. See note dated 06/04/2017 Mini-Mental exam score 19 out of 30.  -Admit to medical floor -follow urine culture -Actuary -Social worker placement -Haldol  #2. Dysphasia. Wife reports a frequent cough. Patient also clear his throat  and coughs when drinking liquids -Speech therapy for swallow eval  #3. Hyponatremia. Mild. Sodium level 133     DVT prophylaxis: scd Code Status: full  Family Communication: wife and son  Disposition Plan: to facility tomorrow  Consults called: noe  Admission status: obs    Radene Gunning MD [facility]  If 7PM-7AM, please contact night-coverage [email or web address] Password [facility]  06/10/2017, 6:06 PM

## 2017-06-10 NOTE — Telephone Encounter (Signed)
Gave completed/signed FMLA form for son to medical records to process.

## 2017-06-10 NOTE — ED Notes (Signed)
Social work at bedside.  

## 2017-06-10 NOTE — Clinical Social Work Placement (Signed)
   CLINICAL SOCIAL WORK PLACEMENT  NOTE  Date:  06/10/2017  Patient Details  Name: Rodney Day MRN: [id number] Date of Birth: [birth year]  Clinical Social Work is seeking post-discharge placement for this patient at the [place] level of care (*CSW will initial, date and re-position this form in  chart as items are completed):  Yes   Patient/family provided with [place] Work Department's list of facilities offering this level of care within the geographic area requested by the patient (or if unable, by the patient's family).  Yes   Patient/family informed of their freedom to choose among providers that offer the needed level of care, that participate in Medicare, Medicaid or managed care program needed by the patient, have an available bed and are willing to accept the patient.  Yes   Patient/family informed of [facility]'s ownership interest in [facility] and [facility], as well as of the fact that they are under no obligation to receive care at these facilities.  PASRR submitted to EDS on       PASRR number received on       Existing PASRR number confirmed on 06/10/17     FL2 transmitted to all facilities in geographic area requested by pt/family on 06/10/17     FL2 transmitted to all facilities within larger geographic area on       Patient informed that his/her managed care company has contracts with or will negotiate with certain facilities, including the following:            Patient/family informed of bed offers received.  Patient chooses bed at       Physician recommends and patient chooses bed at      Patient to be transferred to   on  .  Patient to be transferred to facility by       Patient family notified on   of transfer.  Name of family member notified:        PHYSICIAN       Additional Comment:    _______________________________________________ Roanna Raider, LCSW 06/10/2017, 8:16 PM

## 2017-06-10 NOTE — ED Provider Notes (Addendum)
[place] DEPT Provider Note   CSN: [id number] Arrival date & time: 06/10/17  4315     History   Chief Complaint Chief Complaint  Patient presents with  . Fall  . Altered Mental Status    HPI Rodney Day is a 71 y.o. male.  HPI   42yM with fall. Was sitting on toilet and fell forward this morning. Struck head. No reported LOC. Pt denies any acute pain. He does seem somewhat confused that can relate the events of the fall. No vomiting. No reported med changes. Wife at bedside and reports no clear etiology of his confused state. Has been worked-up/admitted several times. Family is in process of pursuing placement.   Past Medical History:  Diagnosis Date  . Malignant neoplasm of bladder [facility])     Patient Active Problem List   Diagnosis Date Noted  . AKI (acute kidney injury) ([place])   . Encephalopathy 05/19/2017  . Acute on chronic kidney failure ([place]) 05/19/2017  . Complicated UTI (urinary tract infection) 05/05/2017  . Adenopathy   . Lung mass 04/16/2017  . Cholelithiasis with cholecystitis or obstruction 03/29/2016  . Bladder cancer ([place]) 05/25/2015    Past Surgical History:  Procedure Laterality Date  . ELECTROMAGNETIC NAVIGATION BROCHOSCOPY N/A 05/01/2017   Procedure: ELECTROMAGNETIC NAVIGATION BRONCHOSCOPY;  Surgeon: Flora Lipps, MD;  Location: [facility] ORS;  Service: Cardiopulmonary;  Laterality: N/A;  . ERCP N/A 04/09/2016   Procedure: ENDOSCOPIC RETROGRADE CHOLANGIOPANCREATOGRAPHY (ERCP);  Surgeon: Hulen Luster, MD;  Location: [facility] ENDOSCOPY;  Service: Gastroenterology;  Laterality: N/A;  . Pr Cystectomy,Ileal Conduit/Sigmoid Bladder  05/13/2013   CYSTECTOMY, COMPLETE, WITH URETEROILEAL CONDUIT OR SIGMOID BLADDER, INCLUDING INTESTINE ANASTOMOSIS; Surgeon: Ronnald Nian, MD  . Pr Remove Pelvis Lymph Nodes  05/13/2013   Procedure: PELVIC LYMPHADENECTOMY W/EXT ILIAC (SEPART PROC); Surgeon: Ronnald Nian, MD  . TRANSURETHRAL RESECTION OF BLADDER TUMOR          Home Medications    Prior to Admission medications   Medication Sig Start Date End Date Taking? Authorizing Provider  ALPRAZolam Duanne Moron) 0.5 MG tablet Take 1 tablet (0.5 mg total) by mouth at bedtime as needed for anxiety. 06/08/17  Yes Kathrynn Ducking, MD  feeding supplement (BOOST HIGH PROTEIN) LIQD Take 1 Container by mouth 3 (three) times daily between meals.   Yes [provider]  Glycopyrrolate-Formoterol (BEVESPI AEROSPHERE) 9-4.8 MCG/ACT AERO Inhale 2 puffs into the lungs 2 (two) times daily. 04/28/17  Yes Flora Lipps, MD  levETIRAcetam (KEPPRA) 250 MG tablet Take 1 tablet (250 mg total) by mouth 2 (two) times daily. 06/05/17  Yes Kathrynn Ducking, MD  Multiple Vitamin (MULTIVITAMIN WITH MINERALS) TABS tablet Take 1 tablet by mouth daily.   Yes [provider]  predniSONE (DELTASONE) 20 MG tablet Take 3 tablets (60 mg total) by mouth daily. 06/05/17  Yes Kathrynn Ducking, MD  albuterol (PROVENTIL HFA;VENTOLIN HFA) 108 (90 Base) MCG/ACT inhaler Inhale 2 puffs into the lungs every 4 (four) hours as needed for wheezing or shortness of breath. 04/28/17   Flora Lipps, MD  feeding supplement, ENSURE ENLIVE, (ENSURE ENLIVE) LIQD Take 237 mLs by mouth 3 (three) times daily between meals. Patient not taking: Reported on 06/10/2017 05/23/17   Loletha Grayer, MD  ibuprofen (ADVIL,MOTRIN) 200 MG tablet Take 400 mg by mouth every 8 (eight) hours as needed for mild pain.     [provider]  methylphenidate (RITALIN) 10 MG tablet Take 1 tablet (10 mg total) by mouth 2 (two) times  daily with breakfast and lunch. Patient not taking: Reported on 06/10/2017 05/23/17 05/23/18  Loletha Grayer, MD    Family History Family History  Problem Relation Age of Onset  . Bladder Cancer Father   . Prostate cancer Father   . COPD Father   . Stroke Paternal Uncle   . Dementia Mother   . Diabetes Mother     Social History Social History  Substance Use Topics  . Smoking  status: Former Smoker    Packs/day: 0.25    Years: 51.00    Types: Cigarettes  . Smokeless tobacco: Never Used     Comment: 06/04/17 - not smoked in two months  . Alcohol use 0.0 oz/week     Comment: 1 beer every 2 weeks     Allergies   Patient has no known allergies.   Review of Systems Review of Systems  All systems reviewed and negative, other than as noted in HPI.  Physical Exam Updated Vital Signs BP 133/79   Pulse 62   Temp 97.6 F (36.4 C) (Oral)   Resp (!) 21   Ht 6' (1.829 m)   Wt 79.4 kg (175 lb)   SpO2 97%   BMI 23.73 kg/m   Physical Exam  Constitutional: He appears well-developed and well-nourished. No distress.  HENT:  Head: Normocephalic.  Abrasion to bridge of nose  Eyes: Conjunctivae are normal. Right eye exhibits no discharge. Left eye exhibits no discharge.  Neck: Neck supple.  Cardiovascular: Normal rate, regular rhythm and normal heart sounds.  Exam reveals no gallop and no friction rub.   No murmur heard. Pulmonary/Chest: Effort normal and breath sounds normal. No respiratory distress.  Abdominal: Soft. He exhibits no distension. There is no tenderness.  Musculoskeletal: He exhibits no edema or tenderness.  Neurological: He is alert.  Speech is dysarthric but understandable. Able to tle me it's Wednesday, the Month is June and he is in [facility] hospital. Reported year as 1989 and couldn't give me correct day of month. R pupil perhaps mildly larger than L. Both reactive. Has some difficulty with following commands and needs prompting several times. CN 2-12 seem intact though. Strength 5/5 b/l U/L extremities.   Skin: Skin is warm and dry.  Psychiatric: He has a normal mood and affect. His behavior is normal. Thought content normal.  Nursing note and vitals reviewed.    ED Treatments / Results  Labs (all labs ordered are listed, but only abnormal results are displayed) Labs Reviewed  COMPREHENSIVE METABOLIC PANEL - Abnormal; Notable for the  following:       Result Value   Sodium 133 (*)    BUN 22 (*)    Total Protein 5.8 (*)    Albumin 3.4 (*)    ALT 92 (*)    All other components within normal limits  URINALYSIS, ROUTINE W REFLEX MICROSCOPIC - Abnormal; Notable for the following:    APPearance HAZY (*)    Hgb urine dipstick SMALL (*)    Leukocytes, UA TRACE (*)    Bacteria, UA MANY (*)    Squamous Epithelial / LPF 0-5 (*)    All other components within normal limits  URINE CULTURE  CBC  CBG MONITORING, ED    EKG  EKG Interpretation  Date/Time:  Wednesday June 10 2017 08:24:43 EDT Ventricular Rate:  67 PR Interval:    QRS Duration: 96 QT Interval:  413 QTC Calculation: 436 R Axis:   -2 Text Interpretation:  Sinus rhythm Ventricular premature complex Confirmed  by Wilson Singer  MD, [place] [phone]) on 06/10/2017 11:04:33 AM       Radiology Ct Head Wo Contrast  Result Date: 06/10/2017 CLINICAL DATA:  Status post fall.  Disoriented. EXAM: CT HEAD WITHOUT CONTRAST TECHNIQUE: Contiguous axial images were obtained from the base of the skull through the vertex without intravenous contrast. COMPARISON:  None. FINDINGS: Brain: No evidence of acute infarction, hemorrhage, extra-axial collection, ventriculomegaly, or mass effect. Generalized cerebral atrophy. Periventricular white matter low attenuation likely secondary to microangiopathy. Vascular: Cerebrovascular atherosclerotic calcifications are noted. Skull: Negative for fracture or focal lesion. Sinuses/Orbits: Visualized portions of the orbits are unremarkable. Visualized portions of the paranasal sinuses and mastoid air cells are unremarkable. Other: None. IMPRESSION: 1. No acute intracranial pathology. 2. Chronic microvascular disease and cerebral atrophy. Electronically Signed   By: Kathreen Devoid   On: 06/10/2017 12:44    Procedures Procedures (including critical care time)  Medications Ordered in ED Medications  cephALEXin (KEFLEX) capsule 500 mg (not administered)   LORazepam (ATIVAN) injection 1 mg (not administered)     Initial Impression / Assessment and Plan / ED Course  I have reviewed the triage vital signs and the nursing notes.  Pertinent labs & imaging results that were available during my care of the patient were reviewed by me and considered in my medical decision making (see chart for details).  71yM with fall. Multiple recent falls. Recent work-ups and evaluation of confusion w/o exact etiology. Wife reports currrent mental state is pretty consistent with how he has been for the past week to months. Will CT head. Basic labs.   1:52 PM Extended time spent at bedside speaking with pt's wife an son. Understandably upset. They are frustratd and feel like they cannot adequately take care of the patient at home. I acknowledged their concerns and explained that social work would meet with them. I explained that at this time I do not have a medical reason for admission to the hospital. Possibly has UTI but can't reliably interpret UA from urostomy. Family reports that he has been treated for UTI more than once recently with no improvement of his symptoms.   They are requesting something to "calm him down." He does occasionally try to get up from the bed but he is easily redirectable. Ordered some ativan.   3:37 PM Further discussion with family. Their concerns are very legitimate. Unfortunately this is primarily a social work issue at this moment and there usually are not quick solutions. They are now irate. "I'm going to take this to [facility]." Social work is trying to place him. Nursing and myself have been in numerous times to speak with them. I explained that I can discuss with hospitalist service for admission for observation but that it wouldn't qualify as admission for placement purposes.  Family requesting additional medication for patient. He appeared to be sleeping  when I was the room this past time. Even when he is awake he has been  redirectable.   Final Clinical Impressions(s) / ED Diagnoses   Final diagnoses:  Fall, initial encounter  Contusion of face, initial encounter    New Prescriptions New Prescriptions   No medications on file     Virgel Manifold, MD 06/10/17 1458    Virgel Manifold, MD 06/20/17 1747

## 2017-06-10 NOTE — ED Notes (Signed)
Spoke with Education officer, museum, made aware of need for consult regarding nursing home placement. Reports she will be down to talk with family.

## 2017-06-10 NOTE — ED Notes (Signed)
Patient transported to CT 

## 2017-06-10 NOTE — ED Notes (Signed)
Pt trying to get out of bed, is redirectable. RN at bedside, pt will be resting, when leaves room, family reports he is up trying to get out of bed. Family frustrated, social worker to work with family to get patient placed in facility. Dr. Wilson Singer has been to bedside to update family.

## 2017-06-10 NOTE — ED Triage Notes (Signed)
Per [facility], Pt is coming from home with complaints of fall this morning off of the toilet. Pt fell forward and hit his nose, unsure of where. Pt was able to recall the whole event to EMS, and was alert and oriented x4. Pt has skin tear to the left forearm and abrasion to nose. When arrived to the ED, Pt was alert to self and place, but was disoriented on situation and time. Pt stated he fell, but could not say how and kept stating he was 71 years old. Pt is lethargic and slurred speech per baseline. Denies any blood thinners. Vitals per EMS: 98/62, Standing 78/50, Last 131/78, 68 Irregular HR, 94 CBG, 99% on RA.

## 2017-06-10 NOTE — Care Management Obs Status (Signed)
[place] NOTIFICATION   Patient Details  Name: Rodney Day MRN: [id number] Date of Birth: 06-Mar-1946   Medicare Observation Status Notification Given:  Ernesta Amble, RN 06/10/2017, 9:54 PM

## 2017-06-10 NOTE — NC FL2 (Signed)
[place] LEVEL OF CARE SCREENING TOOL     IDENTIFICATION  Patient Name: Rodney Day Birthdate: Feb 20, 1946 Sex: male Admission Date (Current Location): 06/10/2017  [facility] and [place] Number:  Herbalist and Address:  [facility] [employer], [place] [address] [place], [place] [address]      Provider Number: [id number]  Attending Physician Name and Address:  Virgel Manifold, MD  Relative Name and Phone Number:  Dmarcus Decicco, wife [phone]) and Bryen Hinderman (son) [phone].    Current Level of Care: Hospital Recommended Level of Care: [place] Prior Approval Number:    Date Approved/Denied:   PASRR Number: [id number]  Discharge Plan: SNF    Current Diagnoses: Patient Active Problem List   Diagnosis Date Noted  . AKI (acute kidney injury) ([place])   . Encephalopathy 05/19/2017  . Acute on chronic kidney failure ([place]) 05/19/2017  . Complicated UTI (urinary tract infection) 05/05/2017  . Adenopathy   . Lung mass 04/16/2017  . Cholelithiasis with cholecystitis or obstruction 03/29/2016  . Bladder cancer ([place]) 05/25/2015    Orientation RESPIRATION BLADDER Height & Weight     Self  Normal Continent Weight: 175 lb (79.4 kg) Height:  6' (182.9 cm)  BEHAVIORAL SYMPTOMS/MOOD NEUROLOGICAL BOWEL NUTRITION STATUS      Continent Diet (Regular)  AMBULATORY STATUS COMMUNICATION OF NEEDS Skin   Extensive Assist (Unstable while ambulating. High fall risk) Verbally Normal                       Personal Care Assistance Level of Assistance  Bathing, Feeding, Dressing Bathing Assistance: Maximum assistance Feeding assistance: Independent Dressing Assistance: Maximum assistance     Functional Limitations Info  Sight, Hearing, Speech Sight Info: Adequate Hearing Info: Adequate Speech Info: Adequate (Patient is verbal but communicate is disjointed)    SPECIAL CARE FACTORS FREQUENCY                        Contractures Contractures Info: Not present    Additional Factors Info  Code Status, Allergies Code Status Info: Full Allergies Info: No known allergies           Current Medications (06/10/2017):  This is the current hospital active medication list No current facility-administered medications for this encounter.    Current Outpatient Prescriptions  Medication Sig Dispense Refill  . ALPRAZolam (XANAX) 0.5 MG tablet Take 1 tablet (0.5 mg total) by mouth at bedtime as needed for anxiety. 30 tablet 3  . feeding supplement (BOOST HIGH PROTEIN) LIQD Take 1 Container by mouth 3 (three) times daily between meals.    . Glycopyrrolate-Formoterol (BEVESPI AEROSPHERE) 9-4.8 MCG/ACT AERO Inhale 2 puffs into the lungs 2 (two) times daily. 10.7 g 5  . levETIRAcetam (KEPPRA) 250 MG tablet Take 1 tablet (250 mg total) by mouth 2 (two) times daily. 60 tablet 3  . Multiple Vitamin (MULTIVITAMIN WITH MINERALS) TABS tablet Take 1 tablet by mouth daily.    . predniSONE (DELTASONE) 20 MG tablet Take 3 tablets (60 mg total) by mouth daily. 90 tablet 3  . albuterol (PROVENTIL HFA;VENTOLIN HFA) 108 (90 Base) MCG/ACT inhaler Inhale 2 puffs into the lungs every 4 (four) hours as needed for wheezing or shortness of breath. 1 Inhaler 6  . feeding supplement, ENSURE ENLIVE, (ENSURE ENLIVE) LIQD Take 237 mLs by mouth 3 (three) times daily between meals. (Patient not taking: Reported on 06/10/2017) 90 Bottle 0  . ibuprofen (  ADVIL,MOTRIN) 200 MG tablet Take 400 mg by mouth every 8 (eight) hours as needed for mild pain.     . methylphenidate (RITALIN) 10 MG tablet Take 1 tablet (10 mg total) by mouth 2 (two) times daily with breakfast and lunch. (Patient not taking: Reported on 06/10/2017) 60 tablet 0     Discharge Medications: Please see discharge summary for a list of discharge medications.  Relevant Imaging Results:  Relevant Lab Results:   Additional Information [phone].  Sable Feil, LCSW

## 2017-06-10 NOTE — ED Notes (Signed)
[employer] [facility] [place]

## 2017-06-10 NOTE — Clinical Social Work Note (Signed)
Clinical Social Work Assessment  Patient Details  Name: Rodney Day MRN: [id number] Date of Birth: 02-May-1946  Date of referral:  06/10/17               Reason for consult:  Facility Placement                Permission sought to share information with:  Family Supports Permission granted to share information::  No (Patient disoriented)  Name::     [place] and [place]::     Relationship::  Wife and son  Contact Information:  Mrs. Zalesky - [phone] and Aaron Edelman [phone]  Housing/Transportation Living arrangements for the past 2 months:  [place] of Information:  Spouse, Adult Children Patient Interpreter Needed:  None Criminal Activity/Legal Involvement Pertinent to Current Situation/Hospitalization:  No - Comment as needed Significant Relationships:  Adult Children, Spouse Lives with:  Spouse, Adult Children (Son Legrand Como (mentally handicapped) lives in the home) Do you feel safe going back to the place where you live?  No Need for family participation in patient care:  Yes (Comment)  Care giving concerns:  Wife and son reported that patient needs 24/7 care, is a high fall risk and they can no longer care for him at home.   Social Worker assessment / plan:  CSW called to ED to talk with family regarding placement for patient. Mrs. [place] was very tearful as she talked with CSW about needing to place her husband. Per wife and son, patient has been extremely hard to handle at home since March '18, but caring for him has gotten really bad since May. Per family patient cannot walk without hands on assistance and he is constantly trying to get up and walk unassisted.  Family reported that patient has been in and out of the hospital and ED in the past 3 weeks.  Aaron Edelman indicated that his dad is able to communicate but what he says makes no sense, and this was witnessed by CSW. Aaron Edelman reported that he lives next door but has been staying at his parent's  home to help care for his dad. Aaron Edelman also expressed that he has a job and can't be out of work a lot. CSW advised that family has been trying to get patient placed without success. CSW advised that there is another son, Legrand Como who lives at home and is mentally handicapped per [place]. The family's facility preference is [facility] as they live near that facility.  Employment status:  Retired Forensic scientist:  Medicare PT Recommendations:  Not assessed at this time [place] / Referral to community resources:  [name] (Family provided with SNF list for [facility])  Patient/Family's Response to care:  No concerns expressed by family regarding care at hospital.  Patient/Family's Understanding of and Emotional Response to Diagnosis, Current Treatment, and Prognosis:  Family very concerned about what is going on physically and mentally with the patient and expressed frustration with getting no answers from doctors.  Emotional Assessment Appearance:  Appears stated age Attitude/Demeanor/Rapport:  Other (Quiet, confused, AMS) Affect (typically observed):  Quiet, Other Orientation:  Oriented to Self Alcohol / Substance use:  Tobacco Use, Alcohol Use, Illicit Drugs (Patient drnks one beer approx. every 2 weeks. Patient smokes and does not use illicit drugs.) Psych involvement (Current and /or in the community):  No (Comment)  Discharge Needs  Concerns to be addressed:  Discharge Planning Concerns, Home Safety Concerns Readmission within the last 30  days:  No (Patient in the ED) Current discharge risk:  Other (Patient unsafe at home due to frequent falls. Family unable to care for him) Barriers to Discharge:  Unsafe home situation, Other (Locating a facility that will accept patient.)   Sable Feil, LCSW 06/10/2017, 3:14 PM

## 2017-06-10 NOTE — Telephone Encounter (Signed)
I called patient. I talk with the wife. The patient went to the hospital, the patient has fallen multiple times at home, he sustained injury to his face with the last fall.  The wife can no longer handle him at home, the patient is a very significant fall risk, he has had a rapidly progressive decline in his mental status.  It is not clear whether the hospital is going to admit him overnight while they try to get an extended care facility bed. The patient is very difficult to manage at home safely at this time.

## 2017-06-10 NOTE — Telephone Encounter (Signed)
The NMDA receptor antibody test was negative.

## 2017-06-10 NOTE — ED Notes (Signed)
Pt resting comfortably

## 2017-06-10 NOTE — Evaluation (Addendum)
Physical Therapy Evaluation Patient Details Name: Rodney Day MRN: [id number] DOB: 18-Dec-1945 Today's Date: 06/10/2017   History of Present Illness  71 y.o. male came to the emergency room on 06/10/17 due to fall off of the commode at home.  CT negative. Pt has fallen many times at home and had a recent cognitive decline for which he is being followed by Dr. Jannifer [name] (neurologist).  Pt with significant PMH of bladder CA, resection of bladder tumor, ERCP, Cystectomy (bag on right lower quadrant).  Clinical Impression  Pt is a significant fall risk, requiring two person assist to get to his feet, and turn to sit on the 4 wheeled RW.  He is strong, yet off balance and completely unaware of his deficits or safety.  He has had repeated falls at home and refuses to use a RW (although, honestly, at this point it may be more of an obstacle than helpful).  He did have (+) orthostatic BPs and was lightheaded and pale upon standing.    06/10/17 1742  Vitals  Patient Position (if appropriate) Orthostatic Vitals  Orthostatic Lying   BP- Lying 132/78  Pulse- Lying 88  Orthostatic Sitting  BP- Sitting 129/76  Orthostatic Standing at 0 minutes  BP- Standing at 0 minutes 104/73   Pt is appropriate for SNF level rehab at discharge.   PT to follow acutely for deficits listed below.       Follow Up Recommendations SNF;Supervision/Assistance - 24 hour    Equipment Recommendations  None recommended by PT    Recommendations for Other Services OT consult     Precautions / Restrictions Precautions Precautions: Fall Precaution Comments: multiple falls every week      Mobility  Bed Mobility Overal bed mobility: Needs Assistance Bed Mobility: Supine to Sit     Supine to sit: Mod assist     General bed mobility comments: mod assist to prevent pt from coming off of the side of the bed due to decreased awareness of where the edge is and then trying to come to standing in one move vs listening to  therapist who is physically trying to prevent him from standing.   Transfers Overall transfer level: Needs assistance Equipment used: 4-wheeled walker Transfers: Sit to/from Stand Sit to Stand: +2 physical assistance;Min assist         General transfer comment: Two person min assist for safety due to impulsivity.  Son was helping.  Pt stood to 4 wheeled RW and immediately turned sideways as if he was going to walk like that.   Ambulation/Gait             General Gait Details: unable to walk because of pt's poor problem solving.  He also reported lightheadedness and sat down seconds after standing up to the walker with the seat.          Balance Overall balance assessment: Needs assistance Sitting-balance support: Feet supported;Bilateral upper extremity supported Sitting balance-Leahy Scale: Fair Sitting balance - Comments: needs close supervision EOB due to impulsivity   Standing balance support: Bilateral upper extremity supported Standing balance-Leahy Scale: Poor Standing balance comment: needs assist for balance and safety when on his feet even with support from RW.  May do better with two person hand held assist.                              Pertinent Vitals/Pain Pain Assessment: No/denies pain Pain Score: 0-No pain  Home Living Family/patient expects to be discharged to:: Private residence Living Arrangements: Spouse/significant other;Children Available Help at Discharge: Family;Available 24 hours/day Type of Home: House Home Access: Ramped entrance     Home Layout: Able to live on main level with bedroom/bathroom;Two level Home Equipment: Walker - 2 wheels;Cane - single point;Bedside commode Additional Comments: Pt's wife reports they are active with [facility] for an aide who comes 3 days per week to sponge bathe him.      Prior Function Level of Independence: Needs assistance   Gait / Transfers Assistance Needed: Family says he refuses to use a  walker at home, he pushes past the RW and falls, even with assistance.  Son has started staying with them becasue wife cannot physically handle the husband.               Extremity/Trunk Assessment   Upper Extremity Assessment Upper Extremity Assessment: Defer to OT evaluation    Lower Extremity Assessment Lower Extremity Assessment: Overall WFL for tasks assessed (pt is very strong, decreased sensation in feet bil)    Cervical / Trunk Assessment Cervical / Trunk Assessment: Normal  Communication   Communication: Other (comment) (mumbling at times, non-sensical)  Cognition Arousal/Alertness: Lethargic Behavior During Therapy: Impulsive;Restless Overall Cognitive Status: Impaired/Different from baseline Area of Impairment: Orientation;Attention;Memory;Following commands;Safety/judgement;Awareness;Problem solving                 Orientation Level: Disoriented to;Place;Time;Situation Current Attention Level: Focused Memory: Decreased recall of precautions;Decreased short-term memory Following Commands: Follows one step commands inconsistently Safety/Judgement: Decreased awareness of safety;Decreased awareness of deficits Awareness: Intellectual Problem Solving: Difficulty sequencing;Requires verbal cues;Requires tactile cues General Comments: Pt letargic, yet restless, trying to get up, moving quickly, not following commands or safety cues. Aware of his family and able to call them by name, but not much else.          Exercises Other Exercises Other Exercises: orthostatics were (+).  Wife reports this has been an issue in the past.    Assessment/Plan    PT Assessment Patient needs continued PT services  PT Problem List Decreased balance;Decreased mobility;Decreased coordination;Decreased cognition;Decreased knowledge of use of DME;Decreased safety awareness;Decreased knowledge of precautions;Impaired sensation       PT Treatment Interventions DME instruction;Gait  training;Functional mobility training;Therapeutic activities;Therapeutic exercise;Balance training;Neuromuscular re-education;Cognitive remediation;Patient/family education    PT Goals (Current goals can be found in the Care Plan section)  Acute Rehab PT Goals Patient Stated Goal: unable to state, wife and son want placement because they cannot safely care for him at home  PT Goal Formulation: With family Time For Goal Achievement: 06/24/17 Potential to Achieve Goals: Good    Frequency Min 2X/week    AM-PAC PT "6 Clicks" Daily Activity  Outcome Measure Difficulty turning over in bed (including adjusting bedclothes, sheets and blankets)?: None Difficulty moving from lying on back to sitting on the side of the bed? : None Difficulty sitting down on and standing up from a chair with arms (e.g., wheelchair, bedside commode, etc,.)?: Total Help needed moving to and from a bed to chair (including a wheelchair)?: A Lot Help needed walking in hospital room?: A Lot Help needed climbing 3-5 steps with a railing? : Total 6 Click Score: 14    End of Session Equipment Utilized During Treatment: Gait belt Activity Tolerance: Other (comment) (pt limited by cognitive deficits, restlessness and drop BP) Patient left: in bed;with family/visitor present Nurse Communication: Mobility status PT Visit Diagnosis: Repeated falls (R29.6);Unsteadiness on feet (R26.81);Difficulty in walking,  not elsewhere classified (R26.2)    Time: 6213-0865 PT Time Calculation (min) (ACUTE ONLY): 31 min    16-Jun-2017 1718  PT G-Codes **NOT FOR INPATIENT CLASS**  Functional Assessment Tool Used AM-PAC 6 Clicks Basic Mobility  Functional Limitation Mobility: Walking and moving around  Mobility: Walking and Moving Around Current Status (H8469) CK  Mobility: Walking and Moving Around Goal Status [id number]) CI    Charges:        Wells Guiles B. Mykala Mccready, PT, DPT [phone]    PT Evaluation $PT Eval Moderate Complexity: 1  Procedure PT Treatments $Therapeutic Activity: 8-22 mins   16-Jun-2017, 5:44 PM

## 2017-06-11 ENCOUNTER — Telehealth: Payer: Self-pay | Admitting: Neurology

## 2017-06-11 DIAGNOSIS — G9340 Encephalopathy, unspecified: Secondary | ICD-10-CM | POA: Diagnosis not present

## 2017-06-11 DIAGNOSIS — W19XXXA Unspecified fall, initial encounter: Secondary | ICD-10-CM

## 2017-06-11 DIAGNOSIS — R569 Unspecified convulsions: Secondary | ICD-10-CM

## 2017-06-11 MED ORDER — ALPRAZOLAM 0.5 MG PO TABS: 0.5000 mg | ORAL_TABLET | Freq: Every evening | ORAL | 0 refills | Status: AC | PRN

## 2017-06-11 MED ORDER — PREDNISONE 50 MG PO TABS
60.0000 mg | ORAL_TABLET | Freq: Every day | ORAL | Status: DC
  Administered 2017-06-11 – 2017-06-14 (×4): 60 mg via ORAL
  Filled 2017-06-11 (×4): qty 1

## 2017-06-11 MED ORDER — ACETAMINOPHEN 325 MG PO TABS: 650.0000 mg | ORAL_TABLET | Freq: Four times a day (QID) | ORAL | Status: AC | PRN

## 2017-06-11 NOTE — Progress Notes (Signed)
NURSING PROGRESS NOTE  Wissam Resor [id number] Admission Data: 06/11/2017 6:58 AM Attending Provider: Waldemar Dickens, MD [id number], Yvetta Coder, FNP Code Status: Full   Rodney Day is a 71 y.o. male patient admitted from ED:  -No acute distress noted.  -No complaints of shortness of breath.  -No complaints of chest pain.   Cardiac Monitoring: N/A  Blood pressure 135/70, pulse 73, temperature 97.8 F (36.6 C), temperature source Oral, resp. rate 20, height 6' (1.829 m), weight 79.4 kg (175 lb), SpO2 100 %.   IV Fluids:  IV in place, occlusive dsg intact without redness, IV cath hand left, condition patent and no redness normal saline.   Allergies:  Patient has no known allergies.  Past Medical History:   has a past medical history of Encephalopathy; Hyponatremia; and Malignant neoplasm of bladder ([place]).  Past Surgical History:   has a past surgical history that includes Transurethral resection of bladder tumor; Pr Cystectomy,Ileal Conduit/Sigmoid Bladder (05/13/2013); Pr Remove Pelvis Lymph Nodes (05/13/2013); ERCP (N/A, 04/09/2016); and Electormagnetic navigation bronchoscopy (N/A, 05/01/2017).  Social History:   reports that he has quit smoking. His smoking use included Cigarettes. He has a 12.75 pack-year smoking history. He has never used smokeless tobacco. He reports that he drinks alcohol. He reports that he does not use drugs.  Skin: ST to LFA, abrasion to nose, bruise to L forehead  Patient/Family orientated to room. Information packet given to patient/family. Admission inpatient armband information verified with patient/family to include name and date of birth and placed on patient arm. Side rails up x 2, fall assessment and education completed with patient/family. Patient/family able to verbalize understanding of risk associated with falls and verbalized understanding to call for assistance before getting out of bed. Call light within reach. Patient/family able  to voice and demonstrate understanding of unit orientation instructions.

## 2017-06-11 NOTE — Discharge Summary (Addendum)
[facility]  Physician Discharge Summary   Patient ID: Rodney Day MRN: [id number] DOB/AGE: 70-Aug-1947 70 y.o.  Admit date: 06/10/2017 Discharge date: 06/11/2017  PCP: Burnard Hawthorne, FNP  DISCHARGE DIAGNOSES:  Principal Problem:   Encephalopathy Active Problems:   Bladder cancer ([place])   Hyponatremia   Dysphagia   Fall   Seizures ([place])   RECOMMENDATIONS FOR OUTPATIENT FOLLOW UP: 1. Patient needs to continue to follow with Dr. Jannifer [name], neurologist. 2. Continue prednisone for now as recommended by Dr. Jannifer [name]. 3. Patient will need swallow evaluation at skilled nursing facility 4. Palliative medicine consultation at skilled nursing facility  DISCHARGE CONDITION: fair  Diet recommendation: Dysphagia 3 diet with HTL  Filed Weights   06/10/17 0829  Weight: 79.4 kg (175 lb)    INITIAL HISTORY: 71 y.o. male with medical history significant bladder cancer status post cystectomy, complete with ureteroileal conduit, hypotension, delayed diagnosed rapidly progressive illness presented to emergency Department chief complaint of frequent falls and continued confusion. Family reports inability to continue to provide care.   HOSPITAL COURSE:   Chronic Encephalopathy.  This has been rapidly progressing. Etiology unclear. Urinalysis consistent with urine collected from urostomy bag. Note evaluated by urology to weeks ago who opined not likely patient ever had a urinary tract infection and that his mental status issues are unrelated to "recurrent UTIs ". (see urology note dated 05/20/2017) No other signs of infection and no metabolic derangement. Has been hospitalized and worked up in the recent past. Normal ammonia level, negative for syphilis, B-12 within the limits of normal. According to neurology progress note EEG done last hospitalization showed triphasic waves. LP was spinal fluid revealed elevated protein and oligoclonal banding. Neurology evaluated last week and  opined patient with rapidly progressive dementia. Further blood work done considering entities such as CJD paraneoplastic syndrome, lupus, sarcoidosis. See note dated 06/04/2017 Mini-Mental exam score 19 out of 30. Patient was also recently started on prednisone while waiting for results of these tests. Patient apparently had seizure type activity and so he was started on Keppra as well. Patient will need to continue to follow with Dr. Jannifer [name]. Patient was primarily hospitalized because family was unable to take care of patient. Social worker is on board. Should be able to go to skilled nursing facility today.  Suspected Dysphasia Wife reports a frequent cough. Patient also clear his throat and coughs when drinking liquids. Swallowing evaluation has been ordered, but can be pursued and the skilled nursing facility if not done here today.  Mild Hyponatremia Patient was hydrated.  Based on previous notes Palliative medicine consultation was recommended by the patient's neurologist. This can be pursued at the skilled nursing facility.  Patient is medically stable. Okay for discharge to skilled nursing facility.    PERTINENT LABS:  The results of significant diagnostics from this hospitalization (including imaging, microbiology, ancillary and laboratory) are listed below for reference.    Microbiology: Recent Results (from the past 240 hour(s))  Urine culture     Status: None (Preliminary result)   Collection Time: 06/10/17 11:13 AM  Result Value Ref Range Status   Specimen Description URINE, CATHETERIZED  Final   Special Requests NONE  Final   Culture CULTURE REINCUBATED FOR BETTER GROWTH  Final   Report Status PENDING  Incomplete     Labs: Basic Metabolic Panel:  Recent Labs Lab 06/04/17 2102 06/10/17 0830  NA 128* 133*  K 3.9 3.5  CL 94* 101  CO2 27 22  GLUCOSE 107* 94  BUN 20 22*  CREATININE 1.34* 1.17  CALCIUM 8.9 9.3   Liver Function Tests:  Recent Labs Lab  06/04/17 2102 06/10/17 0830  AST 15 32  ALT 16* 92*  ALKPHOS 67 58  BILITOT 0.5 0.5  PROT 6.1* 5.8*  ALBUMIN 3.5 3.4*   CBC:  Recent Labs Lab 06/04/17 2102 06/10/17 0830  WBC 5.2 8.6  NEUTROABS 2.9  --   HGB 11.8* 13.5  HCT 34.8* 39.3  MCV 89.0 91.2  PLT 247 228    CBG:  Recent Labs Lab 06/04/17 2119 06/10/17 0843  GLUCAP 103* 83     IMAGING STUDIES Dg Chest 2 View  Result Date: 06/04/2017 CLINICAL DATA:  Chronic confusion and acute onset of seizure like activity. Initial encounter. EXAM: CHEST  2 VIEW COMPARISON:  None. FINDINGS: The lungs are well-aerated and clear. There is no evidence of focal opacification, pleural effusion or pneumothorax. The heart is normal in size; the mediastinal contour is within normal limits. No acute osseous abnormalities are seen. IMPRESSION: No acute cardiopulmonary process seen. Electronically Signed   By: Garald Balding M.D.   On: 06/04/2017 21:37   Dg Chest 2 View  Result Date: 05/19/2017 CLINICAL DATA:  Altered mental status EXAM: CHEST  2 VIEW COMPARISON:  May 05, 2017 FINDINGS: Lungs are clear. Heart size and pulmonary vascularity are normal. No adenopathy. No bone lesions. IMPRESSION: No edema or consolidation. Electronically Signed   By: Lowella Grip III M.D.   On: 05/19/2017 15:17   Ct Head Wo Contrast  Result Date: 06/10/2017 CLINICAL DATA:  Status post fall.  Disoriented. EXAM: CT HEAD WITHOUT CONTRAST TECHNIQUE: Contiguous axial images were obtained from the base of the skull through the vertex without intravenous contrast. COMPARISON:  None. FINDINGS: Brain: No evidence of acute infarction, hemorrhage, extra-axial collection, ventriculomegaly, or mass effect. Generalized cerebral atrophy. Periventricular white matter low attenuation likely secondary to microangiopathy. Vascular: Cerebrovascular atherosclerotic calcifications are noted. Skull: Negative for fracture or focal lesion. Sinuses/Orbits: Visualized portions of the  orbits are unremarkable. Visualized portions of the paranasal sinuses and mastoid air cells are unremarkable. Other: None. IMPRESSION: 1. No acute intracranial pathology. 2. Chronic microvascular disease and cerebral atrophy. Electronically Signed   By: Kathreen Devoid   On: 06/10/2017 12:44   Ct Head Wo Contrast  Result Date: 06/04/2017 CLINICAL DATA:  Altered level of consciousness. Confusion for months. EXAM: CT HEAD WITHOUT CONTRAST TECHNIQUE: Contiguous axial images were obtained from the base of the skull through the vertex without intravenous contrast. COMPARISON:  None. FINDINGS: Brain: Mild generalized atrophy, normal for age. No evidence of acute infarction, hemorrhage, hydrocephalus, extra-axial collection or mass lesion/mass effect. Vascular: Atherosclerosis of skullbase vasculature without hyperdense vessel or abnormal calcification. Skull: Normal. Negative for fracture or focal lesion. Sinuses/Orbits: Paranasal sinuses and mastoid air cells are clear. The visualized orbits are unremarkable. Other: None. IMPRESSION: No acute intracranial abnormality or explanation for altered level of consciousness. Electronically Signed   By: Jeb Levering M.D.   On: 06/04/2017 22:03   Ct Renal Stone Study  Result Date: 05/19/2017 CLINICAL DATA:  UTI EXAM: CT ABDOMEN AND PELVIS WITHOUT CONTRAST TECHNIQUE: Multidetector CT imaging of the abdomen and pelvis was performed following the standard protocol without IV contrast. COMPARISON:  04/21/2017, 04/14/2017 FINDINGS: Lower chest: Previously noted cavitary lesion in the left lower lobe is not identified. Linear scarring in the left lower lobe. No pleural effusion or acute consolidation. Coronary artery calcifications. Trace pericardial effusion or thickening. Hepatobiliary: Calcified gallstones. No  biliary dilatation. No focal hepatic abnormality Pancreas: Unremarkable. No pancreatic ductal dilatation or surrounding inflammatory changes. Spleen: Normal in size  without focal abnormality. Adrenals/Urinary Tract: Diffuse thickening of the adrenal glands without discrete mass. Atrophic right kidney. No hydronephrosis. Status post cystectomy with right lower quadrant ileal conduit. Stomach/Bowel: Stomach nonenlarged. No dilated small bowel. Large amount of stool in the colon. No colon wall thickening. Normal appendix. Postsurgical changes of right lower quadrant small bowel. Right lower quadrant ileostomy with parastomal hernia containing fat. Vascular/Lymphatic: Focal ectasia of the distal abdominal aorta measuring 2.8 cm. No significantly enlarged pelvic lymph nodes. Reproductive: Status post prostatectomy Other: No free air or free fluid. Moderate fat in the periumbilical region Musculoskeletal: Degenerative changes.  No acute osseus abnormality. IMPRESSION: 1. Status post cystoprostatectomy with right lower quadrant ileal conduit. Kidneys demonstrate no hydronephrosis or calcified stones. The right kidney is atrophic. 2. Large volume of stool in the colon 3. Calcified gallstones 4. Ectasia of the infrarenal abdominal aorta a distally measuring up to 2.8 cm. Ectatic abdominal aorta at risk for aneurysm development. Recommend followup by ultrasound in 5 years. This recommendation follows ACR consensus guidelines: White Paper of the ACR Incidental Findings Committee II on Vascular Findings. J Am Coll Radiol 2013; 10:789-794. 5. Previously noted left lower lobe cavitary lesion no longer visualized 6. Periumbilical and parastomal fat containing hernias. Electronically Signed   By: Donavan Foil M.D.   On: 05/19/2017 17:49    DISCHARGE EXAMINATION: Vitals:   06/10/17 2058 06/10/17 2059 06/10/17 2140 06/11/17 0620  BP: 116/60  118/74 135/70  Pulse:   75 73  Resp: 19 17 18 20   Temp:   98.4 F (36.9 C) 97.8 F (36.6 C)  TempSrc:   Oral Oral  SpO2:   93% 100%  Weight:      Height:       General appearance: alert, distracted and no distress Resp: clear to  auscultation bilaterally Cardio: regular rate and rhythm, S1, S2 normal, no murmur, click, rub or gallop GI: soft, non-tender; bowel sounds normal; no masses,  no organomegaly and Urostomy is present  DISPOSITION: SNF  Discharge Instructions    Call MD for:  extreme fatigue    Complete by:  As directed    Call MD for:  persistant dizziness or light-headedness    Complete by:  As directed    Call MD for:  persistant nausea and vomiting    Complete by:  As directed    Call MD for:  severe uncontrolled pain    Complete by:  As directed    Call MD for:  temperature >100.4    Complete by:  As directed    Discharge instructions    Complete by:  As directed    Please see instructions on the discharge summary.  You were cared for by a hospitalist during your hospital stay. If you have any questions about your discharge medications or the care you received while you were in the hospital after you are discharged, you can call the unit and asked to speak with the hospitalist on call if the hospitalist that took care of you is not available. Once you are discharged, your primary care physician will handle any further medical issues. Please note that NO REFILLS for any discharge medications will be authorized once you are discharged, as it is imperative that you return to your primary care physician (or establish a relationship with a primary care physician if you do not have one) for your  aftercare needs so that they can reassess your need for medications and monitor your lab values. If you do not have a primary care physician, you can call [phone] for a physician referral.   Increase activity slowly    Complete by:  As directed       ALLERGIES: No Known Allergies    Current Discharge Medication List    START taking these medications   Details  acetaminophen (TYLENOL) 325 MG tablet Take 2 tablets (650 mg total) by mouth every 6 (six) hours as needed for mild pain (or Fever >/= 101).        CONTINUE these medications which have CHANGED   Details  ALPRAZolam (XANAX) 0.5 MG tablet Take 1 tablet (0.5 mg total) by mouth at bedtime as needed for anxiety. Qty: 15 tablet, Refills: 0      CONTINUE these medications which have NOT CHANGED   Details  feeding supplement (BOOST HIGH PROTEIN) LIQD Take 1 Container by mouth 3 (three) times daily between meals.    Glycopyrrolate-Formoterol (BEVESPI AEROSPHERE) 9-4.8 MCG/ACT AERO Inhale 2 puffs into the lungs 2 (two) times daily. Qty: 10.7 g, Refills: 5    levETIRAcetam (KEPPRA) 250 MG tablet Take 1 tablet (250 mg total) by mouth 2 (two) times daily. Qty: 60 tablet, Refills: 3    Multiple Vitamin (MULTIVITAMIN WITH MINERALS) TABS tablet Take 1 tablet by mouth daily.    predniSONE (DELTASONE) 20 MG tablet Take 3 tablets (60 mg total) by mouth daily. Qty: 90 tablet, Refills: 3    albuterol (PROVENTIL HFA;VENTOLIN HFA) 108 (90 Base) MCG/ACT inhaler Inhale 2 puffs into the lungs every 4 (four) hours as needed for wheezing or shortness of breath. Qty: 1 Inhaler, Refills: 6    ibuprofen (ADVIL,MOTRIN) 200 MG tablet Take 400 mg by mouth every 8 (eight) hours as needed for mild pain.    Associated Diagnoses: Malignant neoplasm of urinary bladder, unspecified site [facility])         Follow-up Information    Kathrynn Ducking, MD. Schedule an appointment as soon as possible for a visit in 2 week(s).   Specialty:  Neurology Contact information: [address] [place] [address] [place] TIME: 75 mins  [facility] Pager [phone]  06/11/2017, 1:00 PM

## 2017-06-11 NOTE — Progress Notes (Signed)
CM provided pt's wife with Private Duty list as requested. Whitman Hero RN,BSN,CM

## 2017-06-11 NOTE — Progress Notes (Signed)
OT Cancellation Note  Patient Details Name: Rodney Day MRN: [id number] DOB: 08-28-1946   Cancelled Treatment:    Reason Eval/Treat Not Completed: Fatigue/lethargy limiting ability to participate. Pt sleeping soundly, family asked that pt not be disturbed. Will follow.  Malka So 06/11/2017, 10:52 AM  [phone]

## 2017-06-11 NOTE — Telephone Encounter (Signed)
I spoke with Dr. Bonnielee Haff, re: patient and prednisone trial. So far patient not having much benefit from prednision in the last 1 week. Now patient being discharged to SNF. I suggested to continue predinsone 60mg  daily for at least 2-4 weeks for now, and Dr. Jannifer [name] can address timing of tapering vs continuation of prednisone.  Penni Bombard, MD 04/30/16, 4:94 PM Certified in Neurology, Neurophysiology and Neuroimaging  [facility] [address], [place], [place] [address] [phone]

## 2017-06-11 NOTE — Telephone Encounter (Signed)
Forwarding to the work-in doctor this afternoon thanks

## 2017-06-11 NOTE — Telephone Encounter (Signed)
Dr Maryland Pink from [facility] is wanting to speak with you re: medication for pt, he is asking to be called at [phone]

## 2017-06-11 NOTE — Progress Notes (Signed)
Patient currently does not have any SNF offers due to patient trying to constantly get out of the bed and being a fall risk. SNFs do not have restraints, bed alarms or sitters at their facility. CSW explained this to family. Family reports concern about patient's safety if he is not constantly watched. CSW explained that a private duty sitter would be an out of pocket cost. Patient would be more appropriate for a memory care unit, but family is unable to afford long term care placement. Patient's family does not want patient to discharge today. RNCM alerted. CSW continuing to search for an accepting snf.   Rodney Day LCSWA [phone]

## 2017-06-11 NOTE — Evaluation (Signed)
Clinical/Bedside Swallow Evaluation Patient Details  Name: Rodney Day MRN: [id number] Date of Birth: 07-20-1946  Today's Date: 06/11/2017 Time: SLP Start Time (ACUTE ONLY): 5427 SLP Stop Time (ACUTE ONLY): 1435 SLP Time Calculation (min) (ACUTE ONLY): 23 min  Past Medical History:  Past Medical History:  Diagnosis Date  . Encephalopathy   . Hyponatremia   . Malignant neoplasm of bladder [facility])    Past Surgical History:  Past Surgical History:  Procedure Laterality Date  . ELECTROMAGNETIC NAVIGATION BROCHOSCOPY N/A 05/01/2017   Procedure: ELECTROMAGNETIC NAVIGATION BRONCHOSCOPY;  Surgeon: Flora Lipps, MD;  Location: [facility] ORS;  Service: Cardiopulmonary;  Laterality: N/A;  . ERCP N/A 04/09/2016   Procedure: ENDOSCOPIC RETROGRADE CHOLANGIOPANCREATOGRAPHY (ERCP);  Surgeon: Hulen Luster, MD;  Location: [facility] ENDOSCOPY;  Service: Gastroenterology;  Laterality: N/A;  . Pr Cystectomy,Ileal Conduit/Sigmoid Bladder  05/13/2013   CYSTECTOMY, COMPLETE, WITH URETEROILEAL CONDUIT OR SIGMOID BLADDER, INCLUDING INTESTINE ANASTOMOSIS; Surgeon: Ronnald Nian, MD  . Pr Remove Pelvis Lymph Nodes  05/13/2013   Procedure: PELVIC LYMPHADENECTOMY W/EXT ILIAC (SEPART PROC); Surgeon: Ronnald Nian, MD  . TRANSURETHRAL RESECTION OF BLADDER TUMOR     HPI:  71 y.o. male came to the emergency room on 06/10/17 due to fall off of the commode at home. CT negative. Pt has fallen many times at home and had a recent cognitive decline for which he is being followed by Dr. Jannifer [name] (neurologist). Pt with significant PMH of bladder CA, resection of bladder tumor, ERCP, Cystectomy. Per chart, wife reports patient has frequent coughing when drinking thin liquids. CXR 6/21 no acute cardiopulmonary process seen. Diagnosised with chronic encephalopathy of unknown cause.    Assessment / Plan / Recommendation Clinical Impression  Pt able to maintain adequate alertness; confused requiring verbal and tactile cues during  assessment. Current clinical swallow function included multiple swallows indicative of possible residuals, immediate and delayed cough. Suspected delayed swallow initiation and esophageal component due to frequent eructation. Honey thick liquid appeared to mitigate aspiration risk. Recommend Dys 3 and honey thick liquids until planned MBS tomorrow.  SLP Visit Diagnosis: Dysphagia, unspecified (R13.10)    Aspiration Risk  Moderate aspiration risk    Diet Recommendation Dysphagia 3 (Mech soft);Honey-thick liquid   Liquid Administration via: Cup Medication Administration: Crushed with puree Supervision: Patient able to self feed;Full supervision/cueing for compensatory strategies Compensations: Slow rate;Small sips/bites;Minimize environmental distractions Postural Changes: Seated upright at 90 degrees    Other  Recommendations Oral Care Recommendations: Oral care BID   Follow up Recommendations Skilled Nursing facility      Frequency and Duration min 2x/week  2 weeks       Prognosis Prognosis for Safe Diet Advancement:  (fair-good) Barriers to Reach Goals: Cognitive deficits;Severity of deficits      Swallow Study   General HPI: 71 y.o. male came to the emergency room on 06/10/17 due to fall off of the commode at home. CT negative. Pt has fallen many times at home and had a recent cognitive decline for which he is being followed by Dr. Jannifer [name] (neurologist). Pt with significant PMH of bladder CA, resection of bladder tumor, ERCP, Cystectomy. Per chart, wife reports patient has frequent coughing when drinking thin liquids. CXR 6/21 no acute cardiopulmonary process seen. Diagnosised with chronic encephalopathy of unknown cause.  Type of Study: Bedside Swallow Evaluation Previous Swallow Assessment:  (none found) Diet Prior to this Study: Thin liquids;Dysphagia 3 (soft) Temperature Spikes Noted: No Respiratory Status: Room air History of Recent Intubation: No Behavior/Cognition:  Lethargic/Drowsy;Cooperative;Distractible;Requires  cueing Oral Cavity Assessment: Within Functional Limits Oral Care Completed by SLP: No Oral Cavity - Dentition: Adequate natural dentition Vision: Functional for self-feeding Self-Feeding Abilities: Able to feed self;Needs assist;Needs set up Patient Positioning: Upright in bed Baseline Vocal Quality: Low vocal intensity Volitional Cough: Strong Volitional Swallow: Able to elicit    Oral/Motor/Sensory Function Overall Oral Motor/Sensory Function: Generalized oral weakness   Ice Chips Ice chips: Not tested   Thin Liquid Thin Liquid: Impaired Presentation: Cup;Straw Pharyngeal  Phase Impairments: Cough - Delayed;Suspected delayed Swallow;Multiple swallows;Decreased hyoid-laryngeal movement;Cough - Immediate    Nectar Thick Nectar Thick Liquid: Not tested   Honey Thick Honey Thick Liquid: Impaired Presentation: Cup Pharyngeal Phase Impairments: Multiple swallows;Suspected delayed Swallow   Puree Puree: Impaired Pharyngeal Phase Impairments: Multiple swallows   Solid   GO   Solid: Not tested    Functional Assessment Tool Used: skilled clinical judgement Functional Limitations: Swallowing Swallow Current Status (N1833): At least 40 percent but less than 60 percent impaired, limited or restricted Swallow Goal Status [phone]): At least 20 percent but less than 40 percent impaired, limited or restricted   Houston Siren 06/11/2017,3:17 PM  Orbie Pyo Colvin Carol[place].Ed [employer] [phone]

## 2017-06-11 NOTE — Progress Notes (Signed)
pts wife requested that she would feel safer if RN and NT put all 4 side rails up on pts bed. Rn put all 4 side rails up at this time

## 2017-06-12 ENCOUNTER — Observation Stay (HOSPITAL_COMMUNITY): Payer: Medicare Other

## 2017-06-12 DIAGNOSIS — F0390 Unspecified dementia without behavioral disturbance: Secondary | ICD-10-CM | POA: Diagnosis present

## 2017-06-12 DIAGNOSIS — N329 Bladder disorder, unspecified: Secondary | ICD-10-CM | POA: Diagnosis not present

## 2017-06-12 DIAGNOSIS — G4089 Other seizures: Secondary | ICD-10-CM | POA: Diagnosis not present

## 2017-06-12 DIAGNOSIS — Z79899 Other long term (current) drug therapy: Secondary | ICD-10-CM | POA: Diagnosis not present

## 2017-06-12 DIAGNOSIS — W19XXXD Unspecified fall, subsequent encounter: Secondary | ICD-10-CM

## 2017-06-12 DIAGNOSIS — G9340 Encephalopathy, unspecified: Secondary | ICD-10-CM | POA: Diagnosis not present

## 2017-06-12 DIAGNOSIS — Z66 Do not resuscitate: Secondary | ICD-10-CM | POA: Diagnosis present

## 2017-06-12 DIAGNOSIS — C801 Malignant (primary) neoplasm, unspecified: Secondary | ICD-10-CM | POA: Diagnosis not present

## 2017-06-12 DIAGNOSIS — R4182 Altered mental status, unspecified: Secondary | ICD-10-CM | POA: Diagnosis not present

## 2017-06-12 DIAGNOSIS — S0083XA Contusion of other part of head, initial encounter: Secondary | ICD-10-CM | POA: Diagnosis not present

## 2017-06-12 DIAGNOSIS — G9349 Other encephalopathy: Secondary | ICD-10-CM | POA: Diagnosis not present

## 2017-06-12 DIAGNOSIS — G3183 Dementia with Lewy bodies: Secondary | ICD-10-CM | POA: Diagnosis not present

## 2017-06-12 DIAGNOSIS — D1432 Benign neoplasm of left bronchus and lung: Secondary | ICD-10-CM | POA: Diagnosis not present

## 2017-06-12 DIAGNOSIS — F0280 Dementia in other diseases classified elsewhere without behavioral disturbance: Secondary | ICD-10-CM

## 2017-06-12 DIAGNOSIS — Z8551 Personal history of malignant neoplasm of bladder: Secondary | ICD-10-CM | POA: Diagnosis not present

## 2017-06-12 DIAGNOSIS — R569 Unspecified convulsions: Secondary | ICD-10-CM | POA: Diagnosis present

## 2017-06-12 DIAGNOSIS — C679 Malignant neoplasm of bladder, unspecified: Secondary | ICD-10-CM | POA: Diagnosis not present

## 2017-06-12 DIAGNOSIS — Z515 Encounter for palliative care: Secondary | ICD-10-CM

## 2017-06-12 DIAGNOSIS — R1310 Dysphagia, unspecified: Secondary | ICD-10-CM | POA: Diagnosis not present

## 2017-06-12 DIAGNOSIS — E871 Hypo-osmolality and hyponatremia: Secondary | ICD-10-CM | POA: Diagnosis present

## 2017-06-12 MED ORDER — ENSURE ENLIVE PO LIQD
237.0000 mL | Freq: Two times a day (BID) | ORAL | Status: DC
  Administered 2017-06-13: 237 mL via ORAL

## 2017-06-12 MED ORDER — SODIUM CHLORIDE 0.9 % IV SOLN
INTRAVENOUS | Status: DC
  Administered 2017-06-12 – 2017-06-14 (×3): via INTRAVENOUS

## 2017-06-12 NOTE — Progress Notes (Signed)
Modified Barium Swallow Progress Note  Patient Details  Name: Rodney Day MRN: [id number] Date of Birth: 1946/01/11  Today's Date: 06/12/2017  Modified Barium Swallow completed.  Full report located under Chart Review in the Imaging Section.  Brief recommendations include the following:  Clinical Impression  Pt exhibited a severe sensorimotor pharyngeal phase dysphagia marked by significantly decreased hyolaryngeal anterior forward movement, decreased laryngeal elevation and reduced epiglottic deflection resulting in frank aspiration with thin barium with delayed minimal throat clear and considered mostly silent. Maximum vallecular and pyriform sinsus residue post swallow with nectar. Pt unable to initiate a second cued swallow with verbal stimuli, dry spoon with lingual pressure. After two minutes minutes, SLP placed [place] posterior tongue and extracted barium from valleculae (little to no gag reflex). Presently he is not safe with any food/liquid and difficulty determining prognosis without clear etiololgy of impairments. Discussed with MD who will explain options to family and consult Palliative care. Will follow up for decision re: nutrition.      Swallow Evaluation Recommendations       SLP Diet Recommendations: NPO       Medication Administration: Via alternative means               Oral Care Recommendations: Oral care QID        Houston Siren 06/12/2017,4:31 PM   Orbie Pyo [place] [phone]

## 2017-06-12 NOTE — Progress Notes (Signed)
06/12/17 1300  OT Visit Information  Last OT Received On 06/12/17  Assistance Needed +2 (for safety during gait)  History of Present Illness 71 y.o. male came to the emergency room on 06/10/17 due to fall off of the commode at home.  CT negative. Pt has fallen many times at home and had a recent cognitive decline for which he is being followed by Dr. Jannifer [name] (neurologist).  Pt with significant PMH of bladder CA, resection of bladder tumor, ERCP, Cystectomy (bag on right lower quadrant).  Precautions  Precautions Fall  Precaution Comments multiple falls every week  Pain Assessment  Pain Assessment Faces  Faces Pain Scale 0  Cognition  Arousal/Alertness Awake/alert;Lethargic (immediately closes eyes when not stimulated)  Behavior During Therapy Impulsive;Restless  Overall Cognitive Status Impaired/Different from baseline  Area of Impairment Orientation;Attention;Memory;Following commands;Safety/judgement;Awareness;Problem solving  Current Attention Level Focused  Safety/Judgement Decreased awareness of safety;Decreased awareness of deficits  Problem Solving Difficulty sequencing;Requires verbal cues;Requires tactile cues  General Comments Pt stating he needed to urinate, but has a urostomy.  ADL  Overall ADL's  Needs assistance/impaired  Grooming Wash/dry hands;Sitting;Moderate assistance  Grooming Details (indicate cue type and reason) assist for thoroughness, completed at EOB  Lower Body Bathing Maximal assistance;Sitting/lateral leans  Lower Body Bathing Details (indicate cue type and reason) to change soiled socks  General ADL Comments Pt stating he needed to urinate, assisted to empty urostomy into urinal.  Vision- Assessment  Additional Comments narrow visual field  Bed Mobility  Overal bed mobility Needs Assistance  Bed Mobility Supine to Sit;Sit to Supine  Supine to sit Supervision  Sit to supine Supervision  General bed mobility comments pt seated EOB upon OTs arrival,  returned to supine with supervision.  Transfers  Overall transfer level Needs assistance  Equipment used 1 person hand held assist  Transfers Sit to/from Stand  Sit to Stand Min assist  General transfer comment pt impulsive and high fall risk   Balance  Overall balance assessment Needs assistance  Sitting balance-Leahy Scale Fair  Standing balance-Leahy Scale Poor  OT - End of Session  Activity Tolerance Patient tolerated treatment well  Patient left in bed;with call bell/phone within reach;with bed alarm set (4 rails up)  OT Assessment  OT Recommendation/Assessment Patient needs continued OT Services  OT Visit Diagnosis Unsteadiness on feet (R26.81)  OT Problem List Decreased strength;Decreased activity tolerance;Impaired balance (sitting and/or standing);Impaired vision/perception;Decreased safety awareness;Decreased knowledge of use of DME or AE;Decreased knowledge of precautions  Barriers to Discharge Decreased caregiver support  OT Plan  OT Frequency (ACUTE ONLY) Min 2X/week  OT Treatment/Interventions (ACUTE ONLY) Self-care/ADL training;Therapeutic exercise;DME and/or AE instruction;Therapeutic activities;Cognitive remediation/compensation;Visual/perceptual remediation/compensation;Patient/family education;Balance training  AM-PAC OT "6 Clicks" Daily Activity Outcome Measure  Help from another person eating meals? 3  Help from another person taking care of personal grooming? 2  Help from another person toileting, which includes using toliet, bedpan, or urinal? 2  Help from another person bathing (including washing, rinsing, drying)? 2  Help from another person to put on and taking off regular upper body clothing? 2  Help from another person to put on and taking off regular lower body clothing? 2  6 Click Score 13  ADL G Code Conversion CL  OT Recommendation  Follow Up Recommendations SNF  OT Equipment None recommended by OT  Individuals Consulted  Consulted and Agree with  Results and Recommendations Patient unable/family or caregiver not available  Acute Rehab OT Goals  Time For Goal Achievement 06/26/17  Potential  to Achieve Goals Good  OT Time Calculation  OT Start Time (ACUTE ONLY) 1136  OT Stop Time (ACUTE ONLY) 1152  OT Time Calculation (min) 16 min  OT General Charges  $OT Visit 1 Procedure  OT Treatments  $Self Care/Home Management  8-22 mins  06/12/2017 Nestor Lewandowsky, OTR/L Pager: [phone]

## 2017-06-12 NOTE — Progress Notes (Signed)
Nutrition Brief Note  Chart reviewed. Pt now transitioning to comfort care.  No further nutrition interventions warranted at this time.  Please re-consult as needed.   Jamontae Thwaites A. [place], RD, LDN, CDE Pager: [phone] After hours Pager: [phone]  

## 2017-06-12 NOTE — Progress Notes (Signed)
  Speech Language Pathology   Patient Details Name: Rodney Day MRN: [id number] DOB: 30-Jun-1946 Today's Date: 06/12/2017 Time: 9784-7841 SLP Time Calculation (min) (ACUTE ONLY): 15 min    MBS completed. Full report to be charted. Severe pharyngeal dysphagia, aspiration (silent except for slight throat clear) of nectar and thin liquids, MAX pharyngeal residue- pt unable to initiate a second volitional swallow with SLP using various techniques. SLP used Rodney Day to suction from his valleculae. Pt appears to eat/swallow much better than appearance on MBS- due to silent nature. Dr. Maryland Pink, G called who stated he would speak with family re: possible Palliative/their wishes.   Rodney Day [employer] [phone]

## 2017-06-12 NOTE — Progress Notes (Addendum)
[facility] PROGRESS NOTE  Rodney Day ZOX:[id number] DOB: [birth year] DOA: 06/10/2017  PCP: Burnard Hawthorne, FNP  Brief History/Interval Summary: 71 y.o.malewith medical history significant bladder cancer status post cystectomy, complete with ureteroileal conduit, hypotension, delayed diagnosed rapidly progressive illness presented to emergency Department chief complaint of frequent falls and continued confusion. Family reports inability to continue to provide care. Plan was initially for placement to a skilled nursing facility. However, patient found to have significant dysphagia with extremely high aspiration risk.  Reason for Visit: Dysphagia  Consultants: Palliative medicine  Procedures: None  Antibiotics: None  Subjective/Interval History: Patient pleasantly confused. Not able to provide much information.  ROS: Unable to do in this individual  Objective:  Vital Signs  Vitals:   06/11/17 0620 06/11/17 1308 06/11/17 2137 06/12/17 0540  BP: 135/70 113/61 109/69 105/66  Pulse: 73 80 85 63  Resp: 20 18 17 18   Temp: 97.8 F (36.6 C) 98.7 F (37.1 C) 99 F (37.2 C)   TempSrc: Oral Oral    SpO2: 100% 98% 100% 100%  Weight:      Height:        Intake/Output Summary (Last 24 hours) at 06/12/17 1249 Last data filed at 06/12/17 0526  Gross per 24 hour  Intake                0 ml  Output              751 ml  Net             -751 ml   Filed Weights   06/10/17 0829  Weight: 79.4 kg (175 lb)    General appearance: alert, appears stated age, distracted and no distress Resp: clear to auscultation bilaterally Cardio: regular rate and rhythm, S1, S2 normal, no murmur, click, rub or gallop GI: soft, non-tender; bowel sounds normal; no masses,  no organomegaly Extremities: extremities normal, atraumatic, no cyanosis or edema Neurologic: Awake, alert. Distracted, disoriented. No obvious focal neurological deficits per  Lab Results:  Data Reviewed: I  have personally reviewed following labs and imaging studies  CBC:  Recent Labs Lab 06/10/17 0830  WBC 8.6  HGB 13.5  HCT 39.3  MCV 91.2  PLT 811    Basic Metabolic Panel:  Recent Labs Lab 06/10/17 0830  NA 133*  K 3.5  CL 101  CO2 22  GLUCOSE 94  BUN 22*  CREATININE 1.17  CALCIUM 9.3    GFR: Estimated Creatinine Clearance: 64.5 mL/min (by C-G formula based on SCr of 1.17 mg/dL).  Liver Function Tests:  Recent Labs Lab 06/10/17 0830  AST 32  ALT 92*  ALKPHOS 58  BILITOT 0.5  PROT 5.8*  ALBUMIN 3.4*    CBG:  Recent Labs Lab 06/10/17 0843  GLUCAP 83     Recent Results (from the past 240 hour(s))  Urine culture     Status: None (Preliminary result)   Collection Time: 06/10/17 11:13 AM  Result Value Ref Range Status   Specimen Description URINE, CATHETERIZED  Final   Special Requests NONE  Final   Culture CULTURE REINCUBATED FOR BETTER GROWTH  Final   Report Status PENDING  Incomplete      Radiology Studies: No results found.   Medications:  Scheduled: . enoxaparin (LOVENOX) injection  40 mg Subcutaneous Q24H  . feeding supplement  1 Container Oral TID BM  . levETIRAcetam  250 mg Oral BID  . predniSONE  60 mg Oral Q breakfast   Continuous:  IFO:YDXAJOINOMVEH **OR** acetaminophen, albuterol, ALPRAZolam, haloperidol lactate, hydrOXYzine, ondansetron **OR** ondansetron (ZOFRAN) IV, traZODone  Assessment/Plan:  Principal Problem:   Encephalopathy Active Problems:   Bladder cancer (HCC)   Hyponatremia   Dysphagia   Fall   Seizures (HCC)    Chronic Encephalopathy.  This has been rapidly progressing. Etiology unclear. Urinalysis consistent with urine collected from urostomy bag. Seen recently by urology who does not think that the patient has active UTI. This is unlikely to be contributing to his mental status changes. As noted previously has had extensive workup for his progressive encephalopathy. This is thought to be rapidly  progressive dementia. Followed by neurology as an outpatient. Normal ammonia level, negative for syphilis, B-12 within the limits of normal. According to neurology progress note EEG done last hospitalization showed triphasic waves. LP was spinal fluid revealed elevated protein and oligoclonal banding. Neurology evaluated last week and opined patient with rapidly progressive dementia. Further blood work done considering entities such as CJD paraneoplastic syndrome, lupus, sarcoidosis. See note dated 06/04/2017 Mini-Mental exam score 19 out of 30. Patient was also recently started on prednisone while waiting for results of these tests. This is being continued. Patient apparently had seizure type activity and so he was started on Keppra as well. Patient will need to continue to follow with Dr. Jannifer [name]. Patient was primarily hospitalized because family was unable to take care of patient. Patient seen bytherapy because the patient's wife mentioned coughing episodes with eating and drinking. He underwent modified barium swallow and this is that the patient has significant dysphagia with extremely high aspiration risk. Previously patient's neurologist has offered hospice and palliative medicine input. Family was not certain at that point in time. Family is amenable to talk to palliative medicine now. They have been consulted to initiate goals of care conversation.  Suspected Dysphasia Significant dysphagia noted on modified BM swallow. Mainly silent aspiration. Speech therapy recommends keeping him nothing by mouth. This has been discussed with patient's wife and son. They are completely taken aback. They have been quite surprised with the rapid decline in the patient's status. Patient likely not a good candidate for tube feedings. Palliative medicine has been consulted. Will give IV fluids for now.  Mild Hyponatremia Patient was hydrated.  DVT Prophylaxis: Lovenox    Code Status: DO NOT RESUSCITATE  Family  Communication: Discussed with the patient's wife and son  Disposition Plan: Management as outlined above.    LOS: 0 days   [facility] Pager [phone] 06/12/2017, 12:49 PM  If 7PM-7AM, please contact night-coverage at [email or web address], password [facility]

## 2017-06-12 NOTE — Progress Notes (Signed)
CSW received referral for residential hospice placement. CSW sent referral to family's first choice, [facility].  Percell Locus Brigida Scotti LCSWA [phone]

## 2017-06-12 NOTE — Consult Note (Signed)
Consultation Note Date: 06/12/2017   Patient Name: Rodney Day  DOB: 1946-05-24  MRN: [id number]  Age / Sex: 71 y.o., male  PCP: Burnard Hawthorne, FNP Referring Physician: Bonnielee Haff, MD  Reason for Consultation: Establishing goals of care, Hospice Evaluation, Inpatient hospice referral and Psychosocial/spiritual support  HPI/Patient Profile: 71 y.o. 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 Patient has had multiple hospitalizations over the past 6 months consisting of altered mental status, falls, UTI.Marland Kitchen   Clinical Assessment and Goals of Care: Met with patient's wife, Meredith Mody, as well as patient for goals of care discussion. Patient is oriented only to himself and was unable to participate in a meaningful way to goals of care. Spoke to his wife  at length; pt has had a sharp functional decline beginning in March 2018 which began by sleeping more, not eating, weight loss. She thought initially that his cancer was back and pursued oncological workup including a lung biopsy which was indeterminate. Patient has lost 30 pounds. Beginning in May, he was unable to walk became more confused and was again admitted to the hospital for altered mental status. He went home with home health with PT coming to the home. Patient was unable to utilize a walker at this point without falling backwards or to the side. At this point he cannot hold a seated position unassisted in the bed. He is only eating bites and sips. Patient underwent a modified barium swallow during this hospitalization and showed severe aspiration risk. She also shares with me beginning approximately 2 weeks ago he began to have nearing death awareness, where he was seeing deceased family members and interacting with them He does  verbalize that he is anxious and worried, his concentration poor; he denies pain  Patient is confused and unable to speak on his own behalf at this point. His wife Meredith Mody, is his Air traffic controller. She  states she and her husband have discussed advance care planning in the past and the following recommendations are based on patient's wishes per these conversations    SUMMARY OF RECOMMENDATIONS   DNR/DNI No PEG tube Eat for comfort Residential hospice for end-of-life care Most form completed and on patient's chart Consult placed to social work for residential hospice Code Status/Advance Care Planning:  DNR    Symptom Management:   Anxiety: Low-dose, short acting benzodiazepines may be appropriate such as Ativan 0.5 mg every 8 hours as needed; Haldol 1-2 mg every 8 hours as needed  Shortness of breath/pain: Low-dose morphine concentrate may benefit patient as disease progresses. He is not short of breath at rest but verbalizing shortness of breath with movement. Dosing of morphine 2.5-5 mg every 4 hours as needed for pain or shortness of breath (patient is currently denying pain)  Palliative Prophylaxis:   Aspiration, Bowel Regimen, Delirium Protocol, Eye Care, Frequent Pain Assessment, Oral Care and Turn Reposition  Additional Recommendations (Limitations, Scope, Preferences):  Full Comfort Care  Psycho-social/Spiritual:   Desire for further Chaplaincy support:no  Additional Recommendations: Grief/Bereavement Support  Prognosis:   < 2 weeks in the setting of moderate to severe dementia, bladder cancer, sharp functional decline beginning in March as evidenced by 30 pound weight lost, increased sleep, eating only bites and sips; now unable to walk or maintain a seated position without assist, multiple falls, as well as nearing death awareness seeing deceased family members  Discharge Planning: Hospice facility      Primary Diagnoses: Present on Admission: . Bladder  cancer ([place]) . Encephalopathy . Hyponatremia   I have reviewed the medical record, interviewed the patient and family, and examined the patient. The following aspects are pertinent.  Past Medical History:  Diagnosis Date  . Encephalopathy   . Hyponatremia   . Malignant neoplasm of bladder [facility])    Social History   Social History  . Marital status: Married    Spouse name: N/A  . Number of children: 4  . Years of education: 9th   Occupational History  . Retired    Social History Main Topics  . Smoking status: Former Smoker    Packs/day: 0.25    Years: 51.00    Types: Cigarettes  . Smokeless tobacco: Never Used     Comment: 06/04/17 - not smoked in two months  . Alcohol use 0.0 oz/week     Comment: 1 beer every 2 weeks  . Drug use: No  . Sexual activity: Not Asked   Other Topics Concern  . None   Social History Narrative   Right-handed.   2 cups of caffeine per day.   Lives at home with his wife.   Family History  Problem Relation Age of Onset  . Bladder Cancer Father   . Prostate cancer Father   . COPD Father   . Stroke Paternal Uncle   . Dementia Mother   . Diabetes Mother    Scheduled Meds: . enoxaparin (LOVENOX) injection  40 mg Subcutaneous Q24H  . feeding supplement  1 Container Oral TID BM  . levETIRAcetam  250 mg Oral BID  . predniSONE  60 mg Oral Q breakfast   Continuous Infusions: . sodium chloride     PRN Meds:.acetaminophen **OR** acetaminophen, albuterol, ALPRAZolam, haloperidol lactate, hydrOXYzine, ondansetron **OR** ondansetron (ZOFRAN) IV, traZODone Medications Prior to Admission:  Prior to Admission medications   Medication Sig Start Date End Date Taking? Authorizing Provider  feeding supplement (BOOST HIGH PROTEIN) LIQD Take 1 Container by mouth 3 (three) times daily between meals.   Yes [provider]  Glycopyrrolate-Formoterol (BEVESPI AEROSPHERE) 9-4.8 MCG/ACT AERO Inhale 2 puffs into the lungs 2 (two) times daily. 04/28/17   Yes Flora Lipps, MD  levETIRAcetam (KEPPRA) 250 MG tablet Take 1 tablet (250 mg total) by mouth 2 (two) times daily. 06/05/17  Yes Kathrynn Ducking, MD  Multiple Vitamin (MULTIVITAMIN WITH MINERALS) TABS tablet Take 1 tablet by mouth daily.   Yes [provider]  predniSONE (DELTASONE) 20 MG tablet Take 3 tablets (60 mg total) by mouth daily. 06/05/17  Yes Kathrynn Ducking, MD  acetaminophen (TYLENOL) 325 MG tablet Take 2 tablets (650 mg total) by mouth every 6 (six) hours as needed for mild pain (or Fever >/= 101). 06/11/17   Bonnielee Haff, MD  albuterol (PROVENTIL HFA;VENTOLIN HFA) 108 (90 Base) MCG/ACT inhaler Inhale 2 puffs into the lungs every 4 (four) hours as needed for wheezing or shortness of breath. 04/28/17   Flora Lipps, MD  ALPRAZolam (XANAX) 0.5 MG tablet Take 1 tablet (0.5 mg total) by mouth at bedtime as needed for anxiety. 06/11/17   Bonnielee Haff, MD  ibuprofen (ADVIL,MOTRIN) 200 MG tablet Take 400 mg by mouth every 8 (eight) hours as needed for mild pain.     [provider]   No Known Allergies Review of Systems  Unable to perform ROS: Dementia    Physical Exam  Constitutional: He appears well-developed and well-nourished.  Neck: Normal range of motion.  Cardiovascular: Normal rate and regular rhythm.   Musculoskeletal: Normal range of motion.  Extensive lower extremity weakness; unable to maintain a seated position on his own  Neurological: He is alert.  Oriented to self  Skin: Skin is warm and dry.  Psychiatric:  Patient is restless, confused oriented only to himself  Nursing note and vitals reviewed.   Vital Signs: BP 105/66 (BP Location: Left Arm)   Pulse 63   Temp 99 F (37.2 C)   Resp 18   Ht 6' (1.829 m)   Wt 79.4 kg (175 lb)   SpO2 100%   BMI 23.73 kg/m  Pain Assessment: No/denies pain   Pain Score: 0-No pain   SpO2: SpO2: 100 % O2 Device:SpO2: 100 % O2 Flow Rate: .   IO: Intake/output summary:  Intake/Output Summary  (Last 24 hours) at 06/12/17 1329 Last data filed at 06/12/17 0526  Gross per 24 hour  Intake                0 ml  Output              751 ml  Net             -751 ml    LBM: Last BM Date: 06/11/17 Baseline Weight: Weight: 79.4 kg (175 lb) Most recent weight: Weight: 79.4 kg (175 lb)     Palliative Assessment/Data:   Flowsheet Rows     Most Recent Value  Intake Tab  Referral Department  Hospitalist  Unit at Time of Referral  Med/Surg Unit  Palliative Care Primary Diagnosis  Neurology  Date Notified  06/12/17  Palliative Care Type  New Palliative care  Reason for referral  Clarify Goals of Care, Counsel Regarding Hospice, Psychosocial or Spiritual support  Date of Admission  06/10/17  Date first seen by Palliative Care  06/12/17  # of days Palliative referral response time  0 Day(s)  # of days IP prior to Palliative referral  2  Clinical Assessment  Palliative Performance Scale Score  30%  Pain Max last 24 hours  Not able to report  Pain Min Last 24 hours  Not able to report  Dyspnea Max Last 24 Hours  Not able to report  Dyspnea Min Last 24 hours  Not able to report  Nausea Max Last 24 Hours  Not able to report  Nausea Min Last 24 Hours  Not able to report  Anxiety Max Last 24 Hours  Not able to report  Anxiety Min Last 24 Hours  Not able to report  Other Max Last 24 Hours  Not able to report  Psychosocial & Spiritual Assessment  Palliative Care Outcomes  Patient/Family meeting held?  Yes  Who was at the meeting?  wife  Palliative Care Outcomes  Clarified goals of care, Provided psychosocial or spiritual support, Changed CPR status, Transitioned to hospice, Completed durable DNR  Patient/Family wishes: Interventions discontinued/not started   Mechanical Ventilation, BiPAP, Hemodialysis, Vasopressors, Trach, NIPPV, Tube feedings/TPN, PEG  Palliative  Care follow-up planned  Yes, Facility      Time In: 1200 Time Out: 1315 Time Total: 75 min Greater than 50%  of this  time was spent counseling and coordinating care related to the above assessment and plan. Staffed with Dr. [place] Pink  Signed by: Dory Horn, NP   Please contact Palliative Medicine Team phone at [phone] for questions and concerns.  For individual provider: See Shea Evans

## 2017-06-12 NOTE — Evaluation (Signed)
Occupational Therapy Evaluation Patient Details Name: Rodney Day MRN: [id number] DOB: [birth year] Today's Date: 06/12/2017    History of Present Illness 71 y.o. male came to the emergency room on 06/10/17 due to fall off of the commode at home.  CT negative. Pt has fallen many times at home and had a recent cognitive decline for which he is being followed by Dr. Jannifer [name] (neurologist).  Pt with significant PMH of bladder CA, resection of bladder tumor, ERCP, Cystectomy (bag on right lower quadrant).   Clinical Impression   PT admitted with s/p fall with cognitive deficits. Pt currently with functional limitiations due to the deficits listed below (see OT problem list). PTA was incr confusion/ falls and incr required (A) from family to help with basic transfers/adls.  Pt will benefit from skilled OT to increase their independence and safety with adls and balance to allow discharge SNF. Pt currently requires skilled level care and possible long term placement with cognitive changes. Pt high fall risk and incr fall risk in the PM hours due to night/ day time hours reversed for this patient. Encourage staff to incr arousal in the day and limit sleeping to help promote a normal sleeping pattern in the PM.     Follow Up Recommendations  SNF    Equipment Recommendations  None recommended by OT    Recommendations for Other Services       Precautions / Restrictions Precautions Precautions: Fall Precaution Comments: multiple falls every week Restrictions Weight Bearing Restrictions: No      Mobility Bed Mobility Overal bed mobility: Needs Assistance Bed Mobility: Supine to Sit     Supine to sit: Min assist     General bed mobility comments: mod cues to progress to eob  Transfers Overall transfer level: Needs assistance Equipment used: Rolling walker (2 wheeled) Transfers: Sit to/from Stand Sit to Stand: +2 physical assistance;Min assist         General transfer comment: pt  impulsive and high fall risk     Balance Overall balance assessment: Needs assistance Sitting-balance support: Feet supported;Bilateral upper extremity supported Sitting balance-Leahy Scale: Fair Sitting balance - Comments: needs close supervision EOB due to impulsivity   Standing balance support: Bilateral upper extremity supported Standing balance-Leahy Scale: Poor Standing balance comment: relies on UE on sink level                            ADL either performed or assessed with clinical judgement   ADL Overall ADL's : Needs assistance/impaired   Eating/Feeding Details (indicate cue type and reason): wife reports he ate a big breakfast Grooming: Wash/dry face;Wash/dry hands;Moderate assistance Grooming Details (indicate cue type and reason): due to cueing safety and awareness of fall risk  Upper Body Bathing: Moderate assistance   Lower Body Bathing: Maximal assistance           Toilet Transfer: +2 for physical assistance;Minimal assistance;RW           Functional mobility during ADLs: Minimal assistance;+2 for physical assistance;+2 for safety/equipment;Rolling walker General ADL Comments: Pt demonstrates decr safetyw ith RW picking up , decr safety with turns and needing +2 (A) for balance. pt with knee flexion / buckle with fatigue from static standing     Vision   Additional Comments: noted to use central vision which is consistent with cogntiive changes     Perception     Praxis      Pertinent Vitals/Pain Pain Assessment:  No/denies pain Pain Descriptors / Indicators:  (Pt states his body is sore when initially using UE's/LE's.)     Hand Dominance Right   Extremity/Trunk Assessment Upper Extremity Assessment Upper Extremity Assessment: Overall WFL for tasks assessed   Lower Extremity Assessment Lower Extremity Assessment: Generalized weakness   Cervical / Trunk Assessment Cervical / Trunk Assessment: Normal   Communication  Communication Communication: Other (comment) (mumbling at times, non-sensical)   Cognition Arousal/Alertness: Lethargic Behavior During Therapy: Impulsive;Restless Overall Cognitive Status: Impaired/Different from baseline Area of Impairment: Orientation;Attention;Memory;Following commands;Safety/judgement;Awareness;Problem solving                 Orientation Level: Disoriented to;Place;Time;Situation Current Attention Level: Focused Memory: Decreased recall of precautions;Decreased short-term memory Following Commands: Follows one step commands inconsistently Safety/Judgement: Decreased awareness of safety;Decreased awareness of deficits Awareness: Intellectual Problem Solving: Difficulty sequencing;Requires verbal cues;Requires tactile cues General Comments: pt needs cues for arousal and maintaining eye closer at times. pt following commands but needs help with two step commands. pt asked to wash hands. pt washes then realizes he missed a step and was unable to problem solve opening the screw on top soap without mod auditory / visual cues. pt fatigued at sink level without awareness. Pt needed cues to sit to prevent fall   General Comments  noted to have several wounds noted on patient with no awareness. pt unable to recall information after 5 minutes, 3 minutes. 1 minute. pt consisitently reports "thursday" when asked day of the week    Exercises     Shoulder Instructions      Home Living Family/patient expects to be discharged to:: Skilled nursing facility Living Arrangements: Spouse/significant other;Children Available Help at Discharge: Family;Available 24 hours/day Type of Home: House Home Access: Ramped entrance Entrance Stairs-Number of Steps: 5 Entrance Stairs-Rails: Can reach both Home Layout: Able to live on main level with bedroom/bathroom;Two level         Bathroom Toilet: Standard     Home Equipment: Walker - 2 wheels;Cane - single point;Bedside commode    Additional Comments: Pt's wife reports they are active with [facility] for an aide who comes 3 days per week to sponge bathe him.        Prior Functioning/Environment Level of Independence: Needs assistance  Gait / Transfers Assistance Needed: Family says he refuses to use a walker at home, he pushes past the RW and falls, even with assistance.  Son has started staying with them becasue wife cannot physically handle the husband.  ADL's / Homemaking Assistance Needed: reports need to have (A) for all adls at this time   Comments: family reports more active in the PM ( sun downers) requiring son and wife at times to help redirect him safely        OT Problem List: Decreased strength;Decreased activity tolerance;Impaired balance (sitting and/or standing);Impaired vision/perception;Decreased safety awareness;Decreased knowledge of use of DME or AE;Decreased knowledge of precautions      OT Treatment/Interventions: Self-care/ADL training;Therapeutic exercise;DME and/or AE instruction;Therapeutic activities;Cognitive remediation/compensation;Visual/perceptual remediation/compensation;Patient/family education;Balance training    OT Goals(Current goals can be found in the care plan section) Acute Rehab OT Goals Patient Stated Goal: none stated by patient, wife hopeful for blood work next week with answers OT Goal Formulation: With patient/family Time For Goal Achievement: 06/26/17 Potential to Achieve Goals: Good  OT Frequency: Min 2X/week   Barriers to D/C: Decreased caregiver support          Co-evaluation  AM-PAC PT "6 Clicks" Daily Activity     Outcome Measure Help from another person eating meals?: A Little Help from another person taking care of personal grooming?: A Lot Help from another person toileting, which includes using toliet, bedpan, or urinal?: A Lot Help from another person bathing (including washing, rinsing, drying)?: A Lot Help from another person to put  on and taking off regular upper body clothing?: A Lot Help from another person to put on and taking off regular lower body clothing?: A Lot 6 Click Score: 13   End of Session Equipment Utilized During Treatment: Gait belt Nurse Communication: Mobility status;Precautions  Activity Tolerance: Patient tolerated treatment well Patient left: in chair;with call bell/phone within reach;with chair alarm set;Other (comment) (Associate Professor, alarm and door open to RN staff)  OT Visit Diagnosis: Unsteadiness on feet (R26.81)                Time: 9470-9628 OT Time Calculation (min): 23 min Charges:  OT General Charges $OT Visit: 1 Procedure OT Evaluation $OT Eval Moderate Complexity: 1 Procedure G-Codes: OT G-codes **NOT FOR INPATIENT CLASS** Functional Assessment Tool Used: Clinical judgement Functional Limitation: Self care Self Care Current Status (Z6629): At least 60 percent but less than 80 percent impaired, limited or restricted Self Care Goal Status (U7654): At least 20 percent but less than 40 percent impaired, limited or restricted    Jeri Modena   OTR/L Pager: [phone] Office: [phone] .   Parke Poisson B 06/12/2017, 11:45 AM

## 2017-06-13 LAB — URINE CULTURE: Culture: >=100000 — AB

## 2017-06-13 MED ORDER — MORPHINE SULFATE 10 MG/5ML PO SOLN: 2.5000 mg | ORAL | Status: DC | PRN

## 2017-06-13 MED ORDER — ENSURE ENLIVE PO LIQD: 237.0000 mL | Freq: Two times a day (BID) | ORAL | 12 refills | Status: AC

## 2017-06-13 MED ORDER — HALOPERIDOL LACTATE 2 MG/ML PO CONC
2.0000 mg | Freq: Four times a day (QID) | ORAL | Status: DC | PRN
  Administered 2017-06-14: 2 mg via ORAL
  Filled 2017-06-13 (×2): qty 1

## 2017-06-13 NOTE — Progress Notes (Signed)
Daily Progress Note   Patient Name: Rodney Day       Date: 06/13/2017 DOB: 04/19/1946  Age: 71 y.o. MRN#: [id number] Attending Physician: Bonnielee Haff, MD Primary Care Physician: Burnard Hawthorne, FNP Admit Date: 06/10/2017  Reason for Consultation/Follow-up: Family-Clinician negotiation and Psychosocial/spiritual support  Subjective: Patient has had bites and sips today, overall very lethargic. Met with patient's wife, and his children this afternoon to review disease progression secondary to dementia.  Length of Stay: 1  Current Medications: Scheduled Meds:  . enoxaparin (LOVENOX) injection  40 mg Subcutaneous Q24H  . feeding supplement  1 Container Oral TID BM  . feeding supplement (ENSURE ENLIVE)  237 mL Oral BID BM  . levETIRAcetam  250 mg Oral BID  . predniSONE  60 mg Oral Q breakfast    Continuous Infusions: . sodium chloride 50 mL/hr at 06/13/17 1017    PRN Meds: acetaminophen **OR** acetaminophen, albuterol, ALPRAZolam, haloperidol lactate, hydrOXYzine, ondansetron **OR** ondansetron (ZOFRAN) IV, traZODone  Physical Exam  Constitutional: He appears well-developed and well-nourished.  Cardiovascular: Normal rate.   Pulmonary/Chest:  No increased work of breathing at rest  Neurological:  lethargic  Nursing note and vitals reviewed.           Vital Signs: BP 110/61   Pulse 75   Temp 98.4 F (36.9 C) (Oral)   Resp 16   Ht 6' (1.829 m)   Wt 79.4 kg (175 lb)   SpO2 93%   BMI 23.73 kg/m  SpO2: SpO2: 93 % O2 Device: O2 Device: Not Delivered O2 Flow Rate:    Intake/output summary:  Intake/Output Summary (Last 24 hours) at 06/13/17 1659 Last data filed at 06/13/17 1500  Gross per 24 hour  Intake             1390 ml  Output              930 ml    Net              460 ml   LBM: Last BM Date: 06/11/17 Baseline Weight: Weight: 79.4 kg (175 lb) Most recent weight: Weight: 79.4 kg (175 lb)       Palliative Assessment/Data:    Flowsheet Rows     Most Recent Value  Intake Tab  Referral Department  Hospitalist  Unit  at Time of Referral  Med/Surg Unit  Palliative Care Primary Diagnosis  Neurology  Date Notified  06/12/17  Palliative Care Type  New Palliative care  Reason for referral  Clarify Goals of Care, Counsel Regarding Hospice, Psychosocial or Spiritual support  Date of Admission  06/10/17  Date first seen by Palliative Care  06/12/17  # of days Palliative referral response time  0 Day(s)  # of days IP prior to Palliative referral  2  Clinical Assessment  Palliative Performance Scale Score  30%  Pain Max last 24 hours  Not able to report  Pain Min Last 24 hours  Not able to report  Dyspnea Max Last 24 Hours  Not able to report  Dyspnea Min Last 24 hours  Not able to report  Nausea Max Last 24 Hours  Not able to report  Nausea Min Last 24 Hours  Not able to report  Anxiety Max Last 24 Hours  Not able to report  Anxiety Min Last 24 Hours  Not able to report  Other Max Last 24 Hours  Not able to report  Psychosocial & Spiritual Assessment  Palliative Care Outcomes  Patient/Family meeting held?  Yes  Who was at the meeting?  wife  Palliative Care Outcomes  Clarified goals of care, Provided psychosocial or spiritual support, Changed CPR status, Transitioned to hospice, Completed durable DNR  Patient/Family wishes: Interventions discontinued/not started   Mechanical Ventilation, BiPAP, Hemodialysis, Vasopressors, Trach, NIPPV, Tube feedings/TPN, PEG  Palliative Care follow-up planned  Yes, Facility      Patient Active Problem List   Diagnosis Date Noted  . Lewy body dementia without behavioral disturbance   . Palliative care by specialist   . Fall   . Seizures ([place])   . Hyponatremia 06/10/2017  . Dysphagia  06/10/2017  . AKI (acute kidney injury) ([place])   . Encephalopathy 05/19/2017  . Acute on chronic kidney failure ([place]) 05/19/2017  . Complicated UTI (urinary tract infection) 05/05/2017  . Adenopathy   . Lung mass 04/16/2017  . Cholelithiasis with cholecystitis or obstruction 03/29/2016  . Bladder cancer ([place]) 05/25/2015    Palliative Care Assessment & Plan   Patient Profile: 71 y.o. 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 Patient has had multiple hospitalizations over the past 6 months consisting of altered mental status, falls, UTI.Marland Kitchen  Patient has been very impulsive in the hospital and at one point had a telemetry center but this is been discontinued as he is becoming more lethargic. When awake however he does become agitated, anxious. He verbalizes shortness of breath with any exertion.    Recommendations/Plan:  Transfer to residential hospice, preferably [facility] as soon as bed available. Hospice and palliative care of [facility] have been notified. Family is open to other residential hospices if unable to obtain a bed at [facility] of breath: We'll add as needed low-dose morphine concentrate. Monitor for need for scheduled dosing  Agitation: We'll add low-dose as needed haldol. Monitor for need for scheduled dosing  Goals of Care and Additional Recommendations:  Limitations on Scope of Treatment: Full Comfort Care  Code Status:    Code Status Orders        Start     Ordered   06/12/17 1254  Do not attempt resuscitation (DNR)  Continuous    Question Answer Comment  In the event of cardiac or respiratory ARREST Do  not call a "code blue"   In the event of cardiac or respiratory ARREST Do not perform Intubation, CPR, defibrillation or ACLS   In the event of cardiac or respiratory ARREST Use medication by any route, position,  wound care, and other measures to relive pain and suffering. May use oxygen, suction and manual treatment of airway obstruction as needed for comfort.      06/12/17 1253    Code Status History    Date Active Date Inactive Code Status Order ID Comments User Context   06/10/2017  4:08 PM 06/12/2017 12:52 PM Full Code [id number]  Radene Gunning, NP ED   05/19/2017 11:00 PM 05/23/2017  5:31 PM Full Code [id number]  Lance Coon, MD Inpatient   05/05/2017 10:49 AM 05/08/2017  7:45 PM Full Code [id number]  Loletha Grayer, MD ED   03/29/2016 11:24 AM 03/31/2016  7:25 PM Full Code [id number]  Hubbard Robinson, MD ED    Advance Directive Documentation     Most Recent Value  Type of Advance Directive  Healthcare Power of Attorney  Pre-existing out of facility DNR order (yellow form or pink MOST form)  -  "MOST" Form in Place?  -       Prognosis:   < 2 weeks in the setting of moderate to severe dementia, bladder cancer, sharp functional decline beginning in March as evidenced by 30 pound weight lost, increased sleep, eating only bites and sips; now unable to walk or maintain a seated position without assist, multiple falls, as well as nearing death awareness seeing deceased family members    Discharge Planning:  Hospice facility    Thank you for allowing the Palliative Medicine Team to assist in the care of this patient.   Time In: 1500 Time Out: 1600 Total Time 60 min Prolonged Time Billed  no       Greater than 50%  of this time was spent counseling and coordinating care related to the above assessment and plan.  Dory Horn, NP  Please contact Palliative Medicine Team phone at [phone] for questions and concerns.

## 2017-06-13 NOTE — Progress Notes (Signed)
[facility] PROGRESS NOTE  Rodney Day KVQ:[id number] DOB: 1946/04/17 DOA: 06/10/2017  PCP: Rodney Hawthorne, FNP  Brief History/Interval Summary: 71 y.o.malewith medical history significant bladder cancer status post cystectomy, complete with ureteroileal conduit, hypotension, delayed diagnosed rapidly progressive illness presented to emergency Department chief complaint of frequent falls and continued confusion. Family reports inability to continue to provide care. Plan was initially for placement to a skilled nursing facility. However, patient found to have significant dysphagia with extremely high aspiration risk.  Reason for Visit: Dysphagia  Consultants: Palliative medicine  Procedures: None  Antibiotics: None  Subjective/Interval History: Patient pleasantly confused. Unable to answer any questions.   Objective:  Vital Signs  Vitals:   06/12/17 0540 06/12/17 1432 06/12/17 2128 06/13/17 0655  BP: 105/66 (!) 101/56 (!) 93/53 118/66  Pulse: 63 66 70 73  Resp: 18 18 18 18   Temp:  98.3 F (36.8 C) 97.6 F (36.4 C) 97.6 F (36.4 C)  TempSrc:  Oral Oral Oral  SpO2: 100% 98% 99% 97%  Weight:      Height:        Intake/Output Summary (Last 24 hours) at 06/13/17 0854 Last data filed at 06/13/17 0645  Gross per 24 hour  Intake           858.33 ml  Output              930 ml  Net           -71.67 ml   Filed Weights   06/10/17 0829  Weight: 79.4 kg (175 lb)    General appearance: Awake, alert. Distracted. No distress Resp: Clear to auscultation bilaterally Cardio: S1, S2 is normal, regular. No S3, S4. No rubs, murmurs of breath GI: Soft. Nontender, nondistended. Bowel sounds are present. No masses or organomegaly Neurologic: Awake, alert. Distracted, disoriented. No obvious focal neurological deficits per se  Lab Results:  Data Reviewed: I have personally reviewed following labs and imaging studies  CBC:  Recent Labs Lab 06/10/17 0830    WBC 8.6  HGB 13.5  HCT 39.3  MCV 91.2  PLT 643    Basic Metabolic Panel:  Recent Labs Lab 06/10/17 0830  NA 133*  K 3.5  CL 101  CO2 22  GLUCOSE 94  BUN 22*  CREATININE 1.17  CALCIUM 9.3    GFR: Estimated Creatinine Clearance: 64.5 mL/min (by C-G formula based on SCr of 1.17 mg/dL).  Liver Function Tests:  Recent Labs Lab 06/10/17 0830  AST 32  ALT 92*  ALKPHOS 58  BILITOT 0.5  PROT 5.8*  ALBUMIN 3.4*    CBG:  Recent Labs Lab 06/10/17 0843  GLUCAP 83     Recent Results (from the past 240 hour(s))  Urine culture     Status: Abnormal   Collection Time: 06/10/17 11:13 AM  Result Value Ref Range Status   Specimen Description URINE, CATHETERIZED  Final   Special Requests NONE  Final   Culture (A)  Final    >=100,000 COLONIES/mL STREPTOCOCCUS MITIS/ORALIS >=100,000 COLONIES/mL ENTEROCOCCUS FAECALIS    Report Status 06/13/2017 FINAL  Final   Organism ID, Bacteria ENTEROCOCCUS FAECALIS (A)  Final      Susceptibility   Enterococcus faecalis - MIC*    AMPICILLIN <=2 SENSITIVE Sensitive     LEVOFLOXACIN 2 SENSITIVE Sensitive     NITROFURANTOIN <=16 SENSITIVE Sensitive     VANCOMYCIN 2 SENSITIVE Sensitive     * >=100,000 COLONIES/mL ENTEROCOCCUS FAECALIS      Radiology  Studies: Dg Swallowing Func-speech Pathology  Result Date: 06/12/2017 Objective Swallowing Evaluation: Type of Study: MBS-Modified Barium Swallow Study Patient Details Name: Rodney Day MRN: [id number] Date of Birth: Mar 02, 1946 Today's Date: 06/12/2017 Time: SLP Start Time (ACUTE ONLY): 1033-SLP Stop Time (ACUTE ONLY): 1053 SLP Time Calculation (min) (ACUTE ONLY): 20 min Past Medical History: Past Medical History: Diagnosis Date . Encephalopathy  . Hyponatremia  . Malignant neoplasm of bladder [facility])  Past Surgical History: Past Surgical History: Procedure Laterality Date . ELECTROMAGNETIC NAVIGATION BROCHOSCOPY N/A 05/01/2017  Procedure: ELECTROMAGNETIC NAVIGATION BRONCHOSCOPY;  Surgeon:  Flora Lipps, MD;  Location: [facility] ORS;  Service: Cardiopulmonary;  Laterality: N/A; . ERCP N/A 04/09/2016  Procedure: ENDOSCOPIC RETROGRADE CHOLANGIOPANCREATOGRAPHY (ERCP);  Surgeon: Hulen Luster, MD;  Location: [facility] ENDOSCOPY;  Service: Gastroenterology;  Laterality: N/A; . Pr Cystectomy,Ileal Conduit/Sigmoid Bladder  05/13/2013  CYSTECTOMY, COMPLETE, WITH URETEROILEAL CONDUIT OR SIGMOID BLADDER, INCLUDING INTESTINE ANASTOMOSIS; Surgeon: Ronnald Nian, MD . Pr Remove Pelvis Lymph Nodes  05/13/2013  Procedure: PELVIC LYMPHADENECTOMY W/EXT ILIAC (SEPART PROC); Surgeon: Ronnald Nian, MD . TRANSURETHRAL RESECTION OF BLADDER TUMOR   HPI: 71 y.o. male came to the emergency room on 06/10/17 due to fall off of the commode at home. CT negative. Pt has fallen many times at home and had a recent cognitive decline for which he is being followed by Dr. Jannifer [name] (neurologist). Pt with significant PMH of bladder CA, resection of bladder tumor, ERCP, Cystectomy. Per chart, wife reports patient has frequent coughing when drinking thin liquids. CXR 6/21 no acute cardiopulmonary process seen. Diagnosised with chronic encephalopathy of unknown cause.  No Data Recorded Assessment / Plan / Recommendation CHL IP CLINICAL IMPRESSIONS 06/12/2017 Clinical Impression Pt exhibited a severe sensorimotor pharyngeal phase dysphagia marked by significantly decreased hyolaryngeal anterior forward movement, decreased laryngeal elevation and reduced epiglottic deflection resulting in frank aspiration with thin barium with delayed minimal throat clear and considered mostly silent. Maximum vallecular and pyriform sinsus residue post swallow with nectar. Pt unable to initiate a second cued swallow with verbal stimuli, dry spoon with lingual pressure. After two minutes minutes, SLP placed [place] posterior tongue and extracted barium from valleculae (little to no gag reflex). Presently he is not safe with any food/liquid and difficulty determining  prognosis without clear etiololgy of impairments. Discussed with MD who will explain options to family and consult Palliative care. Will follow up for decision re: nutrition.    SLP Visit Diagnosis Dysphagia, oropharyngeal phase (R13.12) Attention and concentration deficit following -- Frontal lobe and executive function deficit following -- Impact on safety and function Severe aspiration risk   CHL IP TREATMENT RECOMMENDATION 06/12/2017 Treatment Recommendations Therapy as outlined in treatment plan below   Prognosis 06/12/2017 Prognosis for Safe Diet Advancement Fair Barriers to Reach Goals Cognitive deficits;Severity of deficits Barriers/Prognosis Comment -- CHL IP DIET RECOMMENDATION 06/12/2017 SLP Diet Recommendations NPO Liquid Administration via -- Medication Administration Via alternative means Compensations -- Postural Changes --   CHL IP OTHER RECOMMENDATIONS 06/12/2017 Recommended Consults -- Oral Care Recommendations Oral care QID Other Recommendations --   CHL IP FOLLOW UP RECOMMENDATIONS 06/12/2017 Follow up Recommendations (No Data)   CHL IP FREQUENCY AND DURATION 06/12/2017 Speech Therapy Frequency (ACUTE ONLY) min 2x/week Treatment Duration 2 weeks      CHL IP ORAL PHASE 06/12/2017 Oral Phase Impaired Oral - Pudding Teaspoon -- Oral - Pudding Cup -- Oral - Honey Teaspoon -- Oral - Honey Cup -- Oral - Nectar Teaspoon -- Oral - Nectar Cup Decreased bolus cohesion  Oral - Nectar Straw -- Oral - Thin Teaspoon -- Oral - Thin Cup Decreased bolus cohesion Oral - Thin Straw -- Oral - Puree -- Oral - Mech Soft -- Oral - Regular -- Oral - Multi-Consistency -- Oral - Pill -- Oral Phase - Comment --  CHL IP PHARYNGEAL PHASE 06/12/2017 Pharyngeal Phase Impaired Pharyngeal- Pudding Teaspoon -- Pharyngeal -- Pharyngeal- Pudding Cup -- Pharyngeal -- Pharyngeal- Honey Teaspoon -- Pharyngeal -- Pharyngeal- Honey Cup -- Pharyngeal -- Pharyngeal- Nectar Teaspoon -- Pharyngeal -- Pharyngeal- Nectar Cup Pharyngeal residue -  pyriform;Pharyngeal residue - valleculae;Reduced epiglottic inversion;Reduced anterior laryngeal mobility;Delayed swallow initiation-vallecula;Reduced laryngeal elevation Pharyngeal -- Pharyngeal- Nectar Straw -- Pharyngeal -- Pharyngeal- Thin Teaspoon -- Pharyngeal -- Pharyngeal- Thin Cup Penetration/Aspiration during swallow;Moderate aspiration;Pharyngeal residue - valleculae;Pharyngeal residue - pyriform;Reduced anterior laryngeal mobility;Reduced epiglottic inversion;Reduced airway/laryngeal closure;Reduced laryngeal elevation;Delayed swallow initiation-pyriform sinuses Pharyngeal Material enters airway, passes BELOW cords without attempt by patient to eject out (silent aspiration);Material enters airway, CONTACTS cords and not ejected out Pharyngeal- Thin Straw -- Pharyngeal -- Pharyngeal- Puree -- Pharyngeal -- Pharyngeal- Mechanical Soft -- Pharyngeal -- Pharyngeal- Regular -- Pharyngeal -- Pharyngeal- Multi-consistency -- Pharyngeal -- Pharyngeal- Pill -- Pharyngeal -- Pharyngeal Comment --  CHL IP CERVICAL ESOPHAGEAL PHASE 06/12/2017 Cervical Esophageal Phase WFL Pudding Teaspoon -- Pudding Cup -- Honey Teaspoon -- Honey Cup -- Nectar Teaspoon -- Nectar Cup -- Nectar Straw -- Thin Teaspoon -- Thin Cup -- Thin Straw -- Puree -- Mechanical Soft -- Regular -- Multi-consistency -- Pill -- Cervical Esophageal Comment -- CHL IP GO 06/11/2017 Functional Assessment Tool Used skilled clinical judgement Functional Limitations Swallowing Swallow Current Status (G3151) CK Swallow Goal Status (V6160) CJ Swallow Discharge Status (V3710) (None) Motor Speech Current Status (G2694) (None) Motor Speech Goal Status (W5462) (None) Motor Speech Goal Status (V0350) (None) Spoken Language Comprehension Current Status (K9381) (None) Spoken Language Comprehension Goal Status (W2993) (None) Spoken Language Comprehension Discharge Status (Z1696) (None) Spoken Language Expression Current Status (V8938) (None) Spoken Language Expression  Goal Status (B0175) (None) Spoken Language Expression Discharge Status [phone]) (None) Attention Current Status (N2778) (None) Attention Goal Status (E4235) (None) Attention Discharge Status (T6144) (None) Memory Current Status (R1540) (None) Memory Goal Status (G8676) (None) Memory Discharge Status (P9509) (None) Voice Current Status (T2671) (None) Voice Goal Status (I4580) (None) Voice Discharge Status (D9833) (None) Other Speech-Language Pathology Functional Limitation Current Status (A2505) (None) Other Speech-Language Pathology Functional Limitation Goal Status (L9767) (None) Other Speech-Language Pathology Functional Limitation Discharge Status [phone]) (None) [place] Siren 06/12/2017, 4:31 PM  Orbie Pyo Colvin Caroli.Ed CCC-SLP Pager [phone]               Medications:  Scheduled: . enoxaparin (LOVENOX) injection  40 mg Subcutaneous Q24H  . feeding supplement  1 Container Oral TID BM  . feeding supplement (ENSURE ENLIVE)  237 mL Oral BID BM  . levETIRAcetam  250 mg Oral BID  . predniSONE  60 mg Oral Q breakfast   Continuous: . sodium chloride 50 mL/hr at 06/12/17 1335   BDZ:[id number] **OR** acetaminophen, albuterol, ALPRAZolam, haloperidol lactate, hydrOXYzine, ondansetron **OR** ondansetron (ZOFRAN) IV, traZODone  Assessment/Plan:  Principal Problem:   Encephalopathy Active Problems:   Bladder cancer (HCC)   Hyponatremia   Dysphagia   Fall   Seizures ([place])   Lewy body dementia without behavioral disturbance   Palliative care by specialist    Chronic Encephalopathy.  This has been rapidly progressing. Etiology unclear. Urinalysis consistent with urine collected from urostomy bag. Seen recently by urology who does not think  that the patient has active UTI. This is unlikely to be contributing to his mental status changes. As noted previously has had extensive workup for his progressive encephalopathy. This is thought to be rapidly progressive dementia. Seen by neurology as  an outpatient. Normal ammonia level, negative for syphilis, B-12 within the limits of normal. According to neurology progress note EEG done last hospitalization showed triphasic waves. LP was spinal fluid revealed elevated protein and oligoclonal banding. Neurology evaluated last week and opined patient with rapidly progressive dementia. Further blood work done considering entities such as CJD paraneoplastic syndrome, lupus, sarcoidosis. See note dated 06/04/2017 Mini-Mental exam score 19 out of 30. Patient was also recently started on prednisone while waiting for results of these tests. This is being continued. Patient apparently had seizure type activity and so he was started on Keppra as well. Patient was primarily hospitalized because family was unable to take care of patient. Patient seen bytherapy because the patient's wife mentioned coughing episodes with eating and drinking. He underwent modified barium swallow and this showed that the patient has significant dysphagia with silent aspiration and minimal to no gag reflex. Previously patient's neurologist has offered hospice and palliative medicine input. Family was not certain at that point in time. Family agreed and was seen by palliative medicine. He is thought to be appropriate for inpatient/residential hospice. This is being pursued. Comfort feeds.   Significant dysphagia Significant dysphagia noted on modified BM swallow. Mainly silent aspiration. See above. Comfort feeds now.   Mild Hyponatremia Patient was hydrated.  DVT Prophylaxis: Lovenox    Code Status: DO NOT RESUSCITATE  Family Communication: Discussed with the patient's wife and son  Disposition Plan: Await placement to hospice.    LOS: 1 day   [place] Pager [phone] 06/13/2017, 8:54 AM  If 7PM-7AM, please contact night-coverage at [email or web address], password [facility]

## 2017-06-14 MED ORDER — HALOPERIDOL LACTATE 2 MG/ML PO CONC: 2.0000 mg | Freq: Four times a day (QID) | ORAL | 0 refills | Status: AC | PRN

## 2017-06-14 MED ORDER — MORPHINE SULFATE 10 MG/5ML PO SOLN
2.5000 mg | ORAL | 0 refills | Status: AC | PRN
Start: 2017-06-14 — End: ?

## 2017-06-14 NOTE — Progress Notes (Addendum)
Rodney Day to be D/C'd to [facility] per MD order.  Discussed with the patient and all questions fully answered.  VSS, Skin clean, dry and intact without evidence of skin break down, no evidence of skin tears noted. IV catheter discontinued intact per facility request. Site without signs and symptoms of complications. Dressing and pressure applied.  An After Visit Summary was printed and given to the patient's family.   D/c education completed with patient/family including follow up instructions, medication list, d/c activities limitations if indicated, with other d/c instructions as indicated by MD - patient able to verbalize understanding, all questions fully answered.   Patient instructed to return to ED, call 911, or call MD for any changes in condition.   Patient escorted via stretcher, and D/C to [facility] via [place].  Morley Kos Price 06/14/2017 12:55 PM

## 2017-06-14 NOTE — Care Management Note (Signed)
Case Management Note  Patient Details  Name: Rodney Day MRN: [id number] Date of Birth: 08-05-1946  Subjective/Objective:                 Patient will DC to [facility] today as facilitated by CSW.   Action/Plan:   Expected Discharge Date:  06/14/17               Expected Discharge Plan:  [place]  In-House Referral:  Clinical Social Work  Discharge planning Services     Post Acute Care Choice:    Choice offered to:     DME Arranged:    DME Agency:     HH Arranged:    [place] Agency:     Status of Service:  Completed, signed off  If discussed at [employer], dates discussed:    Additional Comments:  Carles Collet, RN 06/14/2017, 11:11 AM

## 2017-06-14 NOTE — Consult Note (Signed)
[facility] room available today for Mr. Foerster. Spouse is aware and agreeable to complete paper work and transfer today. Alisha with PMT aware. Will update CSW.   Please fax discharge summary to [phone].  RN please call report to [phone].  Thank you,  Erling Conte, LCSW [phone]

## 2017-06-14 NOTE — Discharge Summary (Signed)
[facility]  Physician Discharge Summary   Patient ID: Rodney Day MRN: [id number] DOB/AGE: 70/28/1947 70 y.o.  Admit date: 06/10/2017 Discharge date: 06/14/2017  PCP: Burnard Hawthorne, FNP  DISCHARGE DIAGNOSES:  Principal Problem:   Encephalopathy Active Problems:   Bladder cancer ([place])   Hyponatremia   Dysphagia   Fall   Seizures ([place])   Lewy body dementia without behavioral disturbance   Palliative care by specialist   RECOMMENDATIONS FOR OUTPATIENT FOLLOW UP: 1. Patient being discharged to residential hospice   DISCHARGE CONDITION: fair  Diet recommendation: Comfort feeds with dysphagia 3 with honey thick liquids  Filed Weights   06/10/17 0829  Weight: 79.4 kg (175 lb)    INITIAL HISTORY: 71 y.o.malewith medical history significant bladder cancer status post cystectomy, complete with ureteroileal conduit, hypotension, delayed diagnosed rapidly progressive illness presentedto emergency Department chief complaint of frequent falls and continued confusion. Family reports inability to continue to provide care. Plan was initially for placement to a skilled nursing facility. However, patient found to have significant dysphagia with extremely high aspiration risk.  Consultations:  Palliative medicine  Procedures:  None  HOSPITAL COURSE:   Chronic Encephalopathy.  This has been rapidly progressing all the past 3 months. Despite abnormal UA and positive urine culture patient is not thought to have a clinically significant UTI. Urine sample is from his urostomy bag. Seen previously by urology as well. This is unlikely to be contributing to his mental status changes. As noted previously has had extensive workup for his progressive encephalopathy. This is thought to be rapidly progressive dementia. Seen by neurology as an outpatient. Normal ammonia level, negative for syphilis, B-12 within the limits of normal. According to neurology progress note EEG  done last hospitalization showed triphasic waves. LP was spinal fluid revealed elevated protein and oligoclonal banding. Neurology evaluated last week and opined patient with rapidly progressive dementia. Further blood work done considering entities such as CJD paraneoplastic syndrome, lupus, sarcoidosis. See note dated 06/04/2017 Mini-Mental exam score 19 out of 30. Patient was also recently started on prednisone while waiting for results of these tests. This is being continued. Patient apparently had seizure type activity and so he was started on Keppra as well. Patient was primarily hospitalized because family was unable to take care of patient. Patient seen by speech therapy because the patient's wife mentioned coughing episodes with eating and drinking. He underwent modified barium swallow and this showed that the patient has significant dysphagia with silent aspiration and minimal to no gag reflex. Previously patient's neurologist has offered hospice and palliative medicine input. Family was not certain at that point in time. Family agreed and was seen by palliative medicine. He is thought to be appropriate for inpatient/residential hospice.   Significant dysphagia Significant dysphagia noted on modified BM swallow. Mainly silent aspiration. See above. Comfort feeds now.   Patient's prognosis remains poor. Accepted to residential hospice. Of the for discharge today.   PERTINENT LABS:  The results of significant diagnostics from this hospitalization (including imaging, microbiology, ancillary and laboratory) are listed below for reference.    Microbiology: Recent Results (from the past 240 hour(s))  Urine culture     Status: Abnormal   Collection Time: 06/10/17 11:13 AM  Result Value Ref Range Status   Specimen Description URINE, CATHETERIZED  Final   Special Requests NONE  Final   Culture (A)  Final    >=100,000 COLONIES/mL STREPTOCOCCUS MITIS/ORALIS >=100,000 COLONIES/mL ENTEROCOCCUS  FAECALIS    Report Status 06/13/2017 FINAL  Final   Organism ID, Bacteria ENTEROCOCCUS FAECALIS (A)  Final      Susceptibility   Enterococcus faecalis - MIC*    AMPICILLIN <=2 SENSITIVE Sensitive     LEVOFLOXACIN 2 SENSITIVE Sensitive     NITROFURANTOIN <=16 SENSITIVE Sensitive     VANCOMYCIN 2 SENSITIVE Sensitive     * >=100,000 COLONIES/mL ENTEROCOCCUS FAECALIS     Labs: Basic Metabolic Panel:  Recent Labs Lab 06/10/17 0830  NA 133*  K 3.5  CL 101  CO2 22  GLUCOSE 94  BUN 22*  CREATININE 1.17  CALCIUM 9.3   Liver Function Tests:  Recent Labs Lab 06/10/17 0830  AST 32  ALT 92*  ALKPHOS 58  BILITOT 0.5  PROT 5.8*  ALBUMIN 3.4*   CBC:  Recent Labs Lab 06/10/17 0830  WBC 8.6  HGB 13.5  HCT 39.3  MCV 91.2  PLT 228   CBG:  Recent Labs Lab 06/10/17 0843  GLUCAP 83     IMAGING STUDIES Dg Chest 2 View  Result Date: 06/04/2017 CLINICAL DATA:  Chronic confusion and acute onset of seizure like activity. Initial encounter. EXAM: CHEST  2 VIEW COMPARISON:  None. FINDINGS: The lungs are well-aerated and clear. There is no evidence of focal opacification, pleural effusion or pneumothorax. The heart is normal in size; the mediastinal contour is within normal limits. No acute osseous abnormalities are seen. IMPRESSION: No acute cardiopulmonary process seen. Electronically Signed   By: Garald Balding M.D.   On: 06/04/2017 21:37   Dg Chest 2 View  Result Date: 05/19/2017 CLINICAL DATA:  Altered mental status EXAM: CHEST  2 VIEW COMPARISON:  May 05, 2017 FINDINGS: Lungs are clear. Heart size and pulmonary vascularity are normal. No adenopathy. No bone lesions. IMPRESSION: No edema or consolidation. Electronically Signed   By: Lowella Grip III M.D.   On: 05/19/2017 15:17   Ct Head Wo Contrast  Result Date: 06/10/2017 CLINICAL DATA:  Status post fall.  Disoriented. EXAM: CT HEAD WITHOUT CONTRAST TECHNIQUE: Contiguous axial images were obtained from the base of  the skull through the vertex without intravenous contrast. COMPARISON:  None. FINDINGS: Brain: No evidence of acute infarction, hemorrhage, extra-axial collection, ventriculomegaly, or mass effect. Generalized cerebral atrophy. Periventricular white matter low attenuation likely secondary to microangiopathy. Vascular: Cerebrovascular atherosclerotic calcifications are noted. Skull: Negative for fracture or focal lesion. Sinuses/Orbits: Visualized portions of the orbits are unremarkable. Visualized portions of the paranasal sinuses and mastoid air cells are unremarkable. Other: None. IMPRESSION: 1. No acute intracranial pathology. 2. Chronic microvascular disease and cerebral atrophy. Electronically Signed   By: Kathreen Devoid   On: 06/10/2017 12:44   Ct Head Wo Contrast  Result Date: 06/04/2017 CLINICAL DATA:  Altered level of consciousness. Confusion for months. EXAM: CT HEAD WITHOUT CONTRAST TECHNIQUE: Contiguous axial images were obtained from the base of the skull through the vertex without intravenous contrast. COMPARISON:  None. FINDINGS: Brain: Mild generalized atrophy, normal for age. No evidence of acute infarction, hemorrhage, hydrocephalus, extra-axial collection or mass lesion/mass effect. Vascular: Atherosclerosis of skullbase vasculature without hyperdense vessel or abnormal calcification. Skull: Normal. Negative for fracture or focal lesion. Sinuses/Orbits: Paranasal sinuses and mastoid air cells are clear. The visualized orbits are unremarkable. Other: None. IMPRESSION: No acute intracranial abnormality or explanation for altered level of consciousness. Electronically Signed   By: Jeb Levering M.D.   On: 06/04/2017 22:03   Dg Swallowing Func-speech Pathology  Result Date: 06/12/2017 Objective Swallowing Evaluation: Type of Study: MBS-Modified Barium Swallow  Study Patient Details Name: Rodney Day MRN: [id number] Date of Birth: 23-Apr-1946 Today's Date: 06/12/2017 Time: SLP Start Time  (ACUTE ONLY): 1033-SLP Stop Time (ACUTE ONLY): 1053 SLP Time Calculation (min) (ACUTE ONLY): 20 min Past Medical History: Past Medical History: Diagnosis Date . Encephalopathy  . Hyponatremia  . Malignant neoplasm of bladder [facility])  Past Surgical History: Past Surgical History: Procedure Laterality Date . ELECTROMAGNETIC NAVIGATION BROCHOSCOPY N/A 05/01/2017  Procedure: ELECTROMAGNETIC NAVIGATION BRONCHOSCOPY;  Surgeon: Flora Lipps, MD;  Location: [facility] ORS;  Service: Cardiopulmonary;  Laterality: N/A; . ERCP N/A 04/09/2016  Procedure: ENDOSCOPIC RETROGRADE CHOLANGIOPANCREATOGRAPHY (ERCP);  Surgeon: Hulen Luster, MD;  [facility] END[facility];  Service: Gastroenterology;  Laterality: N/A; . Pr Cystectomy,Ileal Conduit/Sigmoid Bladder  05/13/2013  CYSTECTOMY, COMPLETE, WITH URETEROILEAL CONDUIT OR SIGMOID BLADDER, INCLUDING INTESTINE ANASTOMOSIS; Surgeon: Ronnald Nian, MD . Pr Remove Pelvis Lymph Nodes  05/13/2013  Procedure: PELVIC LYMPHADENECTOMY W/EXT ILIAC (SEPART PROC); Surgeon: Ronnald Nian, MD . TRANSURETHRAL RESECTION OF BLADDER TUMOR   HPI: 71 y.o. male came to the emergency room on 06/10/17 due to fall off of the commode at home. CT negative. Pt has fallen many times at home and had a recent cognitive decline for which he is being followed by Dr. Jannifer [name] (neurologist). Pt with significant PMH of bladder CA, resection of bladder tumor, ERCP, Cystectomy. Per chart, wife reports patient has frequent coughing when drinking thin liquids. CXR 6/21 no acute cardiopulmonary process seen. Diagnosised with chronic encephalopathy of unknown cause.  No Data Recorded Assessment / Plan / Recommendation CHL IP CLINICAL IMPRESSIONS 06/12/2017 Clinical Impression Pt exhibited a severe sensorimotor pharyngeal phase dysphagia marked by significantly decreased hyolaryngeal anterior forward movement, decreased laryngeal elevation and reduced epiglottic deflection resulting in frank aspiration with thin barium with delayed minimal  throat clear and considered mostly silent. Maximum vallecular and pyriform sinsus residue post swallow with nectar. Pt unable to initiate a second cued swallow with verbal stimuli, dry spoon with lingual pressure. After two minutes minutes, SLP placed [place] posterior tongue and extracted barium from valleculae (little to no gag reflex). Presently he is not safe with any food/liquid and difficulty determining prognosis without clear etiololgy of impairments. Discussed with MD who will explain options to family and consult Palliative care. Will follow up for decision re: nutrition.    SLP Visit Diagnosis Dysphagia, oropharyngeal phase (R13.12) Attention and concentration deficit following -- Frontal lobe and executive function deficit following -- Impact on safety and function Severe aspiration risk   CHL IP TREATMENT RECOMMENDATION 06/12/2017 Treatment Recommendations Therapy as outlined in treatment plan below   Prognosis 06/12/2017 Prognosis for Safe Diet Advancement Fair Barriers to Reach Goals Cognitive deficits;Severity of deficits Barriers/Prognosis Comment -- CHL IP DIET RECOMMENDATION 06/12/2017 SLP Diet Recommendations NPO Liquid Administration via -- Medication Administration Via alternative means Compensations -- Postural Changes --   CHL IP OTHER RECOMMENDATIONS 06/12/2017 Recommended Consults -- Oral Care Recommendations Oral care QID Other Recommendations --   CHL IP FOLLOW UP RECOMMENDATIONS 06/12/2017 Follow up Recommendations (No Data)   CHL IP FREQUENCY AND DURATION 06/12/2017 Speech Therapy Frequency (ACUTE ONLY) min 2x/week Treatment Duration 2 weeks      CHL IP ORAL PHASE 06/12/2017 Oral Phase Impaired Oral - Pudding Teaspoon -- Oral - Pudding Cup -- Oral - Honey Teaspoon -- Oral - Honey Cup -- Oral - Nectar Teaspoon -- Oral - Nectar Cup Decreased bolus cohesion Oral - Nectar Straw -- Oral - Thin Teaspoon -- Oral - Thin Cup Decreased bolus cohesion Oral -  Thin Straw -- Oral - Puree -- Oral - Mech Soft  -- Oral - Regular -- Oral - Multi-Consistency -- Oral - Pill -- Oral Phase - Comment --  CHL IP PHARYNGEAL PHASE 06/12/2017 Pharyngeal Phase Impaired Pharyngeal- Pudding Teaspoon -- Pharyngeal -- Pharyngeal- Pudding Cup -- Pharyngeal -- Pharyngeal- Honey Teaspoon -- Pharyngeal -- Pharyngeal- Honey Cup -- Pharyngeal -- Pharyngeal- Nectar Teaspoon -- Pharyngeal -- Pharyngeal- Nectar Cup Pharyngeal residue - pyriform;Pharyngeal residue - valleculae;Reduced epiglottic inversion;Reduced anterior laryngeal mobility;Delayed swallow initiation-vallecula;Reduced laryngeal elevation Pharyngeal -- Pharyngeal- Nectar Straw -- Pharyngeal -- Pharyngeal- Thin Teaspoon -- Pharyngeal -- Pharyngeal- Thin Cup Penetration/Aspiration during swallow;Moderate aspiration;Pharyngeal residue - valleculae;Pharyngeal residue - pyriform;Reduced anterior laryngeal mobility;Reduced epiglottic inversion;Reduced airway/laryngeal closure;Reduced laryngeal elevation;Delayed swallow initiation-pyriform sinuses Pharyngeal Material enters airway, passes BELOW cords without attempt by patient to eject out (silent aspiration);Material enters airway, CONTACTS cords and not ejected out Pharyngeal- Thin Straw -- Pharyngeal -- Pharyngeal- Puree -- Pharyngeal -- Pharyngeal- Mechanical Soft -- Pharyngeal -- Pharyngeal- Regular -- Pharyngeal -- Pharyngeal- Multi-consistency -- Pharyngeal -- Pharyngeal- Pill -- Pharyngeal -- Pharyngeal Comment --  CHL IP CERVICAL ESOPHAGEAL PHASE 06/12/2017 Cervical Esophageal Phase WFL Pudding Teaspoon -- Pudding Cup -- Honey Teaspoon -- Honey Cup -- Nectar Teaspoon -- Nectar Cup -- Nectar Straw -- Thin Teaspoon -- Thin Cup -- Thin Straw -- Puree -- Mechanical Soft -- Regular -- Multi-consistency -- Pill -- Cervical Esophageal Comment -- CHL IP GO 06/11/2017 Functional Assessment Tool Used skilled clinical judgement Functional Limitations Swallowing Swallow Current Status (L8921) CK Swallow Goal Status (J9417) CJ Swallow Discharge  Status (E0814) (None) Motor Speech Current Status (G8185) (None) Motor Speech Goal Status (U3149) (None) Motor Speech Goal Status (F0263) (None) Spoken Language Comprehension Current Status (Z8588) (None) Spoken Language Comprehension Goal Status (F0277) (None) Spoken Language Comprehension Discharge Status (A1287) (None) Spoken Language Expression Current Status (O6767) (None) Spoken Language Expression Goal Status (M0947) (None) Spoken Language Expression Discharge Status [id number]) (None) Attention Current Status (Z6629) (None) Attention Goal Status (U7654) (None) Attention Discharge Status (Y5035) (None) Memory Current Status (W6568) (None) Memory Goal Status (L2751) (None) Memory Discharge Status (Z0017) (None) Voice Current Status (C9449) (None) Voice Goal Status (Q7591) (None) Voice Discharge Status (M3846) (None) Other Speech-Language Pathology Functional Limitation Current Status (K5993) (None) Other Speech-Language Pathology Functional Limitation Goal Status (T7017) (None) Other Speech-Language Pathology Functional Limitation Discharge Status [phone]) (None) [place] Siren 06/12/2017, 4:31 PM  Orbie Pyo Colvin Caroli.Ed CCC-SLP Pager [phone]             Ct Renal Stone Study  Result Date: 05/19/2017 CLINICAL DATA:  UTI EXAM: CT ABDOMEN AND PELVIS WITHOUT CONTRAST TECHNIQUE: Multidetector CT imaging of the abdomen and pelvis was performed following the standard protocol without IV contrast. COMPARISON:  04/21/2017, 04/14/2017 FINDINGS: Lower chest: Previously noted cavitary lesion in the left lower lobe is not identified. Linear scarring in the left lower lobe. No pleural effusion or acute consolidation. Coronary artery calcifications. Trace pericardial effusion or thickening. Hepatobiliary: Calcified gallstones. No biliary dilatation. No focal hepatic abnormality Pancreas: Unremarkable. No pancreatic ductal dilatation or surrounding inflammatory changes. Spleen: Normal in size without focal abnormality.  Adrenals/Urinary Tract: Diffuse thickening of the adrenal glands without discrete mass. Atrophic right kidney. No hydronephrosis. Status post cystectomy with right lower quadrant ileal conduit. Stomach/Bowel: Stomach nonenlarged. No dilated small bowel. Large amount of stool in the colon. No colon wall thickening. Normal appendix. Postsurgical changes of right lower quadrant small bowel. Right lower quadrant ileostomy with parastomal hernia containing fat. Vascular/Lymphatic: Focal ectasia  of the distal abdominal aorta measuring 2.8 cm. No significantly enlarged pelvic lymph nodes. Reproductive: Status post prostatectomy Other: No free air or free fluid. Moderate fat in the periumbilical region Musculoskeletal: Degenerative changes.  No acute osseus abnormality. IMPRESSION: 1. Status post cystoprostatectomy with right lower quadrant ileal conduit. Kidneys demonstrate no hydronephrosis or calcified stones. The right kidney is atrophic. 2. Large volume of stool in the colon 3. Calcified gallstones 4. Ectasia of the infrarenal abdominal aorta a distally measuring up to 2.8 cm. Ectatic abdominal aorta at risk for aneurysm development. Recommend followup by ultrasound in 5 years. This recommendation follows ACR consensus guidelines: White Paper of the ACR Incidental Findings Committee II on Vascular Findings. J Am Coll Radiol 2013; 10:789-794. 5. Previously noted left lower lobe cavitary lesion no longer visualized 6. Periumbilical and parastomal fat containing hernias. Electronically Signed   By: Donavan Foil M.D.   On: 05/19/2017 17:49    DISCHARGE EXAMINATION: Vitals:   06/13/17 0655 06/13/17 1541 06/13/17 2127 06/14/17 0512  BP: 118/66 110/61 (!) 105/53 129/68  Pulse: 73 75 66 (!) 59  Resp: 18 16 18 18   Temp: 97.6 F (36.4 C) 98.4 F (36.9 C) 98.3 F (36.8 C)   TempSrc: Oral Oral    SpO2: 97% 93% 98% 95%  Weight:      Height:       General appearance: alert, appears stated age, distracted and no  distress Resp: clear to auscultation bilaterally Cardio: regular rate and rhythm, S1, S2 normal, no murmur, click, rub or gallop GI: soft, non-tender; bowel sounds normal; no masses,  no organomegaly Neurologic: He is awake, alert. Does not answer any questions.  DISPOSITION: Residential hospice  Discharge Instructions    Call MD for:  extreme fatigue    Complete by:  As directed    Call MD for:  persistant dizziness or light-headedness    Complete by:  As directed    Call MD for:  persistant nausea and vomiting    Complete by:  As directed    Call MD for:  severe uncontrolled pain    Complete by:  As directed    Call MD for:  temperature >100.4    Complete by:  As directed    Discharge instructions    Complete by:  As directed    Please see instructions on the discharge summary.  You were cared for by a hospitalist during your hospital stay. If you have any questions about your discharge medications or the care you received while you were in the hospital after you are discharged, you can call the unit and asked to speak with the hospitalist on call if the hospitalist that took care of you is not available. Once you are discharged, your primary care physician will handle any further medical issues. Please note that NO REFILLS for any discharge medications will be authorized once you are discharged, as it is imperative that you return to your primary care physician (or establish a relationship with a primary care physician if you do not have one) for your aftercare needs so that they can reassess your need for medications and monitor your lab values. If you do not have a primary care physician, you can call [phone] for a physician referral.   Increase activity slowly    Complete by:  As directed       ALLERGIES: No Known Allergies   Current Discharge Medication List    START taking these medications   Details  acetaminophen (  TYLENOL) 325 MG tablet Take 2 tablets (650 mg total) by  mouth every 6 (six) hours as needed for mild pain (or Fever >/= 101).    !! feeding supplement, ENSURE ENLIVE, (ENSURE ENLIVE) LIQD Take 237 mLs by mouth 2 (two) times daily between meals. Qty: 237 mL, Refills: 12    haloperidol (HALDOL) 2 MG/ML solution Take 1 mL (2 mg total) by mouth every 6 (six) hours as needed for agitation. Refills: 0    morphine 10 MG/5ML solution Take 1.3-2.5 mLs (2.6-5 mg total) by mouth every 4 (four) hours as needed for severe pain (dyspnea). Refills: 0     !! - Potential duplicate medications found. Please discuss with provider.    CONTINUE these medications which have CHANGED   Details  ALPRAZolam (XANAX) 0.5 MG tablet Take 1 tablet (0.5 mg total) by mouth at bedtime as needed for anxiety. Qty: 15 tablet, Refills: 0      CONTINUE these medications which have NOT CHANGED   Details  !! feeding supplement (BOOST HIGH PROTEIN) LIQD Take 1 Container by mouth 3 (three) times daily between meals.    Glycopyrrolate-Formoterol (BEVESPI AEROSPHERE) 9-4.8 MCG/ACT AERO Inhale 2 puffs into the lungs 2 (two) times daily. Qty: 10.7 g, Refills: 5    levETIRAcetam (KEPPRA) 250 MG tablet Take 1 tablet (250 mg total) by mouth 2 (two) times daily. Qty: 60 tablet, Refills: 3    Multiple Vitamin (MULTIVITAMIN WITH MINERALS) TABS tablet Take 1 tablet by mouth daily.    predniSONE (DELTASONE) 20 MG tablet Take 3 tablets (60 mg total) by mouth daily. Qty: 90 tablet, Refills: 3    albuterol (PROVENTIL HFA;VENTOLIN HFA) 108 (90 Base) MCG/ACT inhaler Inhale 2 puffs into the lungs every 4 (four) hours as needed for wheezing or shortness of breath. Qty: 1 Inhaler, Refills: 6    ibuprofen (ADVIL,MOTRIN) 200 MG tablet Take 400 mg by mouth every 8 (eight) hours as needed for mild pain.    Associated Diagnoses: Malignant neoplasm of urinary bladder, unspecified site [facility])     !! - Potential duplicate medications found. Please discuss with provider.       Follow-up Information     Kathrynn Ducking, MD. Call.   Specialty:  Neurology Why:  as needed Contact information: [address] [place] [address] [phone]           TOTAL DISCHARGE TIME: [facility] Pager [phone]  06/14/2017, 10:57 AM

## 2017-06-14 NOTE — Progress Notes (Signed)
Clinical Social Worker assisted with patient discharge including contacting patient family and facility to confirm patient discharge plans.  Clinical information faxed to facility and family agreeable with plan.  CSW arranged ambulance transport via [facility] to [facility].  RN to call [phone] report prior to discharge.  Clinical Social Worker will sign off for now as social work intervention is no longer needed. Please consult [place] again if new need arises.  Rhea Pink, MSW, [place]

## 2017-06-15 ENCOUNTER — Telehealth: Payer: Self-pay | Admitting: Neurology

## 2017-06-15 ENCOUNTER — Ambulatory Visit: Payer: Medicare Other

## 2017-06-15 NOTE — Telephone Encounter (Signed)
Pt wife calling to inform that pt is now at [facility] [phone] Wife told pt has about 2 weeks to live and she is asking if Dr Jannifer [name] is able to go over and review chart, she would like Dr Tobey Grim opinion on status, please call

## 2017-06-15 NOTE — Telephone Encounter (Signed)
I called the patient. The patient is now on [facility]. The patient has severe dysphagia problems, not expected to live long.  I discussed the blood work results with the wife, the only thing that is pending is the paraneoplastic antibodies. Patient does have a positive ANA antibody panel shows a single low-grade elevation. The patient does have a monoclonal antibody with IgG.  I will call her with the full level work results.

## 2017-06-16 ENCOUNTER — Telehealth: Payer: Self-pay | Admitting: Neurology

## 2017-06-16 ENCOUNTER — Telehealth: Payer: Self-pay | Admitting: *Deleted

## 2017-06-16 LAB — MULTIPLE MYELOMA PANEL, SERUM
ALPHA2 GLOB SERPL ELPH-MCNC: 0.9 g/dL (ref 0.4–1.0)
Albumin SerPl Elph-Mcnc: 4.2 g/dL (ref 2.9–4.4)
Albumin/Glob SerPl: 1.5 (ref 0.7–1.7)
Alpha 1: 0.2 g/dL (ref 0.0–0.4)
B-Globulin SerPl Elph-Mcnc: 1 g/dL (ref 0.7–1.3)
Gamma Glob SerPl Elph-Mcnc: 0.8 g/dL (ref 0.4–1.8)
Globulin, Total: 2.9 g/dL (ref 2.2–3.9)
IGA/IMMUNOGLOBULIN A, SERUM: 251 mg/dL (ref 61–437)
IGG (IMMUNOGLOBIN G), SERUM: 817 mg/dL (ref 700–1600)
IGM (IMMUNOGLOBULIN M), SRM: 81 mg/dL (ref 20–172)
M Protein SerPl Elph-Mcnc: 0.2 g/dL — ABNORMAL HIGH
TOTAL PROTEIN: 7.1 g/dL (ref 6.0–8.5)

## 2017-06-16 LAB — SEDIMENTATION RATE: SED RATE: 6 mm/h (ref 0–30)

## 2017-06-16 LAB — TSH: TSH: 2.07 u[IU]/mL (ref 0.450–4.500)

## 2017-06-16 LAB — B. BURGDORFI ANTIBODIES

## 2017-06-16 LAB — ANTI-HU ABS (WESTERN BLOT): ANTI-HU ABS (WESTERN BLOT): POSITIVE — AB

## 2017-06-16 LAB — PARANEOPLASTIC PROFILE 1
Neuronal Nuc Ab (Ri), IFA: <1:10 {titer}
Purkinje Cell (Yo) Autoantobodies- IFA: <1:10 {titer}

## 2017-06-16 LAB — ENA+DNA/DS+SJORGEN'S
ENA RNP AB: 2.9 AI — AB (ref 0.0–0.9)
dsDNA Ab: 1 IU/mL (ref 0–9)

## 2017-06-16 LAB — ANA W/REFLEX: ANA: POSITIVE — AB

## 2017-06-16 LAB — THYROGLOBULIN ANTIBODY: Thyroglobulin Antibody: <1 IU/mL (ref 0.0–0.9)

## 2017-06-16 LAB — ANTISTREPTOLYSIN O TITER

## 2017-06-16 LAB — ANGIOTENSIN CONVERTING ENZYME: ANGIO CONVERT ENZYME: 43 U/L (ref 14–82)

## 2017-06-16 LAB — RHEUMATOID FACTOR: Rhuematoid fact SerPl-aCnc: <10 IU/mL (ref 0.0–13.9)

## 2017-06-16 LAB — COPPER, SERUM: COPPER: 109 ug/dL (ref 72–166)

## 2017-06-16 LAB — THYROID PEROXIDASE ANTIBODY: Thyroperoxidase Ab SerPl-aCnc: 11 IU/mL (ref 0–34)

## 2017-06-16 LAB — HIV ANTIBODY (ROUTINE TESTING W REFLEX): HIV SCREEN 4TH GENERATION: NONREACTIVE

## 2017-06-16 NOTE — Telephone Encounter (Signed)
I called the wife. 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 This may result in gait ataxia and a rapidly progressive dementia, similar to his clinical presentation.  I discussed this with the wife, this antibody has a high association with a small cell carcinoma of the lung.  Decision has already been made for hospice, I will send the blood work results to the wife.

## 2017-06-16 NOTE — Telephone Encounter (Signed)
Son calling for status on the completion of the FMLA paperwork, it is needed asap.  Please call

## 2017-06-16 NOTE — Telephone Encounter (Signed)
Form faxed to [place] on 06/16/17

## 2017-06-18 DIAGNOSIS — Z0289 Encounter for other administrative examinations: Secondary | ICD-10-CM

## 2017-06-29 ENCOUNTER — Ambulatory Visit: Payer: Self-pay | Admitting: Neurology

## 2017-07-06 ENCOUNTER — Ambulatory Visit: Payer: Medicare Other | Admitting: Neurology

## 2017-07-15 DEATH — deceased

## 2017-09-18 NOTE — Telephone Encounter (Signed)
ERROR

## 2017-11-27 ENCOUNTER — Other Ambulatory Visit: Payer: Medicare Other

## 2017-11-27 ENCOUNTER — Ambulatory Visit: Payer: Medicare Other | Admitting: Oncology

## 2018-07-29 IMAGING — CT CT HEAD W/O CM
4 series · 15 of 47 positions shown, 17 images · non-contrast
Comparison: None.

CLINICAL DATA: Altered level of consciousness. Confusion for
months.

EXAM:
CT HEAD WITHOUT CONTRAST
TECHNIQUE: Contiguous axial images were obtained from the base of the skull
through the vertex without intravenous contrast.

[Series 3: head without · axial · non-contrast · 0.49mm/px · z∈[-111,+14]mm · 7 of 35 slices shown, 9 images]
[im 5/35  brain]
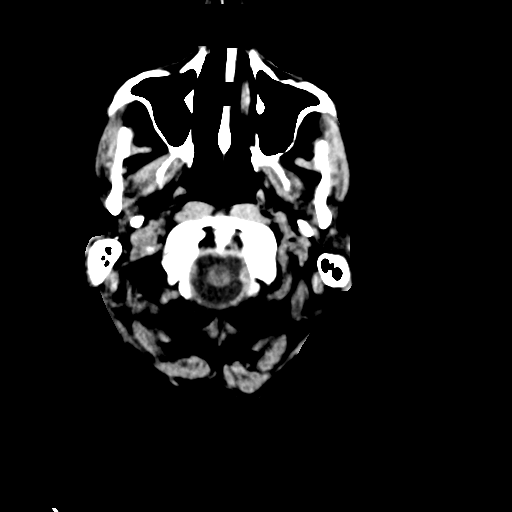
[im 5/35  bone]
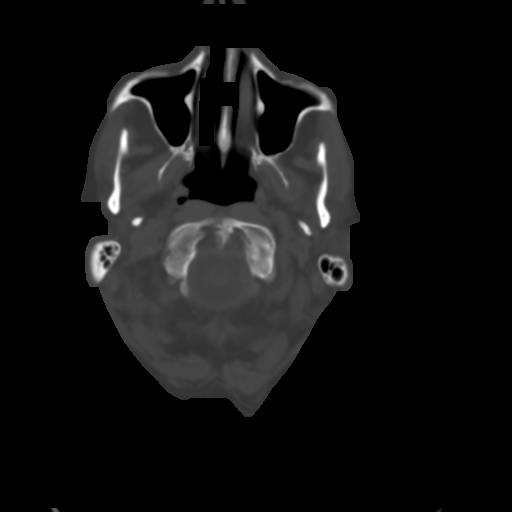
[im 9/35  brain]
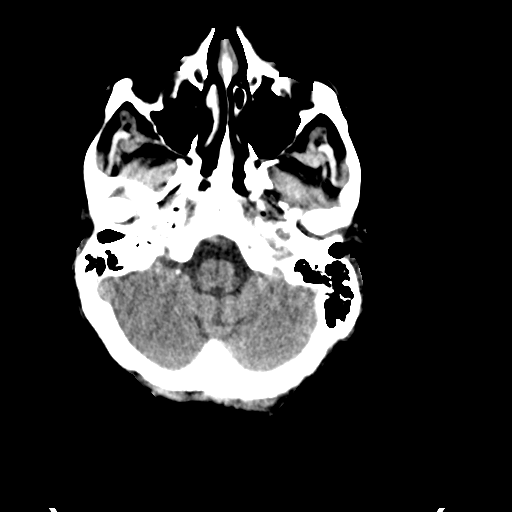
[im 13/35  brain]
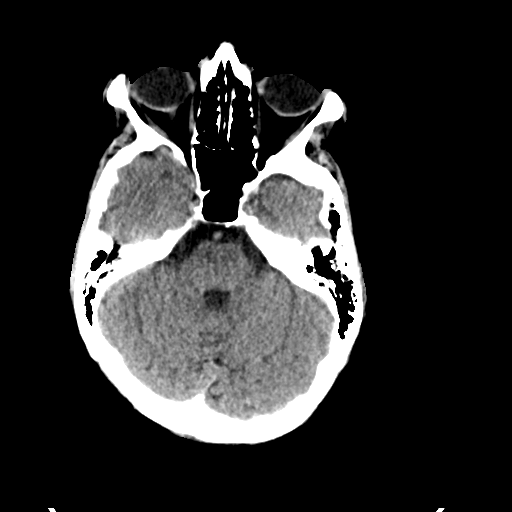
[im 18/35  brain]
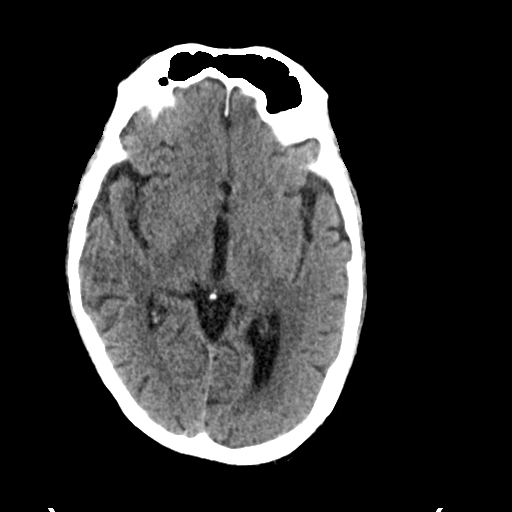
[im 22/35  brain]
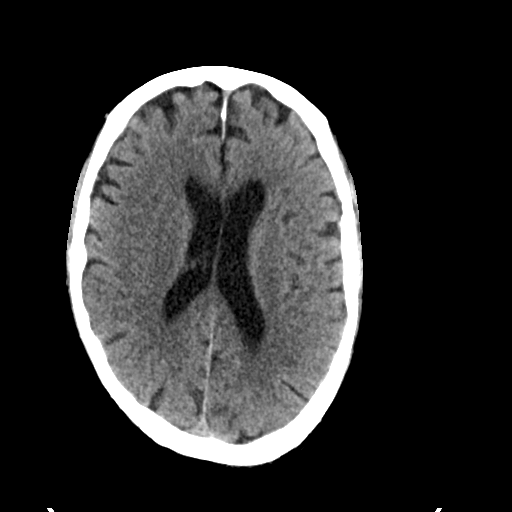
[im 22/35  bone]
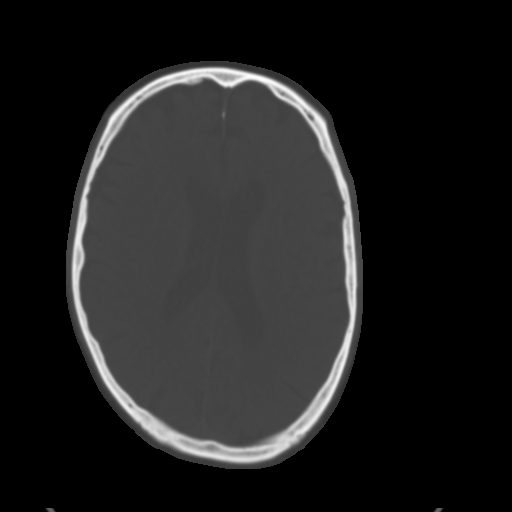
[im 26/35  brain]
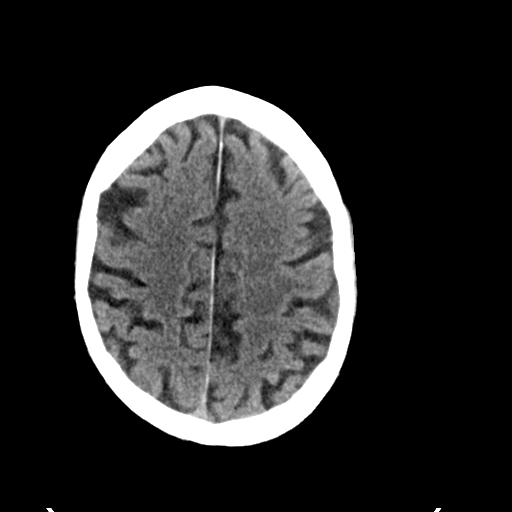
[im 30/35  brain]
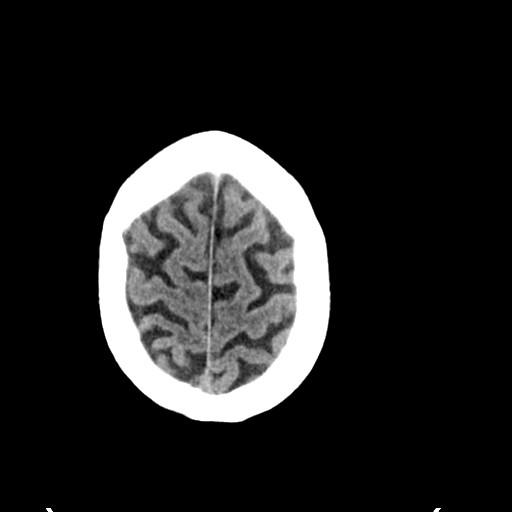

[Series 4: head bone · axial · 0.49mm/px · z∈[-115,-97]mm · 2 of 87 slices shown]
[im 9/87  bone]
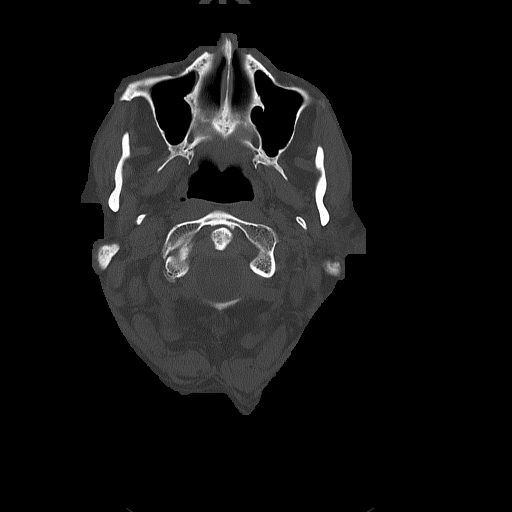
[im 18/87  bone]
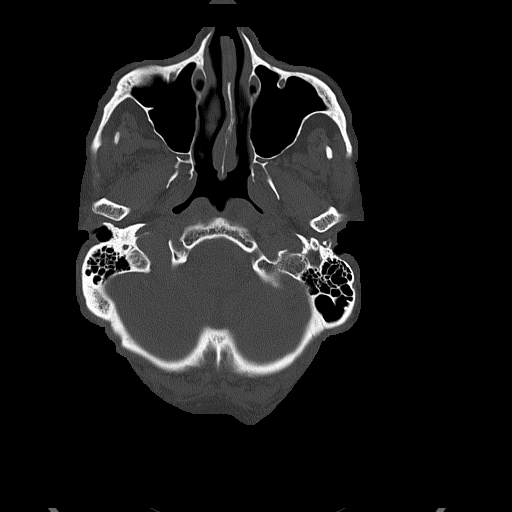

[Series 5: head without cor · coronal · non-contrast · 0.34mm/px · 3 of 73 slices shown]
[im 25/73  brain]
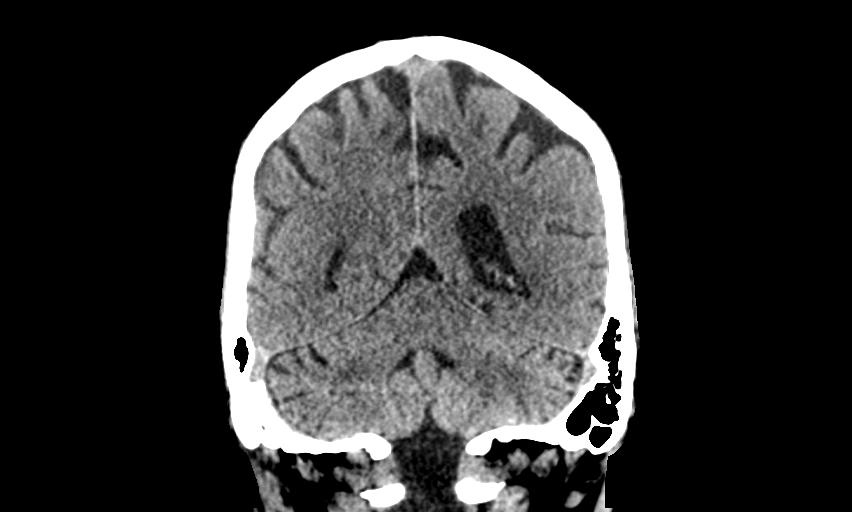
[im 33/73  brain]
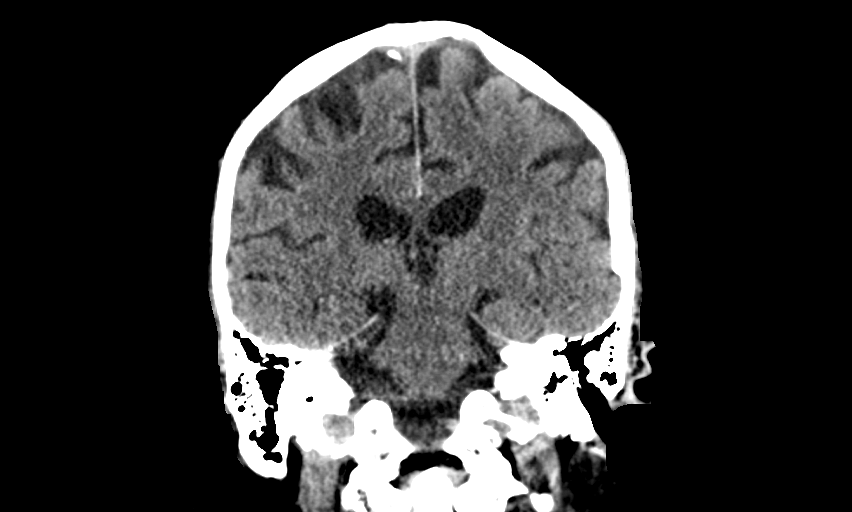
[im 41/73  brain]
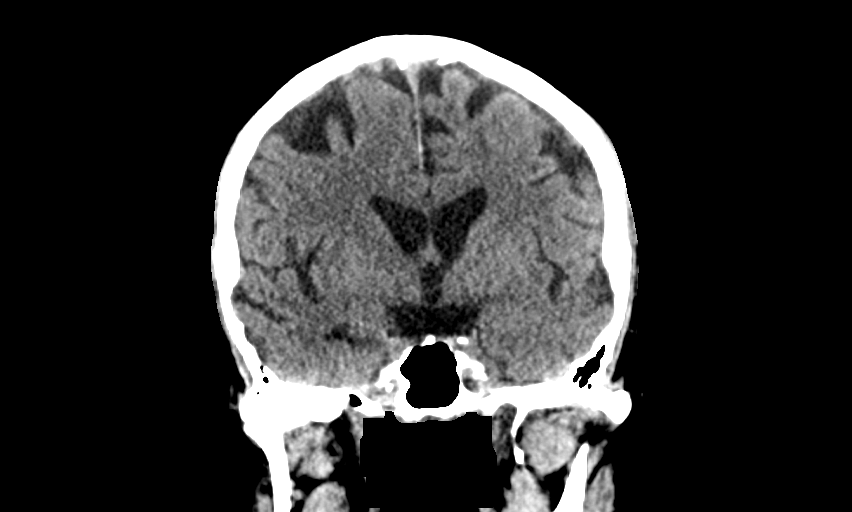

[Series 6: head without sag · sagittal · non-contrast · 0.41mm/px · 3 of 64 slices shown]
[im 22/64  brain]
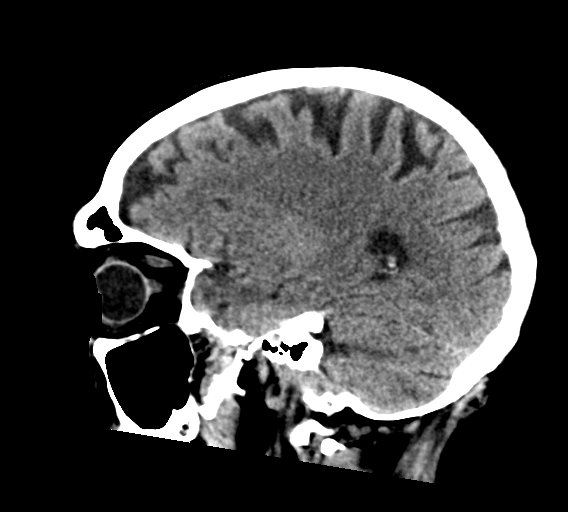
[im 32/64  brain]
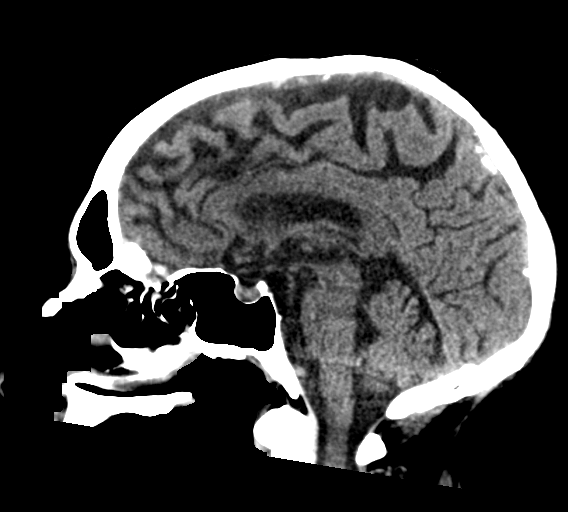
[im 43/64  brain]
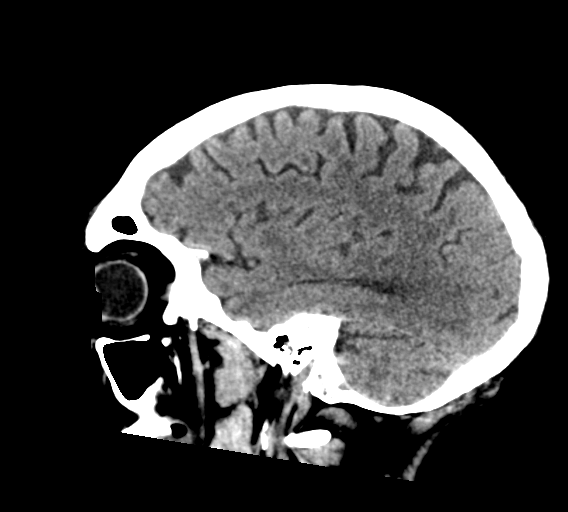

[15 of 47 positions shown; findings below may reference images not displayed]

FINDINGS: Brain: Mild generalized atrophy, normal for age. No evidence of
acute infarction, hemorrhage, hydrocephalus, extra-axial collection
or mass lesion/mass effect.

Vascular: Atherosclerosis of skullbase vasculature without
hyperdense vessel or abnormal calcification.

Skull: Normal. Negative for fracture or focal lesion.

Sinuses/Orbits: Paranasal sinuses and mastoid air cells are clear.
The visualized orbits are unremarkable.

Other: None.
IMPRESSION: No acute intracranial abnormality or explanation for altered level
of consciousness.
